# Patient Record
Sex: Female | Born: 1935
Health system: Southern US, Community
[De-identification: ages and names within clinical notes are randomized; demographics above are authoritative.]

## PROBLEM LIST (undated history)

## (undated) DIAGNOSIS — I639 Cerebral infarction, unspecified: Secondary | ICD-10-CM

## (undated) DIAGNOSIS — M1711 Unilateral primary osteoarthritis, right knee: Secondary | ICD-10-CM

## (undated) DIAGNOSIS — F0390 Unspecified dementia without behavioral disturbance: Secondary | ICD-10-CM

## (undated) DIAGNOSIS — I10 Essential (primary) hypertension: Secondary | ICD-10-CM

## (undated) DIAGNOSIS — M1990 Unspecified osteoarthritis, unspecified site: Secondary | ICD-10-CM

## (undated) DIAGNOSIS — Q2112 Patent foramen ovale: Secondary | ICD-10-CM

## (undated) DIAGNOSIS — Q211 Atrial septal defect: Secondary | ICD-10-CM

## (undated) HISTORY — PX: CARPAL TUNNEL RELEASE: SHX101

## (undated) HISTORY — PX: BREAST SURGERY: SHX581

## (undated) HISTORY — PX: KNEE ARTHROSCOPY: SUR90

---

## 1999-08-30 ENCOUNTER — Ambulatory Visit (HOSPITAL_COMMUNITY): Admission: RE | Admit: 1999-08-30 | Discharge: 1999-08-30 | Payer: Self-pay | Admitting: Internal Medicine

## 1999-08-30 ENCOUNTER — Encounter: Payer: Self-pay | Admitting: Obstetrics and Gynecology

## 1999-09-30 ENCOUNTER — Other Ambulatory Visit: Admission: RE | Admit: 1999-09-30 | Discharge: 1999-09-30 | Payer: Self-pay | Admitting: Obstetrics and Gynecology

## 1999-10-11 ENCOUNTER — Encounter: Admission: RE | Admit: 1999-10-11 | Discharge: 1999-10-11 | Payer: Self-pay | Admitting: Obstetrics and Gynecology

## 1999-10-11 ENCOUNTER — Encounter: Payer: Self-pay | Admitting: Obstetrics and Gynecology

## 2002-03-24 ENCOUNTER — Encounter: Payer: Self-pay | Admitting: Internal Medicine

## 2002-03-24 ENCOUNTER — Ambulatory Visit (HOSPITAL_COMMUNITY): Admission: RE | Admit: 2002-03-24 | Discharge: 2002-03-24 | Payer: Self-pay | Admitting: Internal Medicine

## 2002-04-01 ENCOUNTER — Encounter: Payer: Self-pay | Admitting: Internal Medicine

## 2002-04-01 ENCOUNTER — Encounter: Admission: RE | Admit: 2002-04-01 | Discharge: 2002-04-01 | Payer: Self-pay | Admitting: Internal Medicine

## 2003-01-10 ENCOUNTER — Inpatient Hospital Stay (HOSPITAL_COMMUNITY): Admission: EM | Admit: 2003-01-10 | Discharge: 2003-01-11 | Payer: Self-pay | Admitting: *Deleted

## 2003-01-10 ENCOUNTER — Encounter: Payer: Self-pay | Admitting: *Deleted

## 2004-03-11 ENCOUNTER — Ambulatory Visit (HOSPITAL_COMMUNITY): Admission: RE | Admit: 2004-03-11 | Discharge: 2004-03-11 | Payer: Self-pay | Admitting: Internal Medicine

## 2004-03-14 ENCOUNTER — Inpatient Hospital Stay (HOSPITAL_COMMUNITY): Admission: EM | Admit: 2004-03-14 | Discharge: 2004-03-16 | Payer: Self-pay | Admitting: Emergency Medicine

## 2004-04-02 ENCOUNTER — Encounter: Admission: RE | Admit: 2004-04-02 | Discharge: 2004-04-02 | Payer: Self-pay | Admitting: Internal Medicine

## 2005-01-02 ENCOUNTER — Ambulatory Visit (HOSPITAL_COMMUNITY): Admission: RE | Admit: 2005-01-02 | Discharge: 2005-01-02 | Payer: Self-pay | Admitting: Internal Medicine

## 2005-12-02 ENCOUNTER — Encounter: Admission: RE | Admit: 2005-12-02 | Discharge: 2005-12-02 | Payer: Self-pay | Admitting: Internal Medicine

## 2006-02-02 ENCOUNTER — Encounter: Admission: RE | Admit: 2006-02-02 | Discharge: 2006-02-02 | Payer: Self-pay | Admitting: Orthopedic Surgery

## 2006-02-04 ENCOUNTER — Ambulatory Visit (HOSPITAL_BASED_OUTPATIENT_CLINIC_OR_DEPARTMENT_OTHER): Admission: RE | Admit: 2006-02-04 | Discharge: 2006-02-04 | Payer: Self-pay | Admitting: Orthopedic Surgery

## 2009-01-04 ENCOUNTER — Encounter: Admission: RE | Admit: 2009-01-04 | Discharge: 2009-01-04 | Payer: Self-pay | Admitting: Internal Medicine

## 2009-01-10 ENCOUNTER — Encounter: Admission: RE | Admit: 2009-01-10 | Discharge: 2009-01-10 | Payer: Self-pay | Admitting: Internal Medicine

## 2009-12-01 ENCOUNTER — Emergency Department (HOSPITAL_COMMUNITY)
Admission: EM | Admit: 2009-12-01 | Discharge: 2009-12-01 | Payer: Self-pay | Source: Home / Self Care | Admitting: Emergency Medicine

## 2010-08-05 ENCOUNTER — Encounter: Admission: RE | Admit: 2010-08-05 | Discharge: 2010-08-05 | Payer: Self-pay | Admitting: Internal Medicine

## 2010-11-10 ENCOUNTER — Encounter: Payer: Self-pay | Admitting: Internal Medicine

## 2011-02-01 ENCOUNTER — Emergency Department (HOSPITAL_COMMUNITY): Payer: Medicare Other

## 2011-02-01 ENCOUNTER — Inpatient Hospital Stay (HOSPITAL_COMMUNITY)
Admission: EM | Admit: 2011-02-01 | Discharge: 2011-02-06 | DRG: 062 | Disposition: A | Discharge status: Home / Self Care | Payer: Medicare Other | Attending: Neurosurgery | Admitting: Neurosurgery

## 2011-02-01 ENCOUNTER — Encounter (HOSPITAL_COMMUNITY): Payer: Self-pay | Admitting: Radiology

## 2011-02-01 DIAGNOSIS — R29898 Other symptoms and signs involving the musculoskeletal system: Secondary | ICD-10-CM | POA: Diagnosis present

## 2011-02-01 DIAGNOSIS — R4701 Aphasia: Secondary | ICD-10-CM | POA: Diagnosis present

## 2011-02-01 DIAGNOSIS — M79609 Pain in unspecified limb: Secondary | ICD-10-CM | POA: Diagnosis not present

## 2011-02-01 DIAGNOSIS — I6340 Cerebral infarction due to embolism of unspecified cerebral artery: Principal | ICD-10-CM | POA: Diagnosis present

## 2011-02-01 DIAGNOSIS — Q2111 Secundum atrial septal defect: Secondary | ICD-10-CM

## 2011-02-01 DIAGNOSIS — I10 Essential (primary) hypertension: Secondary | ICD-10-CM | POA: Diagnosis present

## 2011-02-01 DIAGNOSIS — N390 Urinary tract infection, site not specified: Secondary | ICD-10-CM | POA: Diagnosis present

## 2011-02-01 DIAGNOSIS — F0390 Unspecified dementia without behavioral disturbance: Secondary | ICD-10-CM | POA: Diagnosis present

## 2011-02-01 DIAGNOSIS — Z79899 Other long term (current) drug therapy: Secondary | ICD-10-CM

## 2011-02-01 DIAGNOSIS — Q211 Atrial septal defect: Secondary | ICD-10-CM

## 2011-02-01 DIAGNOSIS — E041 Nontoxic single thyroid nodule: Secondary | ICD-10-CM | POA: Diagnosis present

## 2011-02-01 HISTORY — DX: Essential (primary) hypertension: I10

## 2011-02-01 LAB — URINE MICROSCOPIC-ADD ON

## 2011-02-01 LAB — PROTIME-INR: Prothrombin Time: 12.4 seconds (ref 11.6–15.2)

## 2011-02-01 LAB — POCT I-STAT, CHEM 8
BUN: 15 mg/dL (ref 6–23)
Calcium, Ion: 1.16 mmol/L (ref 1.12–1.32)
Creatinine, Ser: 1 mg/dL (ref 0.4–1.2)
HCT: 40 % (ref 36.0–46.0)
Hemoglobin: 13.6 g/dL (ref 12.0–15.0)
Potassium: 3.5 mEq/L (ref 3.5–5.1)

## 2011-02-01 LAB — DIFFERENTIAL
Basophils Absolute: 0 10*3/uL (ref 0.0–0.1)
Basophils Relative: 0 % (ref 0–1)
Eosinophils Absolute: 0.1 10*3/uL (ref 0.0–0.7)
Eosinophils Relative: 1 % (ref 0–5)
Lymphocytes Relative: 14 % (ref 12–46)
Lymphs Abs: 2 10*3/uL (ref 0.7–4.0)
Neutrophils Relative %: 77 % (ref 43–77)

## 2011-02-01 LAB — COMPREHENSIVE METABOLIC PANEL
AST: 31 U/L (ref 0–37)
Albumin: 4 g/dL (ref 3.5–5.2)
CO2: 24 mEq/L (ref 19–32)
Calcium: 9.3 mg/dL (ref 8.4–10.5)
Chloride: 107 mEq/L (ref 96–112)
Potassium: 3.5 mEq/L (ref 3.5–5.1)

## 2011-02-01 LAB — URINALYSIS, ROUTINE W REFLEX MICROSCOPIC
Nitrite: NEGATIVE
Protein, ur: 30 mg/dL — AB
Specific Gravity, Urine: 1.024 (ref 1.005–1.030)
Urobilinogen, UA: 0.2 mg/dL (ref 0.0–1.0)

## 2011-02-01 LAB — CK TOTAL AND CKMB (NOT AT ARMC)
Relative Index: INVALID (ref 0.0–2.5)
Total CK: 51 U/L (ref 7–177)

## 2011-02-01 LAB — CBC
HCT: 37.7 % (ref 36.0–46.0)
MCHC: 33.2 g/dL (ref 30.0–36.0)
RBC: 4.19 MIL/uL (ref 3.87–5.11)
RDW: 14.3 % (ref 11.5–15.5)
WBC: 14.2 10*3/uL — ABNORMAL HIGH (ref 4.0–10.5)

## 2011-02-01 LAB — APTT: aPTT: 30 seconds (ref 24–37)

## 2011-02-01 LAB — GLUCOSE, CAPILLARY: Glucose-Capillary: 139 mg/dL — ABNORMAL HIGH (ref 70–99)

## 2011-02-01 LAB — TROPONIN I: Troponin I: 0.03 ng/mL (ref 0.00–0.06)

## 2011-02-01 MED ORDER — IOHEXOL 350 MG/ML SOLN
50.0000 mL | Freq: Once | INTRAVENOUS | Status: AC | PRN
  Administered 2011-02-01: 50 mL via INTRAVENOUS

## 2011-02-02 ENCOUNTER — Inpatient Hospital Stay (HOSPITAL_COMMUNITY): Payer: Medicare Other

## 2011-02-02 LAB — LIPID PANEL
Cholesterol: 113 mg/dL (ref 0–200)
HDL: 55 mg/dL (ref >39)
LDL Cholesterol: 51 mg/dL (ref 0–99)
Total CHOL/HDL Ratio: 2.1 RATIO
Triglycerides: 33 mg/dL (ref <150)
VLDL: 7 mg/dL (ref 0–40)

## 2011-02-02 LAB — HEMOGLOBIN A1C: Mean Plasma Glucose: 123 mg/dL — ABNORMAL HIGH (ref <117)

## 2011-02-02 LAB — GLUCOSE, CAPILLARY: Glucose-Capillary: 125 mg/dL — ABNORMAL HIGH (ref 70–99)

## 2011-02-03 ENCOUNTER — Inpatient Hospital Stay (HOSPITAL_COMMUNITY): Payer: Medicare Other

## 2011-02-03 DIAGNOSIS — I6350 Cerebral infarction due to unspecified occlusion or stenosis of unspecified cerebral artery: Secondary | ICD-10-CM

## 2011-02-03 LAB — GLUCOSE, CAPILLARY: Glucose-Capillary: 102 mg/dL — ABNORMAL HIGH (ref 70–99)

## 2011-02-03 LAB — VITAMIN B12: Vitamin B-12: 309 pg/mL (ref 211–911)

## 2011-02-03 LAB — TSH: TSH: 6.941 u[IU]/mL — ABNORMAL HIGH (ref 0.350–4.500)

## 2011-02-04 LAB — GLUCOSE, CAPILLARY
Glucose-Capillary: 154 mg/dL — ABNORMAL HIGH (ref 70–99)
Glucose-Capillary: 105 mg/dL — ABNORMAL HIGH (ref 70–99)
Glucose-Capillary: 85 mg/dL (ref 70–99)
Glucose-Capillary: 115 mg/dL — ABNORMAL HIGH (ref 70–99)

## 2011-02-05 ENCOUNTER — Inpatient Hospital Stay (HOSPITAL_COMMUNITY): Payer: Medicare Other

## 2011-02-05 DIAGNOSIS — M79609 Pain in unspecified limb: Secondary | ICD-10-CM

## 2011-02-05 DIAGNOSIS — I6789 Other cerebrovascular disease: Secondary | ICD-10-CM

## 2011-02-05 LAB — GLUCOSE, CAPILLARY
Glucose-Capillary: 117 mg/dL — ABNORMAL HIGH (ref 70–99)
Glucose-Capillary: 111 mg/dL — ABNORMAL HIGH (ref 70–99)

## 2011-02-06 LAB — GLUCOSE, CAPILLARY
Glucose-Capillary: 137 mg/dL — ABNORMAL HIGH (ref 70–99)
Glucose-Capillary: 106 mg/dL — ABNORMAL HIGH (ref 70–99)
Glucose-Capillary: 81 mg/dL (ref 70–99)

## 2011-02-12 NOTE — H&P (Signed)
  [name], Cathy Johnson              ACCOUNT NO.:  [id number]  MEDICAL RECORD NO.:  [id number]           PATIENT TYPE:  I  LOCATION:  3108                         FACILITY:  [facility]  PHYSICIAN:  Thana Farr, MD    DATE OF BIRTH:  December 14, 1927  DATE OF ADMISSION:  02/01/2011 DATE OF DISCHARGE:                             HISTORY & PHYSICAL   ADDENDUM  The patient was critically ill on presentation and had high probability of further neurologic decompensation.  High-level decision making was required in the care of the patient.  Two hours was spent in face-to- face management and care of this patient.          ______________________________ Thana Farr, MD     LR/MEDQ  D:  02/01/2011  T:  02/01/2011  Job:  [id number]  Electronically Signed by Thana Farr MD on 02/12/2011 06:16:44 PM

## 2011-02-12 NOTE — Consult Note (Signed)
Cathy Johnson, Cathy Johnson              ACCOUNT NO.:  [id number]  MEDICAL RECORD NO.:  [id number]           PATIENT TYPE:  I  LOCATION:  3108                         FACILITY:  [facility]  PHYSICIAN:  Thana Farr, MD    DATE OF BIRTH:  April 26, 1928  DATE OF CONSULTATION:  02/01/2011 DATE OF DISCHARGE:                                CONSULTATION   HISTORY:  Cathy Johnson is an 77 year old female who this morning had an episode approximately of an hour with left-sided numbness.  Symptoms resolved.  The patient did well until approximately 1:30 this afternoon when she was talking with her husband and had acute onset of difficulty with speech and left-sided numbness and weakness.  The patient was brought in for evaluation at that time.  Code stroke was called.  PAST MEDICAL HISTORY:  Hypertension and dementia.  MEDICATIONS:  Micardis, Vytorin, amlodipine, multivitamin, vitamin D, Namenda, Exelon patch  ALLERGIES:  No known drug allergies.  FAMILY HISTORY:  Hypertension.  SOCIAL HISTORY:  The patient is married.  She is a retired Engineer, civil (consulting).  There is no history of alcohol, tobacco, or illicit drug abuse.  PHYSICAL EXAMINATION:  VITAL SIGNS:  Blood pressure 153/88, heart rate 123, respiratory rate 20, temperature 100% on 2 liters nasal cannula. NEUROLOGIC:  On mental status exam, the patient is alert.  She can follow commands without difficulty.  She has an expressive aphasia.  On cranial nerve testing II, visual fields grossly intact.  Disks flat bilaterally.  III, IV, VI, extraocular movements intact.  V and VII, decrease in the right nasolabial fold with smile.  VIII, grossly intact. IX and X, positive gag.  XI, bilateral shoulder shrug.  XII, midline tongue extension.  On motor exam, the patient is 5/5 in the bilateral upper extremities.  No drift is noted.  The patient is 4+/5 in the left lower extremity.  She is 5/5 in the right lower extremity.  There is decreased pinprick and light touch  in the left upper extremity. Sensation is intact otherwise.  Deep tendon reflexes are 2+ throughout. Plantars are downgoing bilaterally.  On cerebellar testing, finger-to- nose and heel-to-shin intact.  LABORATORY DATA:  Sodium of 140, potassium 3.5, chloride 104, bicarb 24, BUN and creatinine 15 and 1.0 respectively, glucose 152.  White blood cell count 14.2, platelet count 282, hemoglobin/hematocrit 12.5 and 37.7 respectively.  PT, INR, PTT 12.4, 0.90, and 30 respectively.  CT shows no acute changes.  No hemorrhage is noted.  CTA showed no evidence of significant stenosis.  ASSESSMENT:  Cathy Johnson is an 77 year old female who presents with acute difficulty with speech and left-sided weakness.  She had initial NIH score of 4.  Due to her significant speech deficits, she was given two- thirds t-PA.  CTA was ordered at that time.  No significant stenosis was noted on CTA.  The patient was still well within the time window.  The patient was given the last third of t-PA and admitted at that time.  PLAN: 1. The patient to be admitted to 3100 on pulse t-PA protocol. 2. SCDs for DVT prophylaxis. 3. Swallow screen to be performed. 4.  MRI of the brain.          ______________________________ Thana Farr, MD     LR/MEDQ  D:  02/01/2011  T:  02/02/2011  Job:  [id number]  Electronically Signed by Thana Farr MD on 02/12/2011 06:17:16 PM

## 2011-02-19 NOTE — Discharge Summary (Signed)
NAMELAQUIESHA, PIACENTE              ACCOUNT NO.:  [id number]  MEDICAL RECORD NO.:  [id number]           PATIENT TYPE:  I  LOCATION:  3003                         FACILITY:  [facility]  PHYSICIAN:  Levert Feinstein, MD          DATE OF BIRTH:  [birth year]  DATE OF ADMISSION:  02/01/2011 DATE OF DISCHARGE:  02/05/2011                              DISCHARGE SUMMARY   DIAGNOSES AT TIME OF DISCHARGE: 1. Left posterior frontal gyrus and left parietal gyrus infarct, felt     to be embolic source. 2. Newly identified patent foramen ovale. 3. Hypertension. 4. Baseline dementia. 5. Urinary tract infection. 6. Newly found left thyroid nodule. 7. Acute right foot pain.  MEDICINES AT TIME OF DISCHARGE: 1. Micardis 40 mg one tablet b.i.d. 2. Actonel 35 mg p.o. q. 7 days. 3. Namenda 10 mg b.i.d. 4. Amlodipine 5 mg a day. 5. Exelon 4.6 mg patch transdermally daily. 6. Vitamin D2 - 50,000 units one caplet every 7 days. 7. Cipro 500 mg b.i.d. for a total of 5 days, last dose scheduled for     Mar 08, 2011, in the evening. 8. Aspirin 325 mg a day.  STUDIES PERFORMED: 1. CT of the brain on admission shows no acute abnormality, atrophy     and small vessel ischemic changes, chronic right cerebellar     hemisphere encephalomalacia. 2. CT angio of the neck shows no significant vascular disease.  A 2.7-     cm dominant nodule in the lower pole of the thyroid on the left.     Ultrasound recommended. 3. CT angio of the head normal with no sign of brain infarct or     stenosis or occlusion. 4. MRI of the brain shows two foci of acute infarct.  A 1-2 cm region     of infarct affecting the left posterior frontal gyrus and a     punctate infarct affecting the left parietal gyrus.  Small vessel     disease elsewhere throughout the brain. 5. Ultrasound of the neck shows a 2.5 cm dominant left thyroid nodule.     Biopsy recommended to exclude neoplasm. 6. A 2-D echocardiogram has been ordered, there is though no     documentation has been completed. 7. Carotid Doppler shows no ICA stenosis.  Transcranial Doppler     performed, results pending. 8. A TEE was done by Dr. Shirlee Latch shows an EF of 60% with trivial     tricuspid regurgitation, mild; PI; positive PFO; no thrombus;     positive bubble study. 9. EKG shows normal sinus rhythm with sinus arrhythmia. 10.Right foot x-ray negative for fracture. 11.Bilateral lower extremity Dopplers negative for DVT.   LABORATORY STUDIES:  Vitamin B12 is 309.  TSH 6.941.  Hemoglobin A1c 5.9.  Cholesterol 113, triglycerides 33, HDL 55, LDL 51.  Urinalysis with 21-51 red blood cells and 21-50 white blood cells, many bacteria. MRSA screening is negative.  White blood cells 14.2, neutrophils 10.9. Chemistry with glucose 156, otherwise normal.  Liver function tests normal.  Coagulation studies normal.  HISTORY OF PRESENT ILLNESS:  Ms. Sylvain is an  77 year old right-handed Caucasian female who had sudden onset of left-sided numbness the morning of admission.  Symptoms resolved. She did well until approximately 1:30 p.m. when she was talking with her husband and then had acute onset of difficulty with speech and left-sided numbness and weakness.  She  was brought in for evaluation at that time.  Code stroke was called en route.  The patient was felt to be a tPA candidate.  She was initially given two thirds of tPA.  CT angio was performed which showed no significant stenosis, so as the patient was well within the time window, was given the additional one-third tPA for a total  full-dose IV tPA.  She was admitted to the neuro ICU for further evaluation.  HOSPITAL COURSE:  The patient tolerated tPA without difficulty.  Post tPA imaging showed no acute hemorrhage.  The patient did have two small infarcts, one in the left posterofrontal gyrus and one in the left parietal gyrus.  Infarcts were felt to be embolic in nature.  TEE was performed which did reveal a PFO.  Lower  extremity Dopplers were negative for deep vein thrombosis. Incidental finding during this hospitalization was a 2.7-cm left thyroid nodule, which Dr. Waynard Edwards will follow with an outpatient biopsy in 1-2 weeks.  The patient also had right foot pain with x-ray negative for fracture. The patient was placed on aspirin for secondary stroke prevention. At baseline, she does have dementia and lives with her husband.  He feels he is able to provide care at discharge; herefore, she will be discharged home with him.  Discharge was scheduled for February 05, 2011, though her husband felt uncomfortable taking her home at 5 p.m. as he had an appointment the next day. On April 19 during rounds patient was found to have new mild left upper extremity hemiparesis. PT re-evaluated her and recommended out patient OT.  CONDITION AT TIME OF DISCHARGE:  The patient is awake, alert, and oriented to person and place.  She follows commands.  She has poor short- term memory, poor recall though her social speech is intact.  Her heart rate is regular.  Her breath sounds are clear.  Cranial nerves are intact.  Her strength is 5/5.  She does have right inner foot pain, and her deep tendon reflexes are hyporeflexive, right less than left. Plantars are down bilaterally.  DISCHARGE PLAN: 1. Discharge home with husband. 2. Aspirin for secondary stroke prevention. 3. Outpatient occupational therapy. 4. Follow up with Dr. Waynard Edwards in 1-2 weeks. 5. Dr. Waynard Edwards is to arrange thyroid biopsy. 6. Follow up with Dr. Pearlean Brownie in 1-26-months at the [facility].     Annie Main, N.P.   ______________________________ Levert Feinstein, MD    SB/MEDQ  D:  02/05/2011  T:  02/06/2011  Job:  [id number]  cc:   Pramod P. Pearlean Brownie, MD [facility] G[facility] Perini, M.D.  Electronically Signed by Annie Main N.P. on 02/06/2011 03:35:06 PM Electronically Signed by Levert Feinstein MD on 02/19/2011 09:42:47 AM

## 2011-02-27 ENCOUNTER — Ambulatory Visit: Payer: Medicare Other | Attending: Neurology | Admitting: Occupational Therapy

## 2011-02-27 DIAGNOSIS — I69919 Unspecified symptoms and signs involving cognitive functions following unspecified cerebrovascular disease: Secondary | ICD-10-CM | POA: Insufficient documentation

## 2011-02-27 DIAGNOSIS — R482 Apraxia: Secondary | ICD-10-CM | POA: Insufficient documentation

## 2011-02-27 DIAGNOSIS — M6281 Muscle weakness (generalized): Secondary | ICD-10-CM | POA: Insufficient documentation

## 2011-02-27 DIAGNOSIS — I69998 Other sequelae following unspecified cerebrovascular disease: Secondary | ICD-10-CM | POA: Insufficient documentation

## 2011-02-27 DIAGNOSIS — R279 Unspecified lack of coordination: Secondary | ICD-10-CM | POA: Insufficient documentation

## 2011-02-27 DIAGNOSIS — Z5189 Encounter for other specified aftercare: Secondary | ICD-10-CM | POA: Insufficient documentation

## 2011-03-03 ENCOUNTER — Ambulatory Visit: Payer: Medicare Other | Admitting: Occupational Therapy

## 2011-03-04 ENCOUNTER — Ambulatory Visit: Payer: Medicare Other

## 2011-03-07 NOTE — Op Note (Signed)
NAMEDARRIN, APODACA              ACCOUNT NO.:  [id number]   MEDICAL RECORD NO.:  [id number]          PATIENT TYPE:  AMB   LOCATION:  DSC                          FACILITY:  [facility]   PHYSICIAN:  Mila Homer. Sherlean Foot, M.D. DATE OF BIRTH:  11/16/1927   DATE OF PROCEDURE:  02/04/2006  DATE OF DISCHARGE:                                 OPERATIVE REPORT   SURGEON:  Mila Homer. Sherlean Foot, M.D.   ASSISTANT:  None.   ANESTHESIA:  MAC.   PREOPERATIVE DIAGNOSIS:  Right knee medial and lateral meniscus tears.   POSTOPERATIVE DIAGNOSES:  1.  Right knee medial and lateral meniscus tears.  2.  Plica syndrome.   PROCEDURE:  Right knee arthroscopy with partial medial and lateral  meniscectomies and plica resection.   INDICATION FOR PROCEDURE:  The patient is a 77 year old with mechanical  symptoms, MRI evidence of meniscus tears.  Informed consent obtained.   DESCRIPTION OF PROCEDURE:  The patient was laid supine and administered MAC  anesthesia.  The right leg was prepped and draped in the usual sterile  fashion.  Inferolateral and inferomedial portals were created with a #11  blade, blunt trocar and cannula.  Diagnostic arthroscopy revealed minimal  chondromalacia in the patellofemoral joint, grade 3 over much of the medial  femoral condyle, posterior horn medial meniscus tears.  I performed a  chondroplasty with the great white shaver, then performed a partial medial  meniscectomy with the great white shaver and straight basket forceps.  ACL  and PCL were normal.  Then I went into the lateral compartment in the figure-  of-four position.  There was evidence of a discoid meniscus with tears.  I  performed a saucerization with the straight basket and angled basket forceps  and great white shaver.  I then lavaged the knee, closed with 4-0 nylon  sutures, dressed with Xeroform dressing sponges, sterile Webril and an Ace  wrap.  I then infiltrated 10 mL of a Marcaine-morphine mixture in this   portal.   COMPLICATIONS:  None.   DRAINS:  None.   ESTIMATED BLOOD LOSS:  Minimal.          ______________________________  Mila Homer. Sherlean Foot, M.D.    SDL/MEDQ  D:  02/04/2006  T:  02/05/2006  Job:  [id number]

## 2011-03-07 NOTE — Discharge Summary (Signed)
NAME:  Cathy Johnson, NOVITSKY                        ACCOUNT NO.:  [id number]   MEDICAL RECORD NO.:  [id number]                   PATIENT TYPE:  INP   LOCATION:  3017                                 FACILITY:  [facility]   PHYSICIAN:  Deanna Artis. Sharene Skeans, M.D.           DATE OF BIRTH:  [birth year]   DATE OF ADMISSION:  03/14/2004  DATE OF DISCHARGE:  03/16/2004                                 DISCHARGE SUMMARY   FINAL DIAGNOSES:  1. Dysarthria, 784.5.  2. Dysphagia, 787.2, unknown etiology.  3. Hypokalemia.  4. Hypertension.   SUMMARY OF HOSPITALIZATION:  The patient is a 77 year old woman admitted  with sudden onset of swallowing problems and difficulty speaking.  She has  had a 15-year history of hypertension, temporal mandibular joint  dysfunction, cervical degenerative joint disease, and a benign breast  biopsy.   The patient was noted to have hypokalemia with a potassium of 3.1.  Mono  spot was negative.  The remainder of her CBC and basic metabolic panel was  normal, sedimentation rate was 36.  Differential diagnoses included stroke,  myasthenia gravis, and infectious processes such as Lyme disease or [place] Spotted Fever.   The patient had normal liver functions.   During the course of her hospitalization, her potassium was repleted up to  3.6.  Electrolytes remained stable.  She had a swallowing study which showed  mild dysphagia with trace penetration of thin liquid that cleared with  further swallows.  This was related to mild discoordination with the  swallow.  She had tertiary contractions that were noted in the esophagus  with dysmotility.  Speech therapy was recommended, regular consistency food  with thin fluids, aspiration precautions as well as reflux precautions.   The patient has done progressively well during the hospitalization and has  normal speech this morning, and has had no difficulty swallowing.  Her blood  pressures have been fairly stable throughout  the hospitalization in the 140  to 150/75 to 80 range.  She has been off Diovan and hydrochlorothiazide.   On examination today the sore throat which she complained of yesterday has  gone, she still has mild ear pain.  She has no other complaints.  Blood  pressure 145/78, resting pulse 72, respirations 20, temperature 98.2, O2  saturation 92% on room air.  Ear, nose and throat - tympanic membranes and  pharynx were unremarkable. Lungs were clear.  Heart - no murmurs, pulses  normal.  Abdomen soft, bowel sounds normal, no edema or cyanosis.  Neurologic examination - the patient was awake and alert, Cranial nerves  -  round, reactive pupils, visual fields full, symmetric facial strength,  midline tongue and uvula.  Motor examination - normal strength, tone and  _____  Fine motor movements, no drift.  Sensation intact to primary and  cortical modalities.  Cerebellar examination - good finger-to-nose, rapid  repetitive movements, gait not tested (she was walking well by history).  Deep tendon reflexes are diminished.   The patient is discharged in improved condition.  We will place her on  hydrochlorothiazide 25 mg 1/2 per day and K-Dur 10 mEq 1 t.i.d. with meals.  She is to follow up with her primary doctor, Dr. Waynard Edwards in the middle of the  week.  She will follow up with Dr. Sandria Manly as needed.   SUMMARY OF LABORATORY STUDIES:  Showed ANA which is pending.  Acetycholine  antibody receptor pending.  Initial laboratory studies:  Sodium 142,  potassium 3.1, chloride 105, CO2 27, glucose 121 (random), BUN 10,  creatinine 1.0, calcium 9.5, total protein 7.4, albumin 3.8.  AST 22, ALT  22, ALP 113, total bilirubin 0.7, creatinine kinase 53, CK-MB 0.6.  Mono  spot was negative.  Pending studies include [facility] Spotted Fever,  IGA and IGM, qualitative ANA.  Repeat basic metabolic panel:  Sodium 144,  potassium 3.6, chloride 106, CO2 26.  CBC:  White count 5700, hemoglobin  11.7, hematocrit  35.0, MCV 87.1, platelet count 423,000, neutrophils 62  (absolute neutrophil count 3600), lymphocytes 29, monos 6, eosinophils 3,  basophils 1.  PT 12.7, PTT 34.   MRI scan of the brain normal.  MRA intracranial normal.  EKG normal sinus  rhythm, low voltage QRS.   The patient is discharged in improved condition.                                                Deanna Artis. Sharene Skeans, M.D.    [facility]  D:  03/16/2004  T:  03/17/2004  Job:  [id number]   cc:   Loraine Leriche A. Waynard Edwards, M.D.  [address]  [place]  [place] [address]  Fax: [phone]

## 2011-03-07 NOTE — H&P (Signed)
NAME:  Cathy Johnson, Cathy Johnson NO.:  [id number]   MEDICAL RECORD NO.:  [id number]                   PATIENT TYPE:  EMS   LOCATION:  MAJO                                 FACILITY:  [facility]   PHYSICIAN:  Peter M. [place], M.D.               DATE OF BIRTH:  1928/03/01   DATE OF ADMISSION:  01/10/2003  DATE OF DISCHARGE:                                HISTORY & PHYSICAL   HISTORY OF PRESENT ILLNESS:  The patient is a very pleasant 77 year old  white female, retired Engineer, civil (consulting), who presents for evaluation of chest pain.  Her  initial episode of chest pain occurred approximately one week ago.  This was  described as severe substernal chest heaviness radiating down both arms.  The patient states she thought she was going to die.  Her symptoms resolved  after approximately 20 minutes.  She had no associated nausea, vomiting,  diaphoresis or shortness of breath.  She has had two more episodes of  similar chest pain this past week.  Yesterday evening, her pain again  recurred, this time lasting up to 40 minutes.  The patient decided to have  it checked out today, but is currently pain-free.  She has no known history  of cardiac disease or angina.  She has had no prior cardiac evaluation.   PAST MEDICAL HISTORY:  Past medical history is significant for hypertension.  She has a history of cervical disk disease.  She has had previous ovarian  cyst removal and previous breast lumpectomy in 1984 which was benign.  There  is no history of hypercholesterolemia.  The patient reports recent  cholesterol showed total cholesterol of 191 with a good ratio.  She has no  history of diabetes.   ALLERGIES:  No known allergies.   MEDICATIONS:  Diovan HCT 160/12.5 mg daily.   SOCIAL HISTORY:  The patient is married.  She has two children.  She is a  retired Engineer, civil (consulting).  She denies smoking or alcohol use.   FAMILY HISTORY:  Father died at age 59 with CVA.  Mother died at age 61.  One brother  has leukemia.   REVIEW OF SYSTEMS:  She denies any history of peptic ulcer disease,  gallstone disease or any bleeding problems.  No history of stroke or  claudication.  No orthopnea, PND or edema.  Denies any bowel or bladder  complaints.  She has had some cystitis in the past evaluated by Dr. Derrell Lolling. [place].  Other review of systems are negative.   PHYSICAL EXAMINATION:  GENERAL:  On physical exam, patient is a pleasant  white female in no apparent distress.  VITAL SIGNS:  Blood pressure is 150/76, pulse is 84, respirations are 20.  She is afebrile.  HEENT:  Pupils equal, round and reactive to light and accommodation.  Extraocular movements are full.  Oropharynx is clear.  NECK:  Neck is without JVD, adenopathy, thyromegaly  or bruits.  LUNGS:  Lungs are clear to auscultation and percussion.  CARDIAC:  Exam reveals regular rate and rhythm without murmurs, rubs,  gallops or clicks.  ABDOMEN:  Abdomen is soft and nontender.  Bowel sounds are positive.  EXTREMITIES:  Extremities are without edema.  Pulses are 2+ and symmetric  with a few varicosities.  NEUROLOGICAL:  Exam is intact.   LABORATORY DATA:  ECG shows normal sinus rhythm with poor R wave  progression, no acute ST-T wave changes.   Chest x-ray shows no active disease.   CPK, MB and troponin are initially negative.  Potassium is 3.4.  Hemoglobin  11.3.   IMPRESSION:  1. Crescendo angina by history.  2. Hypertension.  3. Cervical disk disease.  4. Hypokalemia.   PLAN:  The patient will be admitted to telemetry.  We will rule out  myocardial infarction.  She will be treated with aspirin, subcu Lovenox and  oral beta blocker.  We will continue her Diovan HCT.  We will potentially  plan on cardiac catheterization tomorrow morning.                                               Peter M. [place], M.D.    PMJ/MEDQ  D:  01/10/2003  T:  01/11/2003  Job:  [id number]   cc:   Loraine Leriche A. Waynard Edwards, M.D.  [address]   [place]  [place] [address]  Fax: [phone]

## 2011-03-07 NOTE — H&P (Signed)
NAME:  Cathy Johnson, Cathy Johnson                        ACCOUNT NO.:  [id number]   MEDICAL RECORD NO.:  [id number]                   PATIENT TYPE:  INP   LOCATION:  3017                                 FACILITY:  [facility]   PHYSICIAN:  Genene Churn. Love, M.D.                 DATE OF BIRTH:  08-31-1928   DATE OF ADMISSION:  03/14/2004  DATE OF DISCHARGE:                                HISTORY & PHYSICAL   This is one of several [facility]. [facility] admissions for this  77 year old right-handed married white female from [place], [place], admitted from the emergency room for evaluation of swallowing  problems and difficulty speaking of acute onset.   HISTORY OF PRESENT ILLNESS:  Ms. Cathy Johnson has a 15-year history of treated  hypertension but no known history of diabetes mellitus, heart disease or  history of stroke.  She has a positive family history for stroke.  On Mar 04, 2004, through Mar 06, 2004, she was admitted to [facility] in  [place], [place], for evaluation of recurrence of nausea and  vomiting, treated with Phenergan.  the results of her test at that time  according to the patient were normal.  Because her nausea and vomiting was  at one point thought to be projectile.  The question of an MRI study of the  brain was raised but not performed.  She received her last dose of Phenergan  on Mar 09, 2004, this past Saturday.  Over the last six days, she has had  some temperature with temperature of 100.2 degrees, stiffness and pain in  her neck, and the low back pain.  She was seen by Dr. Waynard Edwards on Mar 13, 2004. She was in her usual state of health until the morning of admission at  about 8:15 while eating breakfast, she had difficulty swallowing food, and  difficulty with speech.  There was no acute headache but she was having  difficulty with neck pain and she was brought to [facility] Emergency Room  where code stroke was called.  She had received  Benadryl 25 mg IV in the  emergency room for the question of oculogyric dystonic reaction to the  previous doses of Phenergan.  The patient denies any history of head or neck  trauma, tick exposure, rash, eating home canned or preserved foods, double  vision, hearing change, numbness, focal weakness, chest pain, palpitations,  focal arm or leg weakness, recurrent nausea or vomiting, recurrent diarrhea,  etc.  She does have a known prior history of neck pain with four herniated  nucleus pulposus in her neck followed by Dr. Alanson Aly. Roxan Hockey,  neurosurgeon in [place], [place].   ALLERGIES:  No known drug allergies.   MEDICATIONS:  1. Diovan 160 mg daily.  2. Hydrochlorothiazide 12.5 mg daily.  3. No aspirin.   PAST MEDICAL HISTORY:  1. Hypertension for 15 years.  2. History of right TM joint problems.  3. Cervical DJD, multilevel.  4. Left breast biopsy 20 years ago.   SOCIAL HISTORY:  She is a Designer, jewellery.  She does not smoke cigarettes.  Does not drink alcohol.  She is a former Designer, jewellery who worked at  [employer]. [employer] [employer].   FAMILY HISTORY:  Her mother died at 20 from a stroke.  Her father died at  age 66 from a stroke.  She has a sister 48, sister 54, brother 90, brother  55 and brother 24 living and well.  The patient has two children, a son 61  and a son 56 living and well.   PHYSICAL EXAMINATION:  GENERAL APPEARANCE:  A well-developed white female.  VITAL SIGNS:  Blood pressure lying right and left arm 150/80, heart rate 69  and regular, respiratory rate 18, temperature 98.8 degrees.  HEENT:  Tympanic membranes clear.  Mouth was in good repair.  Gags were  present.  Uvula was midline.  Hearing was decreased on the right and left.  Pupils were equal.  NECK:  Stiff with extension and turning her head to the left and right.  She  had tenderness in her neck with some enlarged posterior lymph nodes.  LUNGS:  Clear to auscultation.   CARDIOVASCULAR:  No murmurs.  ABDOMEN:  Bowel sounds were normal with some tenderness in the right upper  quadrant. There is no enlargement of the liver, spleen or kidneys.  EXTREMITIES:  There was no clubbing, cyanosis, or edema.  NEUROLOGIC:  Mental status:  She was alert and oriented x3.  She followed  one, two, and three-step commands.  She could name objects.  She could  repeat phrases.  She spoke with low volume voice and had a dysarthria.  Cranial nerve examination revealed she could open her mouth.  She had no  tenderness in her right jaw.  Discs were flat.  The extraocular movements  were full.  Corneals were present.  There was questionable decrease in the  right nasal labial fold.  Tongue was midline.  Uvula was midline.  Gags were  present.  Sternocleidomastoid and trapezius testing were normal.  Motor  examination revealed 5/5 strength in the upper and lower extremities.  She  had finger-to-nose and good heel-to-shin.  Rapid alternating movement skills  were normal.  Sensory examination was intact to pinprick, touch, joint  position and vibration testing.  Deep tendon reflexes were 2+.  Plantar  responses were downgoing.   IMPRESSION:  1. Dysarthria, code 784.5.  Dysphagia, code 787.2.  Consider the possibility     of brain stem lesion.  Consider the possibility of side effect from Phenergan use.  Consider the  possibility of febrile illness as a cause.  1. Febrile illness recently, code 780.6.  2. Stiff neck, code 733.1, with cervical degenerative joint disease, code     722.4.  3. Hypertension, code 796.2.  4. History of right temporomandibular joint pain, code 350.2.   PLAN:  Admit, obtain CBC, obtain metabolic pain, sed rate, [place]  spotted fever, EKG, MRI, MRA intracranial and extracranial.                                                Genene Churn. Sandria Manly, M.D.    JML/MEDQ  D:  03/14/2004  T:  03/15/2004  Job:  [id number]   cc:   Loraine Leriche A. Waynard Edwards, M.D. [address]  [place]  [place] [address]  Fax: [phone]

## 2011-03-07 NOTE — Cardiovascular Report (Signed)
   NAME:  Cathy Johnson, Cathy Johnson NO.:  [id number]   MEDICAL RECORD NO.:  [id number]                   PATIENT TYPE:  INP   LOCATION:  3712                                 FACILITY:  [facility]   PHYSICIAN:  Peter M. Swaziland, M.D.               DATE OF BIRTH:  August 30, 1928   DATE OF PROCEDURE:  01/11/2003  DATE OF DISCHARGE:                              CARDIAC CATHETERIZATION   INDICATIONS FOR PROCEDURE:  The patient is a 77 year old white female with a  history of hypertension and hypercholesterolemia who presents for symptoms  consistent with crescendo angina.   ACCESS:  Via the right femoral artery using standard Seldinger technique.   EQUIPMENT:  Equipment used was 6-French 4-cm right and left Judkins  catheters, 6-French pigtail catheter, 6-French arterial sheath.   MEDICATIONS:  Local anesthesia, 1% Xylocaine, Versed 2 mg IV.   CONTRAST:  Omnipaque,  130 cc.   HEMODYNAMIC DATA:  1. Aortic pressure was 131/69 with a mean of 95.  2. Left ventricular pressure was 138 with an EDP of 17 mmHg.   ANGIOGRAPHIC DATA:  1. The left coronary artery arises normally.  2. There is no significant left main coronary artery with a shared coronary     ostia for both the LAD and left circumflex coronary artery.  Best images     were obtained with subselection of each vessel.  3. The left anterior descending artery is tortuous in the mid vessel.  In     the RAO caudal view only, there appears to be some haziness in the mid     LAD, but in other views, this appears to be an area of tortuosity and a     bend in the vessel.  There is no plaque noted throughout the vessel.  4. The left circumflex coronary artery appears normal.  5. The right coronary artery is normal.  6. Left ventricular angiography demonstrates normal left ventricular size     and contractility with normal systolic function.  Ejection fraction is     estimated at 65%.    FINAL INTERPRETATION:  1. Normal  coronary anatomy.  2. Normal left ventricular function.                                               Peter M. Swaziland, M.D.    PMJ/MEDQ  D:  01/11/2003  T:  01/11/2003  Job:  [id number]   cc:   Loraine Leriche A. Waynard Edwards, M.D.  [address]  [place]  [place] [address]  Fax: [phone]

## 2011-03-13 ENCOUNTER — Ambulatory Visit: Payer: Medicare Other | Admitting: *Deleted

## 2011-03-20 ENCOUNTER — Ambulatory Visit: Payer: Medicare Other

## 2011-03-20 ENCOUNTER — Ambulatory Visit: Payer: Medicare Other | Admitting: Occupational Therapy

## 2011-03-26 ENCOUNTER — Ambulatory Visit: Payer: Medicare Other | Admitting: Occupational Therapy

## 2011-03-26 ENCOUNTER — Ambulatory Visit: Payer: Medicare Other | Attending: Neurology

## 2011-03-26 DIAGNOSIS — M6281 Muscle weakness (generalized): Secondary | ICD-10-CM | POA: Insufficient documentation

## 2011-03-26 DIAGNOSIS — R482 Apraxia: Secondary | ICD-10-CM | POA: Insufficient documentation

## 2011-03-26 DIAGNOSIS — I69998 Other sequelae following unspecified cerebrovascular disease: Secondary | ICD-10-CM | POA: Insufficient documentation

## 2011-03-26 DIAGNOSIS — R279 Unspecified lack of coordination: Secondary | ICD-10-CM | POA: Insufficient documentation

## 2011-03-26 DIAGNOSIS — I69919 Unspecified symptoms and signs involving cognitive functions following unspecified cerebrovascular disease: Secondary | ICD-10-CM | POA: Insufficient documentation

## 2011-03-26 DIAGNOSIS — Z5189 Encounter for other specified aftercare: Secondary | ICD-10-CM | POA: Insufficient documentation

## 2011-03-28 ENCOUNTER — Ambulatory Visit: Payer: Medicare Other

## 2011-03-28 ENCOUNTER — Ambulatory Visit: Payer: Medicare Other | Admitting: Occupational Therapy

## 2011-03-31 ENCOUNTER — Ambulatory Visit: Payer: Medicare Other

## 2011-03-31 ENCOUNTER — Encounter: Payer: Medicare Other | Admitting: Occupational Therapy

## 2011-04-02 ENCOUNTER — Ambulatory Visit: Payer: Medicare Other | Admitting: Occupational Therapy

## 2011-04-07 ENCOUNTER — Ambulatory Visit: Payer: Medicare Other | Admitting: Occupational Therapy

## 2011-04-09 ENCOUNTER — Ambulatory Visit: Payer: Medicare Other | Admitting: Occupational Therapy

## 2011-04-14 ENCOUNTER — Encounter: Payer: Medicare Other | Admitting: Occupational Therapy

## 2011-04-16 ENCOUNTER — Encounter: Payer: Medicare Other | Admitting: Occupational Therapy

## 2011-04-21 ENCOUNTER — Encounter: Payer: Medicare Other | Admitting: Occupational Therapy

## 2011-04-24 ENCOUNTER — Encounter: Payer: Medicare Other | Admitting: Occupational Therapy

## 2011-07-29 ENCOUNTER — Other Ambulatory Visit: Payer: Self-pay | Admitting: Internal Medicine

## 2011-07-29 DIAGNOSIS — Z1231 Encounter for screening mammogram for malignant neoplasm of breast: Secondary | ICD-10-CM

## 2011-08-13 ENCOUNTER — Ambulatory Visit
Admission: RE | Admit: 2011-08-13 | Discharge: 2011-08-13 | Disposition: A | Discharge status: Home / Self Care | Payer: Medicare Other | Source: Ambulatory Visit | Attending: Internal Medicine | Admitting: Internal Medicine

## 2011-08-13 DIAGNOSIS — Z1231 Encounter for screening mammogram for malignant neoplasm of breast: Secondary | ICD-10-CM

## 2011-10-22 DIAGNOSIS — F068 Other specified mental disorders due to known physiological condition: Secondary | ICD-10-CM | POA: Diagnosis not present

## 2011-10-22 DIAGNOSIS — I6340 Cerebral infarction due to embolism of unspecified cerebral artery: Secondary | ICD-10-CM | POA: Diagnosis not present

## 2011-11-11 DIAGNOSIS — I6340 Cerebral infarction due to embolism of unspecified cerebral artery: Secondary | ICD-10-CM | POA: Diagnosis not present

## 2011-11-20 DIAGNOSIS — M6530 Trigger finger, unspecified finger: Secondary | ICD-10-CM | POA: Diagnosis not present

## 2011-12-03 DIAGNOSIS — M6530 Trigger finger, unspecified finger: Secondary | ICD-10-CM | POA: Diagnosis not present

## 2011-12-03 DIAGNOSIS — G5600 Carpal tunnel syndrome, unspecified upper limb: Secondary | ICD-10-CM | POA: Diagnosis not present

## 2012-01-26 DIAGNOSIS — F068 Other specified mental disorders due to known physiological condition: Secondary | ICD-10-CM | POA: Diagnosis not present

## 2012-01-26 DIAGNOSIS — I6350 Cerebral infarction due to unspecified occlusion or stenosis of unspecified cerebral artery: Secondary | ICD-10-CM | POA: Diagnosis not present

## 2012-01-26 DIAGNOSIS — I6340 Cerebral infarction due to embolism of unspecified cerebral artery: Secondary | ICD-10-CM | POA: Diagnosis not present

## 2012-06-02 DIAGNOSIS — H2510 Age-related nuclear cataract, unspecified eye: Secondary | ICD-10-CM | POA: Diagnosis not present

## 2012-06-02 DIAGNOSIS — H5200 Hypermetropia, unspecified eye: Secondary | ICD-10-CM | POA: Diagnosis not present

## 2012-06-30 DIAGNOSIS — I10 Essential (primary) hypertension: Secondary | ICD-10-CM | POA: Diagnosis not present

## 2012-06-30 DIAGNOSIS — E785 Hyperlipidemia, unspecified: Secondary | ICD-10-CM | POA: Diagnosis not present

## 2012-06-30 DIAGNOSIS — E559 Vitamin D deficiency, unspecified: Secondary | ICD-10-CM | POA: Diagnosis not present

## 2012-06-30 DIAGNOSIS — R82998 Other abnormal findings in urine: Secondary | ICD-10-CM | POA: Diagnosis not present

## 2012-06-30 DIAGNOSIS — R809 Proteinuria, unspecified: Secondary | ICD-10-CM | POA: Diagnosis not present

## 2012-07-08 DIAGNOSIS — Z23 Encounter for immunization: Secondary | ICD-10-CM | POA: Diagnosis not present

## 2012-07-08 DIAGNOSIS — Z0000 Encounter for general adult medical examination without abnormal findings: Secondary | ICD-10-CM | POA: Diagnosis not present

## 2012-07-08 DIAGNOSIS — F068 Other specified mental disorders due to known physiological condition: Secondary | ICD-10-CM | POA: Diagnosis not present

## 2012-07-08 DIAGNOSIS — Z124 Encounter for screening for malignant neoplasm of cervix: Secondary | ICD-10-CM | POA: Diagnosis not present

## 2012-07-08 DIAGNOSIS — I6350 Cerebral infarction due to unspecified occlusion or stenosis of unspecified cerebral artery: Secondary | ICD-10-CM | POA: Diagnosis not present

## 2012-07-08 DIAGNOSIS — E785 Hyperlipidemia, unspecified: Secondary | ICD-10-CM | POA: Diagnosis not present

## 2012-07-08 DIAGNOSIS — Z1212 Encounter for screening for malignant neoplasm of rectum: Secondary | ICD-10-CM | POA: Diagnosis not present

## 2012-07-12 ENCOUNTER — Other Ambulatory Visit: Payer: Self-pay | Admitting: Internal Medicine

## 2012-07-12 DIAGNOSIS — Z1231 Encounter for screening mammogram for malignant neoplasm of breast: Secondary | ICD-10-CM

## 2012-08-03 DIAGNOSIS — M899 Disorder of bone, unspecified: Secondary | ICD-10-CM | POA: Diagnosis not present

## 2012-08-13 ENCOUNTER — Ambulatory Visit
Admission: RE | Admit: 2012-08-13 | Discharge: 2012-08-13 | Disposition: A | Discharge status: Home / Self Care | Payer: Medicare Other | Source: Ambulatory Visit | Attending: Internal Medicine | Admitting: Internal Medicine

## 2012-08-13 DIAGNOSIS — Z1231 Encounter for screening mammogram for malignant neoplasm of breast: Secondary | ICD-10-CM | POA: Diagnosis not present

## 2012-11-24 ENCOUNTER — Emergency Department (INDEPENDENT_AMBULATORY_CARE_PROVIDER_SITE_OTHER): Payer: Medicare Other

## 2012-11-24 ENCOUNTER — Emergency Department (INDEPENDENT_AMBULATORY_CARE_PROVIDER_SITE_OTHER)
Admission: EM | Admit: 2012-11-24 | Discharge: 2012-11-24 | Disposition: A | Discharge status: Home / Self Care | Payer: Medicare Other | Source: Home / Self Care | Attending: Family Medicine | Admitting: Family Medicine

## 2012-11-24 ENCOUNTER — Encounter (HOSPITAL_COMMUNITY): Payer: Self-pay

## 2012-11-24 DIAGNOSIS — M25519 Pain in unspecified shoulder: Secondary | ICD-10-CM | POA: Diagnosis not present

## 2012-11-24 DIAGNOSIS — S5010XA Contusion of unspecified forearm, initial encounter: Secondary | ICD-10-CM | POA: Diagnosis not present

## 2012-11-24 DIAGNOSIS — S5000XA Contusion of unspecified elbow, initial encounter: Secondary | ICD-10-CM | POA: Diagnosis not present

## 2012-11-24 DIAGNOSIS — S4980XA Other specified injuries of shoulder and upper arm, unspecified arm, initial encounter: Secondary | ICD-10-CM | POA: Diagnosis not present

## 2012-11-24 DIAGNOSIS — S51809A Unspecified open wound of unspecified forearm, initial encounter: Secondary | ICD-10-CM | POA: Diagnosis not present

## 2012-11-24 DIAGNOSIS — S5012XA Contusion of left forearm, initial encounter: Secondary | ICD-10-CM

## 2012-11-24 HISTORY — DX: Cerebral infarction, unspecified: I63.9

## 2012-11-24 MED ORDER — ACETAMINOPHEN 650 MG PO TABS: 1.0000 | ORAL_TABLET | Freq: Three times a day (TID) | ORAL | Status: DC | PRN

## 2012-11-24 MED ORDER — SILVER SULFADIAZINE 1 % EX CREA: TOPICAL_CREAM | Freq: Every day | CUTANEOUS | Status: DC

## 2012-11-24 NOTE — ED Notes (Signed)
States she fell last PM, impacted left elbow area. Unsure of cause of fall. Denies other injury

## 2012-11-24 NOTE — ED Provider Notes (Signed)
History     CSN: [id number]  Arrival date & time 11/24/12  1002   First MD Initiated Contact with Patient 11/24/12 1023      Chief Complaint  Patient presents with  . Arm Injury    (Consider location/radiation/quality/duration/timing/severity/associated sxs/prior treatment) HPI Comments: 79 year old female with history of hypertension, mild dementia and embolic stroke in April 2012. Here with her husband complaining of left elbow and shoulder pain. Patient states that she fell to floor last night when she got up from bed and was going to the bathroom and landed on her left arm. She denies loss of consciousness or injury to the head. She denies dizziness, shortness of breath or palpitations before her fall. She called her husband when she fell and he helped her to get up, husband states patient was alert and denies any urinary incontinence, seizures or altered mentation right after the episode. Patient sustained an abrasion in her left forearm. Today her entire left arm is sore especially elbow area, reason she decided to come to have it checked. Denies arm or other extremities weakness. Patient denies headache, blurred vision or dizziness. No current gait or balance problems. No nausea or vomiting. Patient reports memory lapses which is not new for her. Denies chest pain or shortness of breath.   Past Medical History  Diagnosis Date  . Hypertension   . Stroke     History reviewed. No pertinent past surgical history.  History reviewed. No pertinent family history.  History  Substance Use Topics  . Smoking status: Not on file  . Smokeless tobacco: Not on file  . Alcohol Use:     OB History    Grav Para Term Preterm Abortions TAB SAB Ect Mult Living                  Review of Systems  Constitutional: Negative for fever, chills, diaphoresis and activity change.  HENT: Negative for neck pain.        Denies head trauma.  Eyes: Negative for visual disturbance.  Respiratory:  Negative for chest tightness and shortness of breath.   Cardiovascular: Negative for chest pain, palpitations and leg swelling.  Gastrointestinal: Negative for nausea, vomiting, abdominal pain and diarrhea.  Genitourinary: Negative for dysuria and frequency.  Musculoskeletal:       Left elbow and left forearm pain as per history of present illness.  Neurological: Negative for dizziness, tremors, seizures, syncope, speech difficulty, weakness, light-headedness and headaches.  All other systems reviewed and are negative.    Allergies  Review of patient's allergies indicates no known allergies.  Home Medications   Current Outpatient Rx  Name  Route  Sig  Dispense  Refill  . AMLODIPINE BESYLATE 5 MG PO TABS   Oral   Take 5 mg by mouth daily.         . ASPIRIN 162 MG PO TBEC   Oral   Take 162 mg by mouth daily.         Marland Kitchen MEMANTINE HCL 10 MG PO TABS   Oral   Take 10 mg by mouth 2 (two) times daily.         . TELMISARTAN 20 MG PO TABS   Oral   Take 20 mg by mouth 2 (two) times daily.         . ACETAMINOPHEN 650 MG PO TABS   Oral   Take 1 tablet (650 mg total) by mouth 3 (three) times daily as needed.   30 tablet  0   . SILVER SULFADIAZINE 1 % EX CREA   Topical   Apply topically daily.   50 g   0     BP 161/77  Pulse 95  Temp 97.8 F (36.6 C) (Oral)  Resp 20  SpO2 99%  Physical Exam  Nursing note and vitals reviewed. Constitutional: She is oriented to person, place, and time. She appears well-developed and well-nourished. No distress.  HENT:  Head: Normocephalic and atraumatic.  Eyes: Conjunctivae normal and EOM are normal. Pupils are equal, round, and reactive to light.  Neck: Neck supple.  Cardiovascular: Normal rate, regular rhythm, normal heart sounds and intact distal pulses.  Exam reveals no gallop and no friction rub.   No murmur heard. Pulmonary/Chest: Effort normal and breath sounds normal. No respiratory distress. She has no wheezes. She has  no rales. She exhibits no tenderness.  Musculoskeletal:       Left shoulder: Reported minimal discomfort with palpation of the anterior shoulder. Pain worse with arm elevation against resistance. Negative impingement maneuvers. Left elbow. Tenderness to palpation about 4 cm below olecranon. There is a superficial skin abrasion with mild bruising associated to the focal tender area. Patient able to flex and extend elbow with reported minimal discomfort. Left forearm. Focal tenderness over ulnar bone below the elbow associated with soft tissue swelling, bruising and a superficial abrasion. Intact superficial sensation. Left extremity strength appears symmetric compare with the right. Impress neurovascularly intact.  Neurological: She is alert and oriented to person, place, and time. She has normal strength and normal reflexes. She displays a negative Romberg sign. Coordination and gait normal. GCS eye subscore is 4. GCS verbal subscore is 5. GCS motor subscore is 6.       No face drop. No arm drop.  Visual fields normal by comparison.   Skin: She is not diaphoretic.    ED Course  Procedures (including critical care time)  Labs Reviewed - No data to display Dg Elbow Complete Left  11/24/2012  *RADIOLOGY REPORT*  Clinical Data: Fall.  Bruising along the elbow.  LEFT ELBOW - COMPLETE 3+ VIEW  Comparison: None.  Findings: Faint chondrocalcinosis noted.  No elbow effusion or fracture is observed.  There appears to be soft tissue swelling dorsally along the proximal forearm.  IMPRESSION:  1.  No fracture or elbow effusion identified. 2.  Dorsal soft tissue swelling along the proximal forearm. 3.  Chondrocalcinosis, query CPPD arthropathy.   Original Report Authenticated By: Gaylyn Rong, M.D.    Dg Forearm Left  11/24/2012  *RADIOLOGY REPORT*  Clinical Data: Post fall.  Open wound posterior forearm.  LEFT FOREARM - 2 VIEW  Comparison: None.  Findings: The mineralization and alignment are normal.   There is no evidence of acute fracture or dislocation.  There is mild soft tissue irregularity over the dorsal aspect of the proximal forearm. No underlying foreign body is identified.  Chondrocalcinosis is noted at the wrist.  IMPRESSION: No acute osseous findings or foreign body identified.   Original Report Authenticated By: Carey Bullocks, M.D.    Dg Shoulder Left  11/24/2012  *RADIOLOGY REPORT*  Clinical Data: Injury.  Pain  LEFT SHOULDER - 2+ VIEW  Comparison: None  Findings: Negative for fracture.  Normal alignment and no significant arthropathy.  IMPRESSION: Negative   Original Report Authenticated By: Janeece Riggers, M.D.      1. Contusion of forearm, left    EKG: Normal sinus rhythm. With a ventricular rate of 86 beats per minutes. No  arrhythmia no acute ischemic changes. Low-voltage similar to prior studies.   MDM  Impress accidental fall not related to cardiovascular events. Discussed safety issues like walking in the dark at night. (Husband will place nightlights and motion activated lighting. Prescribed Silvadene for superficial wound abrasion. Supportive care and red flags that should prompt her return to medical attention discussed with patient and her husband and provided in writing. Specifically patient and husband were instructed to call 911 or go to the emergency department if any new symptoms like (but not limited to) altered mentation, dizziness, headache, visual changes, speech, gait or balance problems, extremity weakness etc.       Sharin Grave, MD 11/26/12 1026

## 2012-12-27 DIAGNOSIS — I6350 Cerebral infarction due to unspecified occlusion or stenosis of unspecified cerebral artery: Secondary | ICD-10-CM | POA: Diagnosis not present

## 2013-01-13 DIAGNOSIS — L259 Unspecified contact dermatitis, unspecified cause: Secondary | ICD-10-CM | POA: Diagnosis not present

## 2013-01-19 DIAGNOSIS — S83289A Other tear of lateral meniscus, current injury, unspecified knee, initial encounter: Secondary | ICD-10-CM | POA: Diagnosis not present

## 2013-01-19 DIAGNOSIS — M1710 Unilateral primary osteoarthritis, unspecified knee: Secondary | ICD-10-CM | POA: Diagnosis not present

## 2013-02-08 DIAGNOSIS — L259 Unspecified contact dermatitis, unspecified cause: Secondary | ICD-10-CM | POA: Diagnosis not present

## 2013-04-01 DIAGNOSIS — L578 Other skin changes due to chronic exposure to nonionizing radiation: Secondary | ICD-10-CM | POA: Diagnosis not present

## 2013-04-01 DIAGNOSIS — L570 Actinic keratosis: Secondary | ICD-10-CM | POA: Diagnosis not present

## 2013-04-12 DIAGNOSIS — L089 Local infection of the skin and subcutaneous tissue, unspecified: Secondary | ICD-10-CM | POA: Diagnosis not present

## 2013-04-12 DIAGNOSIS — L259 Unspecified contact dermatitis, unspecified cause: Secondary | ICD-10-CM | POA: Diagnosis not present

## 2013-04-20 DIAGNOSIS — L259 Unspecified contact dermatitis, unspecified cause: Secondary | ICD-10-CM | POA: Diagnosis not present

## 2013-06-08 DIAGNOSIS — H2510 Age-related nuclear cataract, unspecified eye: Secondary | ICD-10-CM | POA: Diagnosis not present

## 2013-06-16 DIAGNOSIS — H2510 Age-related nuclear cataract, unspecified eye: Secondary | ICD-10-CM | POA: Diagnosis not present

## 2013-06-16 DIAGNOSIS — H2589 Other age-related cataract: Secondary | ICD-10-CM | POA: Diagnosis not present

## 2013-08-04 DIAGNOSIS — R82998 Other abnormal findings in urine: Secondary | ICD-10-CM | POA: Diagnosis not present

## 2013-08-04 DIAGNOSIS — M899 Disorder of bone, unspecified: Secondary | ICD-10-CM | POA: Diagnosis not present

## 2013-08-04 DIAGNOSIS — E785 Hyperlipidemia, unspecified: Secondary | ICD-10-CM | POA: Diagnosis not present

## 2013-08-04 DIAGNOSIS — I10 Essential (primary) hypertension: Secondary | ICD-10-CM | POA: Diagnosis not present

## 2013-08-11 DIAGNOSIS — I10 Essential (primary) hypertension: Secondary | ICD-10-CM | POA: Diagnosis not present

## 2013-08-11 DIAGNOSIS — E785 Hyperlipidemia, unspecified: Secondary | ICD-10-CM | POA: Diagnosis not present

## 2013-08-11 DIAGNOSIS — Z1331 Encounter for screening for depression: Secondary | ICD-10-CM | POA: Diagnosis not present

## 2013-08-11 DIAGNOSIS — I6350 Cerebral infarction due to unspecified occlusion or stenosis of unspecified cerebral artery: Secondary | ICD-10-CM | POA: Diagnosis not present

## 2013-08-11 DIAGNOSIS — Z23 Encounter for immunization: Secondary | ICD-10-CM | POA: Diagnosis not present

## 2013-08-11 DIAGNOSIS — Q211 Atrial septal defect: Secondary | ICD-10-CM | POA: Diagnosis not present

## 2013-08-11 DIAGNOSIS — R809 Proteinuria, unspecified: Secondary | ICD-10-CM | POA: Diagnosis not present

## 2013-08-11 DIAGNOSIS — Z79899 Other long term (current) drug therapy: Secondary | ICD-10-CM | POA: Diagnosis not present

## 2013-08-11 DIAGNOSIS — M5030 Other cervical disc degeneration, unspecified cervical region: Secondary | ICD-10-CM | POA: Diagnosis not present

## 2013-08-11 DIAGNOSIS — Z124 Encounter for screening for malignant neoplasm of cervix: Secondary | ICD-10-CM | POA: Diagnosis not present

## 2013-08-11 DIAGNOSIS — D649 Anemia, unspecified: Secondary | ICD-10-CM | POA: Diagnosis not present

## 2013-08-11 DIAGNOSIS — Z0000 Encounter for general adult medical examination without abnormal findings: Secondary | ICD-10-CM | POA: Diagnosis not present

## 2013-09-08 DIAGNOSIS — H2510 Age-related nuclear cataract, unspecified eye: Secondary | ICD-10-CM | POA: Diagnosis not present

## 2013-09-08 DIAGNOSIS — H2589 Other age-related cataract: Secondary | ICD-10-CM | POA: Diagnosis not present

## 2014-01-23 DIAGNOSIS — M1710 Unilateral primary osteoarthritis, unspecified knee: Secondary | ICD-10-CM | POA: Diagnosis not present

## 2014-02-15 DIAGNOSIS — Z23 Encounter for immunization: Secondary | ICD-10-CM | POA: Diagnosis not present

## 2014-02-15 DIAGNOSIS — Z6827 Body mass index (BMI) 27.0-27.9, adult: Secondary | ICD-10-CM | POA: Diagnosis not present

## 2014-02-15 DIAGNOSIS — I10 Essential (primary) hypertension: Secondary | ICD-10-CM | POA: Diagnosis not present

## 2014-02-15 DIAGNOSIS — E785 Hyperlipidemia, unspecified: Secondary | ICD-10-CM | POA: Diagnosis not present

## 2014-02-15 DIAGNOSIS — F068 Other specified mental disorders due to known physiological condition: Secondary | ICD-10-CM | POA: Diagnosis not present

## 2014-03-08 DIAGNOSIS — S83289A Other tear of lateral meniscus, current injury, unspecified knee, initial encounter: Secondary | ICD-10-CM | POA: Diagnosis not present

## 2014-03-28 DIAGNOSIS — G5600 Carpal tunnel syndrome, unspecified upper limb: Secondary | ICD-10-CM | POA: Diagnosis not present

## 2014-04-20 ENCOUNTER — Encounter (HOSPITAL_COMMUNITY): Payer: Self-pay

## 2014-04-25 ENCOUNTER — Ambulatory Visit (HOSPITAL_COMMUNITY)
Admission: RE | Admit: 2014-04-25 | Discharge: 2014-04-25 | Disposition: A | Discharge status: Home / Self Care | Payer: Medicare Other | Source: Ambulatory Visit | Attending: Orthopedic Surgery | Admitting: Orthopedic Surgery

## 2014-04-25 ENCOUNTER — Encounter (HOSPITAL_COMMUNITY): Payer: Self-pay

## 2014-04-25 ENCOUNTER — Encounter (HOSPITAL_COMMUNITY)
Admission: RE | Admit: 2014-04-25 | Discharge: 2014-04-25 | Disposition: A | Discharge status: Home / Self Care | Payer: Medicare Other | Source: Ambulatory Visit | Attending: Orthopedic Surgery | Admitting: Orthopedic Surgery

## 2014-04-25 DIAGNOSIS — I10 Essential (primary) hypertension: Secondary | ICD-10-CM | POA: Insufficient documentation

## 2014-04-25 DIAGNOSIS — Z01818 Encounter for other preprocedural examination: Secondary | ICD-10-CM | POA: Diagnosis not present

## 2014-04-25 HISTORY — DX: Patent foramen ovale: Q21.12

## 2014-04-25 HISTORY — DX: Atrial septal defect: Q21.1

## 2014-04-25 HISTORY — DX: Unspecified dementia, unspecified severity, without behavioral disturbance, psychotic disturbance, mood disturbance, and anxiety: F03.90

## 2014-04-25 HISTORY — DX: Unspecified osteoarthritis, unspecified site: M19.90

## 2014-04-25 LAB — CBC
HEMATOCRIT: 34.8 % — AB (ref 36.0–46.0)
HEMOGLOBIN: 11.4 g/dL — AB (ref 12.0–15.0)
MCH: 33.6 pg (ref 26.0–34.0)
MCHC: 32.8 g/dL (ref 30.0–36.0)
MCV: 102.7 fL — AB (ref 78.0–100.0)
Platelets: 234 10*3/uL (ref 150–400)
RBC: 3.39 MIL/uL — AB (ref 3.87–5.11)
RDW: 16 % — ABNORMAL HIGH (ref 11.5–15.5)
WBC: 6.1 10*3/uL (ref 4.0–10.5)

## 2014-04-25 LAB — PROTIME-INR
INR: 1.03 (ref 0.00–1.49)
PROTHROMBIN TIME: 13.5 s (ref 11.6–15.2)

## 2014-04-25 LAB — BASIC METABOLIC PANEL
Anion gap: 13 (ref 5–15)
BUN: 14 mg/dL (ref 6–23)
CALCIUM: 9.4 mg/dL (ref 8.4–10.5)
CO2: 25 meq/L (ref 19–32)
Chloride: 103 mEq/L (ref 96–112)
Creatinine, Ser: 0.76 mg/dL (ref 0.50–1.10)
GFR calc Af Amer: 87 mL/min — ABNORMAL LOW (ref >90)
GFR calc non Af Amer: 75 mL/min — ABNORMAL LOW (ref >90)
GLUCOSE: 98 mg/dL (ref 70–99)
Potassium: 4.2 mEq/L (ref 3.7–5.3)
Sodium: 141 mEq/L (ref 137–147)

## 2014-04-25 LAB — ABO/RH: ABO/RH(D): O NEG

## 2014-04-25 LAB — TYPE AND SCREEN
ABO/RH(D): O NEG
Antibody Screen: NEGATIVE

## 2014-04-25 LAB — APTT: aPTT: 31 seconds (ref 24–37)

## 2014-04-25 LAB — SURGICAL PCR SCREEN
MRSA, PCR: NEGATIVE
STAPHYLOCOCCUS AUREUS: NEGATIVE

## 2014-04-25 NOTE — Pre-Procedure Instructions (Signed)
Cathy Johnson  04/25/2014   Your procedure is scheduled on:  Tuesday  05/02/14  Report to [facility] Admitting at 850 AM.  Call this number if you have problems the morning of surgery: [phone]   Remember:   Do not eat food or drink liquids after midnight.   Take these medicines the morning of surgery with A SIP OF WATER:  TYLENOL IF NEEDED, AMLODIPINE(NORVASC), NAMENDA (STOP ASPIRIN)   Do not wear jewelry, make-up or nail polish.  Do not wear lotions, powders, or perfumes. You may wear deodorant.  Do not shave 48 hours prior to surgery. Men may shave face and neck.  Do not bring valuables to the hospital.  [facility] is not responsible                  for any belongings or valuables.               Contacts, dentures or bridgework may not be worn into surgery.  Leave suitcase in the car. After surgery it may be brought to your room.  For patients admitted to the hospital, discharge time is determined by your                treatment team.               Patients discharged the day of surgery will not be allowed to drive  home.  Name and phone number of your driver:  Special Instructions: SEE PREPARING FOR SURGERY   Please read over the following fact sheets that you were given: Pain Booklet, Coughing and Deep Breathing, Blood Transfusion Information, Total Joint Packet, MRSA Information and Surgical Site Infection Prevention

## 2014-04-25 NOTE — Progress Notes (Signed)
Left message on Sherri's  voicemail regarding needing orders for PAT.

## 2014-04-26 ENCOUNTER — Encounter (HOSPITAL_COMMUNITY): Payer: Self-pay

## 2014-04-26 NOTE — Progress Notes (Signed)
Anesthesia Chart Review:  Patient is a 80 year old female scheduled for right unicompartmental knee on 05/02/14 by Dr. Mardelle Matte. Orders were pending at the time of her PAT visit.  History includes non-smoker, HTN, left CVA '12 s/p TPA, dementia, arthritis, left breast mass excision. She had normal coronaries and normal LVF by a cardiac cath on 01/11/03 (Dr. Peter Martinique). PCP is listed as Dr. Crist Infante.  EKG on 04/25/14 showed: SR, low voltage QRS, cannot rule out septal infarct (age undetermined), non-specific ST abnormality. She has numerous prior EKGs in Epic and/or Muse.  She has intermittently shown findings of septal/anterior infarct on prior EKGs.  Overall, I think she has had similar EKG findings on prior tracings dating back to 01/11/03, although non-specific ST abnormality appears new since then..     TEE on 02/05/11 showed: Normal LV cavity size.  Mild concentric LVH. Normal LV systolic function, EF 35-46%, normal wall motion with no regional wall motion abnormalities. Trivial MR. LA is mildly dilated. Mild PR. Trivial TR. PFO was present with positive bubble study.  Preoperative CXR and labs noted.    I reviewed history and previous echo findings (PFO) with anesthesiologist Dr. Tobias Alexander.  Strict precautions can be taken with IVF/lines due to history of PFO, but otherwise it is anticipated that she can proceed as planned.  Further evaluation by her assigned anesthesiologist on the day of surgery.  George Hugh [facility] Stay [facility]/Anesthesiology Phone [phone] 04/26/2014 3:18 PM

## 2014-04-29 ENCOUNTER — Other Ambulatory Visit: Payer: Self-pay | Admitting: Orthopedic Surgery

## 2014-05-01 MED ORDER — CEFAZOLIN SODIUM-DEXTROSE 2-3 GM-% IV SOLR
2.0000 g | INTRAVENOUS | Status: AC
  Administered 2014-05-02: 2 g via INTRAVENOUS
  Filled 2014-05-01: qty 50

## 2014-05-01 NOTE — Progress Notes (Signed)
Spoke with pts care giver and informed him that surgery time had changed and pt needs to arrive at 0745 with all other instructions remaining the same.  States understanding.

## 2014-05-02 ENCOUNTER — Inpatient Hospital Stay (HOSPITAL_COMMUNITY)
Admission: RE | Admit: 2014-05-02 | Discharge: 2014-05-04 | DRG: 470 | Disposition: A | Discharge status: Home Health | Payer: Medicare Other | Source: Ambulatory Visit | Attending: Orthopedic Surgery | Admitting: Orthopedic Surgery

## 2014-05-02 ENCOUNTER — Inpatient Hospital Stay (HOSPITAL_COMMUNITY): Payer: Medicare Other | Admitting: Anesthesiology

## 2014-05-02 ENCOUNTER — Inpatient Hospital Stay (HOSPITAL_COMMUNITY): Payer: Medicare Other

## 2014-05-02 ENCOUNTER — Encounter (HOSPITAL_COMMUNITY): Payer: Medicare Other | Admitting: Vascular Surgery

## 2014-05-02 ENCOUNTER — Encounter (HOSPITAL_COMMUNITY)
Admission: RE | Disposition: A | Discharge status: Home Health | Payer: Self-pay | Source: Ambulatory Visit | Attending: Orthopedic Surgery

## 2014-05-02 ENCOUNTER — Encounter (HOSPITAL_COMMUNITY): Payer: Self-pay | Admitting: *Deleted

## 2014-05-02 DIAGNOSIS — Z7901 Long term (current) use of anticoagulants: Secondary | ICD-10-CM

## 2014-05-02 DIAGNOSIS — Z96659 Presence of unspecified artificial knee joint: Secondary | ICD-10-CM | POA: Diagnosis not present

## 2014-05-02 DIAGNOSIS — M179 Osteoarthritis of knee, unspecified: Secondary | ICD-10-CM | POA: Diagnosis present

## 2014-05-02 DIAGNOSIS — I10 Essential (primary) hypertension: Secondary | ICD-10-CM | POA: Diagnosis not present

## 2014-05-02 DIAGNOSIS — Q2111 Secundum atrial septal defect: Secondary | ICD-10-CM

## 2014-05-02 DIAGNOSIS — Z8673 Personal history of transient ischemic attack (TIA), and cerebral infarction without residual deficits: Secondary | ICD-10-CM | POA: Diagnosis not present

## 2014-05-02 DIAGNOSIS — Q211 Atrial septal defect: Secondary | ICD-10-CM | POA: Diagnosis not present

## 2014-05-02 DIAGNOSIS — G8918 Other acute postprocedural pain: Secondary | ICD-10-CM | POA: Diagnosis not present

## 2014-05-02 DIAGNOSIS — M1710 Unilateral primary osteoarthritis, unspecified knee: Principal | ICD-10-CM | POA: Diagnosis present

## 2014-05-02 DIAGNOSIS — Z79899 Other long term (current) drug therapy: Secondary | ICD-10-CM

## 2014-05-02 DIAGNOSIS — M259 Joint disorder, unspecified: Secondary | ICD-10-CM | POA: Diagnosis not present

## 2014-05-02 DIAGNOSIS — M1711 Unilateral primary osteoarthritis, right knee: Secondary | ICD-10-CM | POA: Diagnosis present

## 2014-05-02 HISTORY — DX: Unilateral primary osteoarthritis, right knee: M17.11

## 2014-05-02 HISTORY — PX: PARTIAL KNEE ARTHROPLASTY: SHX2174

## 2014-05-02 SURGERY — ARTHROPLASTY, KNEE, UNICOMPARTMENTAL
Anesthesia: Regional | Laterality: Right

## 2014-05-02 MED ORDER — HYDROMORPHONE HCL PF 1 MG/ML IJ SOLN
0.2500 mg | INTRAMUSCULAR | Status: DC | PRN
  Administered 2014-05-02: 0.25 mg via INTRAVENOUS
  Administered 2014-05-02: 0.5 mg via INTRAVENOUS
  Administered 2014-05-02: 0.25 mg via INTRAVENOUS

## 2014-05-02 MED ORDER — MENTHOL 3 MG MT LOZG: 1.0000 | LOZENGE | OROMUCOSAL | Status: DC | PRN

## 2014-05-02 MED ORDER — ARTIFICIAL TEARS OP OINT
TOPICAL_OINTMENT | OPHTHALMIC | Status: DC | PRN
  Administered 2014-05-02: 1 via OPHTHALMIC

## 2014-05-02 MED ORDER — DEXAMETHASONE SODIUM PHOSPHATE 10 MG/ML IJ SOLN
10.0000 mg | Freq: Three times a day (TID) | INTRAMUSCULAR | Status: AC
  Administered 2014-05-03: 10 mg via INTRAVENOUS
  Filled 2014-05-02 (×3): qty 1

## 2014-05-02 MED ORDER — BISACODYL 10 MG RE SUPP: 10.0000 mg | Freq: Every day | RECTAL | Status: DC | PRN

## 2014-05-02 MED ORDER — KETOROLAC TROMETHAMINE 15 MG/ML IJ SOLN
7.5000 mg | Freq: Four times a day (QID) | INTRAMUSCULAR | Status: AC
  Administered 2014-05-02 – 2014-05-03 (×3): 7.5 mg via INTRAVENOUS

## 2014-05-02 MED ORDER — OXYCODONE HCL 5 MG/5ML PO SOLN: 5.0000 mg | Freq: Once | ORAL | Status: DC | PRN

## 2014-05-02 MED ORDER — LIDOCAINE HCL (CARDIAC) 20 MG/ML IV SOLN
INTRAVENOUS | Status: DC | PRN
  Administered 2014-05-02: 50 mg via INTRAVENOUS
  Administered 2014-05-02: 100 mg via INTRAVENOUS

## 2014-05-02 MED ORDER — DOCUSATE SODIUM 100 MG PO CAPS
100.0000 mg | ORAL_CAPSULE | Freq: Two times a day (BID) | ORAL | Status: DC
  Administered 2014-05-02 – 2014-05-04 (×4): 100 mg via ORAL
  Filled 2014-05-02 (×4): qty 1

## 2014-05-02 MED ORDER — MEMANTINE HCL 10 MG PO TABS
10.0000 mg | ORAL_TABLET | Freq: Two times a day (BID) | ORAL | Status: DC
  Administered 2014-05-02 – 2014-05-04 (×5): 10 mg via ORAL
  Filled 2014-05-02 (×6): qty 1

## 2014-05-02 MED ORDER — HYDROMORPHONE HCL PF 1 MG/ML IJ SOLN
INTRAMUSCULAR | Status: AC
  Filled 2014-05-02: qty 1

## 2014-05-02 MED ORDER — SODIUM CHLORIDE 0.9 % IR SOLN
Status: DC | PRN
  Administered 2014-05-02: 3000 mL

## 2014-05-02 MED ORDER — MAGNESIUM CITRATE PO SOLN: 1.0000 | Freq: Once | ORAL | Status: AC | PRN

## 2014-05-02 MED ORDER — METOCLOPRAMIDE HCL 10 MG PO TABS: 5.0000 mg | ORAL_TABLET | Freq: Three times a day (TID) | ORAL | Status: DC | PRN

## 2014-05-02 MED ORDER — ACETAMINOPHEN 650 MG RE SUPP: 650.0000 mg | Freq: Four times a day (QID) | RECTAL | Status: DC | PRN

## 2014-05-02 MED ORDER — METOCLOPRAMIDE HCL 5 MG/ML IJ SOLN: 5.0000 mg | Freq: Three times a day (TID) | INTRAMUSCULAR | Status: DC | PRN

## 2014-05-02 MED ORDER — ARTIFICIAL TEARS OP OINT
TOPICAL_OINTMENT | OPHTHALMIC | Status: AC
  Filled 2014-05-02: qty 3.5

## 2014-05-02 MED ORDER — PROPOFOL 10 MG/ML IV BOLUS
INTRAVENOUS | Status: DC | PRN
  Administered 2014-05-02: 20 mg via INTRAVENOUS
  Administered 2014-05-02: 150 mg via INTRAVENOUS
  Administered 2014-05-02: 30 mg via INTRAVENOUS

## 2014-05-02 MED ORDER — ONDANSETRON HCL 4 MG/2ML IJ SOLN
INTRAMUSCULAR | Status: AC
  Filled 2014-05-02: qty 2

## 2014-05-02 MED ORDER — POTASSIUM CHLORIDE IN NACL 20-0.45 MEQ/L-% IV SOLN
INTRAVENOUS | Status: DC
  Administered 2014-05-02: 16:00:00 via INTRAVENOUS
  Filled 2014-05-02 (×5): qty 1000

## 2014-05-02 MED ORDER — HYDROCODONE-ACETAMINOPHEN 10-325 MG PO TABS: 1.0000 | ORAL_TABLET | Freq: Four times a day (QID) | ORAL | Status: DC | PRN

## 2014-05-02 MED ORDER — ONDANSETRON HCL 4 MG/2ML IJ SOLN: 4.0000 mg | Freq: Once | INTRAMUSCULAR | Status: DC | PRN

## 2014-05-02 MED ORDER — FENTANYL CITRATE 0.05 MG/ML IJ SOLN
INTRAMUSCULAR | Status: DC | PRN
  Administered 2014-05-02 (×2): 50 ug via INTRAVENOUS

## 2014-05-02 MED ORDER — ALUM & MAG HYDROXIDE-SIMETH 200-200-20 MG/5ML PO SUSP: 30.0000 mL | ORAL | Status: DC | PRN

## 2014-05-02 MED ORDER — ONDANSETRON HCL 4 MG/2ML IJ SOLN
INTRAMUSCULAR | Status: DC | PRN
  Administered 2014-05-02: 4 mg via INTRAVENOUS

## 2014-05-02 MED ORDER — MORPHINE SULFATE 2 MG/ML IJ SOLN: 2.0000 mg | INTRAMUSCULAR | Status: DC | PRN

## 2014-05-02 MED ORDER — RIVAROXABAN 10 MG PO TABS
10.0000 mg | ORAL_TABLET | Freq: Every day | ORAL | Status: DC
  Administered 2014-05-03 – 2014-05-04 (×2): 10 mg via ORAL
  Filled 2014-05-02 (×3): qty 1

## 2014-05-02 MED ORDER — POLYETHYLENE GLYCOL 3350 17 G PO PACK: 17.0000 g | PACK | Freq: Every day | ORAL | Status: DC | PRN

## 2014-05-02 MED ORDER — ACETAMINOPHEN 500 MG PO TABS
1000.0000 mg | ORAL_TABLET | Freq: Four times a day (QID) | ORAL | Status: AC
  Administered 2014-05-02 – 2014-05-03 (×2): 1000 mg via ORAL
  Filled 2014-05-02 (×3): qty 2

## 2014-05-02 MED ORDER — PHENOL 1.4 % MT LIQD: 1.0000 | OROMUCOSAL | Status: DC | PRN

## 2014-05-02 MED ORDER — ONDANSETRON HCL 4 MG PO TABS: 4.0000 mg | ORAL_TABLET | Freq: Four times a day (QID) | ORAL | Status: DC | PRN

## 2014-05-02 MED ORDER — BUPIVACAINE-EPINEPHRINE (PF) 0.5% -1:200000 IJ SOLN
INTRAMUSCULAR | Status: DC | PRN
  Administered 2014-05-02: 30 mL via PERINEURAL

## 2014-05-02 MED ORDER — IRBESARTAN 150 MG PO TABS
150.0000 mg | ORAL_TABLET | Freq: Every day | ORAL | Status: DC
  Administered 2014-05-02 – 2014-05-04 (×3): 150 mg via ORAL
  Filled 2014-05-02 (×3): qty 1

## 2014-05-02 MED ORDER — ACETAMINOPHEN 325 MG PO TABS: 650.0000 mg | ORAL_TABLET | Freq: Four times a day (QID) | ORAL | Status: DC | PRN

## 2014-05-02 MED ORDER — SENNA-DOCUSATE SODIUM 8.6-50 MG PO TABS: 2.0000 | ORAL_TABLET | Freq: Every day | ORAL | Status: DC

## 2014-05-02 MED ORDER — CEFAZOLIN SODIUM-DEXTROSE 2-3 GM-% IV SOLR
2.0000 g | Freq: Four times a day (QID) | INTRAVENOUS | Status: AC
  Administered 2014-05-02 (×2): 2 g via INTRAVENOUS
  Filled 2014-05-02 (×3): qty 50

## 2014-05-02 MED ORDER — DEXAMETHASONE 6 MG PO TABS
10.0000 mg | ORAL_TABLET | Freq: Three times a day (TID) | ORAL | Status: AC
  Administered 2014-05-02 (×2): 10 mg via ORAL
  Filled 2014-05-02 (×3): qty 1

## 2014-05-02 MED ORDER — METHOCARBAMOL 500 MG PO TABS
500.0000 mg | ORAL_TABLET | Freq: Four times a day (QID) | ORAL | Status: DC | PRN
  Administered 2014-05-03: 500 mg via ORAL
  Filled 2014-05-02: qty 1

## 2014-05-02 MED ORDER — AMLODIPINE BESYLATE 5 MG PO TABS
5.0000 mg | ORAL_TABLET | Freq: Every day | ORAL | Status: DC
  Administered 2014-05-02 – 2014-05-04 (×3): 5 mg via ORAL
  Filled 2014-05-02 (×3): qty 1

## 2014-05-02 MED ORDER — FENTANYL CITRATE 0.05 MG/ML IJ SOLN
INTRAMUSCULAR | Status: AC
  Administered 2014-05-02: 50 ug
  Filled 2014-05-02: qty 2

## 2014-05-02 MED ORDER — MIDAZOLAM HCL 2 MG/2ML IJ SOLN
INTRAMUSCULAR | Status: AC
  Filled 2014-05-02: qty 2

## 2014-05-02 MED ORDER — DIPHENHYDRAMINE HCL 12.5 MG/5ML PO ELIX
12.5000 mg | ORAL_SOLUTION | ORAL | Status: DC | PRN
Start: 2014-05-02 — End: 2014-05-04
  Administered 2014-05-03: 25 mg via ORAL
  Filled 2014-05-02: qty 10

## 2014-05-02 MED ORDER — RIVAROXABAN 10 MG PO TABS: 10.0000 mg | ORAL_TABLET | Freq: Every day | ORAL | Status: DC

## 2014-05-02 MED ORDER — RIVASTIGMINE 4.6 MG/24HR TD PT24
4.6000 mg | MEDICATED_PATCH | Freq: Every day | TRANSDERMAL | Status: DC
  Administered 2014-05-03 – 2014-05-04 (×2): 4.6 mg via TRANSDERMAL
  Filled 2014-05-02 (×4): qty 1

## 2014-05-02 MED ORDER — SENNA 8.6 MG PO TABS
1.0000 | ORAL_TABLET | Freq: Two times a day (BID) | ORAL | Status: DC
  Administered 2014-05-02 – 2014-05-04 (×4): 8.6 mg via ORAL
  Filled 2014-05-02 (×6): qty 1

## 2014-05-02 MED ORDER — HYDROCODONE-ACETAMINOPHEN 5-325 MG PO TABS
1.0000 | ORAL_TABLET | ORAL | Status: DC | PRN
  Administered 2014-05-03 – 2014-05-04 (×3): 2 via ORAL
  Filled 2014-05-02 (×3): qty 2

## 2014-05-02 MED ORDER — OXYCODONE HCL 5 MG PO TABS: 5.0000 mg | ORAL_TABLET | Freq: Once | ORAL | Status: DC | PRN

## 2014-05-02 MED ORDER — BACLOFEN 10 MG PO TABS: 10.0000 mg | ORAL_TABLET | Freq: Three times a day (TID) | ORAL | Status: DC

## 2014-05-02 MED ORDER — LACTATED RINGERS IV SOLN
INTRAVENOUS | Status: DC | PRN
  Administered 2014-05-02 (×2): via INTRAVENOUS

## 2014-05-02 MED ORDER — DEXTROSE 5 % IV SOLN: 500.0000 mg | Freq: Four times a day (QID) | INTRAVENOUS | Status: DC | PRN

## 2014-05-02 MED ORDER — CELECOXIB 200 MG PO CAPS
200.0000 mg | ORAL_CAPSULE | Freq: Two times a day (BID) | ORAL | Status: DC
  Administered 2014-05-02 – 2014-05-04 (×5): 200 mg via ORAL
  Filled 2014-05-02 (×6): qty 1

## 2014-05-02 MED ORDER — ATORVASTATIN CALCIUM 40 MG PO TABS
40.0000 mg | ORAL_TABLET | Freq: Every day | ORAL | Status: DC
  Administered 2014-05-02 – 2014-05-04 (×3): 40 mg via ORAL
  Filled 2014-05-02 (×3): qty 1

## 2014-05-02 MED ORDER — ONDANSETRON HCL 4 MG/2ML IJ SOLN
4.0000 mg | Freq: Four times a day (QID) | INTRAMUSCULAR | Status: DC | PRN
  Administered 2014-05-02 – 2014-05-03 (×2): 4 mg via INTRAVENOUS
  Filled 2014-05-02 (×2): qty 2

## 2014-05-02 MED ORDER — FENTANYL CITRATE 0.05 MG/ML IJ SOLN
INTRAMUSCULAR | Status: AC
  Filled 2014-05-02: qty 5

## 2014-05-02 MED ORDER — PHENYLEPHRINE HCL 10 MG/ML IJ SOLN
INTRAMUSCULAR | Status: DC | PRN
  Administered 2014-05-02: 80 ug via INTRAVENOUS
  Administered 2014-05-02: 40 ug via INTRAVENOUS
  Administered 2014-05-02 (×2): 80 ug via INTRAVENOUS

## 2014-05-02 MED ORDER — LIDOCAINE HCL (CARDIAC) 20 MG/ML IV SOLN
INTRAVENOUS | Status: AC
  Filled 2014-05-02: qty 5

## 2014-05-02 MED ORDER — MEPERIDINE HCL 25 MG/ML IJ SOLN: 6.2500 mg | INTRAMUSCULAR | Status: DC | PRN

## 2014-05-02 MED ORDER — PROPOFOL 10 MG/ML IV BOLUS
INTRAVENOUS | Status: AC
  Filled 2014-05-02: qty 20

## 2014-05-02 SURGICAL SUPPLY — 65 items
APL SKNCLS STERI-STRIP NONHPOA (GAUZE/BANDAGES/DRESSINGS) ×1
BANDAGE ELASTIC 6 VELCRO ST LF (GAUZE/BANDAGES/DRESSINGS) ×3 IMPLANT
BANDAGE ESMARK 6X9 LF (GAUZE/BANDAGES/DRESSINGS) ×1 IMPLANT
BENZOIN TINCTURE PRP APPL 2/3 (GAUZE/BANDAGES/DRESSINGS) ×3 IMPLANT
BNDG CMPR 9X6 STRL LF SNTH (GAUZE/BANDAGES/DRESSINGS) ×1
BNDG ESMARK 6X9 LF (GAUZE/BANDAGES/DRESSINGS) ×3
BOWL SMART MIX CTS (DISPOSABLE) ×3 IMPLANT
CAPT KNEE OXFORD ×2 IMPLANT
CEMENT HV SMART SET (Cement) ×3 IMPLANT
CLOSURE STERI-STRIP 1/2X4 (GAUZE/BANDAGES/DRESSINGS) ×1
CLSR STERI-STRIP ANTIMIC 1/2X4 (GAUZE/BANDAGES/DRESSINGS) ×2 IMPLANT
COVER SURGICAL LIGHT HANDLE (MISCELLANEOUS) ×3 IMPLANT
CUFF TOURNIQUET SINGLE 34IN LL (TOURNIQUET CUFF) ×3 IMPLANT
DRAPE EXTREMITY T 121X128X90 (DRAPE) ×3 IMPLANT
DRAPE ORTHO SPLIT 77X108 STRL (DRAPES)
DRAPE PROXIMA HALF (DRAPES) IMPLANT
DRAPE SURG ORHT 6 SPLT 77X108 (DRAPES) IMPLANT
DRAPE U-SHAPE 47X51 STRL (DRAPES) ×3 IMPLANT
DURAPREP 26ML APPLICATOR (WOUND CARE) ×3 IMPLANT
ELECT CAUTERY BLADE 6.4 (BLADE) ×3 IMPLANT
ELECT REM PT RETURN 9FT ADLT (ELECTROSURGICAL) ×3
ELECTRODE REM PT RTRN 9FT ADLT (ELECTROSURGICAL) ×1 IMPLANT
GLOVE BIO SURGEON STRL SZ7 (GLOVE) ×6 IMPLANT
GLOVE BIOGEL PI IND STRL 7.5 (GLOVE) ×1 IMPLANT
GLOVE BIOGEL PI INDICATOR 7.5 (GLOVE) ×6
GLOVE BIOGEL PI ORTHO PRO SZ8 (GLOVE) ×4
GLOVE ORTHO TXT STRL SZ7.5 (GLOVE) ×3 IMPLANT
GLOVE PI ORTHO PRO STRL SZ8 (GLOVE) ×2 IMPLANT
GLOVE SURG ORTHO 8.0 STRL STRW (GLOVE) ×6 IMPLANT
GOWN STRL REUS W/ TWL LRG LVL3 (GOWN DISPOSABLE) ×1 IMPLANT
GOWN STRL REUS W/ TWL XL LVL3 (GOWN DISPOSABLE) ×1 IMPLANT
GOWN STRL REUS W/TWL 2XL LVL3 (GOWN DISPOSABLE) ×3 IMPLANT
GOWN STRL REUS W/TWL LRG LVL3 (GOWN DISPOSABLE) ×3
GOWN STRL REUS W/TWL XL LVL3 (GOWN DISPOSABLE) ×3
HANDPIECE INTERPULSE COAX TIP (DISPOSABLE) ×3
HOOD PEEL AWAY FACE SHEILD DIS (HOOD) ×8 IMPLANT
IMMOBILIZER KNEE 22 UNIV (SOFTGOODS) ×3 IMPLANT
KIT BASIN OR (CUSTOM PROCEDURE TRAY) ×3 IMPLANT
KIT ROOM TURNOVER OR (KITS) ×3 IMPLANT
MANIFOLD NEPTUNE II (INSTRUMENTS) ×3 IMPLANT
NS IRRIG 1000ML POUR BTL (IV SOLUTION) ×3 IMPLANT
PACK TOTAL JOINT (CUSTOM PROCEDURE TRAY) ×3 IMPLANT
PAD ABD 8X10 STRL (GAUZE/BANDAGES/DRESSINGS) ×3 IMPLANT
PAD ARMBOARD 7.5X6 YLW CONV (MISCELLANEOUS) ×4 IMPLANT
PAD CAST 4YDX4 CTTN HI CHSV (CAST SUPPLIES) ×1 IMPLANT
PADDING CAST ABS 4INX4YD NS (CAST SUPPLIES) ×2
PADDING CAST ABS 6INX4YD NS (CAST SUPPLIES) ×2
PADDING CAST ABS COTTON 4X4 ST (CAST SUPPLIES) IMPLANT
PADDING CAST ABS COTTON 6X4 NS (CAST SUPPLIES) IMPLANT
PADDING CAST COTTON 4X4 STRL (CAST SUPPLIES) ×3
PADDING CAST COTTON 6X4 STRL (CAST SUPPLIES) ×3 IMPLANT
SAWBLADE OXFORD PARTIAL (BLADE) ×2 IMPLANT
SET HNDPC FAN SPRY TIP SCT (DISPOSABLE) ×1 IMPLANT
SPONGE GAUZE 4X4 12PLY (GAUZE/BANDAGES/DRESSINGS) ×3 IMPLANT
SUCTION FRAZIER TIP 10 FR DISP (SUCTIONS) ×3 IMPLANT
SUT MNCRL AB 4-0 PS2 18 (SUTURE) IMPLANT
SUT VIC AB 0 CT1 27 (SUTURE) ×3
SUT VIC AB 0 CT1 27XBRD ANBCTR (SUTURE) ×1 IMPLANT
SUT VIC AB 1 CT1 27 (SUTURE) ×3
SUT VIC AB 1 CT1 27XBRD ANBCTR (SUTURE) ×1 IMPLANT
SUT VIC AB 3-0 SH 8-18 (SUTURE) ×3 IMPLANT
SYR CONTROL 10ML LL (SYRINGE) IMPLANT
TOWEL OR 17X24 6PK STRL BLUE (TOWEL DISPOSABLE) ×3 IMPLANT
TOWEL OR 17X26 10 PK STRL BLUE (TOWEL DISPOSABLE) ×3 IMPLANT
WATER STERILE IRR 1000ML POUR (IV SOLUTION) ×3 IMPLANT

## 2014-05-02 NOTE — Op Note (Signed)
05/02/2014  11:59 AM  PATIENT:  Cathy Johnson    PRE-OPERATIVE DIAGNOSIS:  djd right knee  POST-OPERATIVE DIAGNOSIS:  Same  PROCEDURE:  UNICOMPARTMENTAL KNEE  SURGEON:  Johnny Bridge, MD  FIRST ASSISTANT:Daniel Percell Miller, MD, present and scrubbed throughout the case, critical for completion in a timely fashion, and for retraction, instrumentation, and closure.  Second assistant: Lemar Lofty, PA-C Student  ANESTHESIA:   General  PREOPERATIVE INDICATIONS:  Cathy Johnson is a  80 y.o. female with a diagnosis of djd right knee who failed conservative measures and elected for surgical management.    The risks benefits and alternatives were discussed with the patient preoperatively including but not limited to the risks of infection, bleeding, nerve injury, cardiopulmonary complications, blood clots, the need for revision surgery, among others, and the patient was willing to proceed.  OPERATIVE IMPLANTS: Biomet Oxford mobile bearing medial compartment arthroplasty femur size small, tibia size AA, bearing size 4.  OPERATIVE FINDINGS: Endstage grade 4 medial compartment osteoarthritis. No significant changes in the lateral or patellofemoral joint.  The ACL was intact.  OPERATIVE PROCEDURE: The patient was brought to the operating room placed in supine position. General anesthesia was administered. IV antibiotics were given. The lower extremity was placed in the legholder and prepped and draped in usual sterile fashion.  Time out was performed.  The leg was elevated and exsanguinated and the tourniquet was inflated. Anteromedial incision was performed, and I took care to preserve the MCL. Parapatellar incision was carried out, and the osteophytes were excised, along with the medial meniscus and a small portion of the fat pad.  The extra medullary tibial cutting jig was applied, using the spoon and the 16mm G-Clamp and the 2 mm shim, and I took care to protect the anterior cruciate  ligament insertion and the tibial spine. The medial collateral ligament was also protected, and I resected my proximal tibia, matching the anatomic slope.   The proximal tibial bony cut was removed in one piece, and I turned my attention to the femur.  The intramedullary femoral rod was placed using the drill, and then using the appropriate reference, I assembled the femoral jig, setting my posterior cutting block. I resected my posterior femur, used the 0 spigot for the anterior femur, and then measured my gap.   I then used the appropriate mill to match the extension gap to the flexion gap. The second milling was at a 3.  The gaps were then measured again with the appropriate feeler gauges. Once I had balanced flexion and extension gaps, I then completed the preparation of the femur.  I milled off the anterior aspect of the distal femur to prevent impingement. I also exposed the tibia, and selected the above-named component, and then used the cutting jig to prepare the keel slot on the tibia. I also used the awl to curette out the bone to complete the preparation of the keel. The back wall was intact.  I then placed trial components, and it was found to have excellent motion, and appropriate balance.  I then cemented the components into place, cementing the tibia first, removing all excess cement, and then cementing the femur.  All loose cement was removed.  The real polyethylene insert was applied manually, and the knee was taken through functional range of motion, and found to have excellent stability and restoration of joint motion, with excellent balance.  The wounds were irrigated copiously, and the parapatellar tissue closed with Vicryl, followed by Vicryl for  the subcutaneous tissue, with routine closure with Steri-Strips and sterile gauze.  The tourniquet was released, and the patient was awakened and extubated and returned to PACU in stable and satisfactory condition. There were no  complications.

## 2014-05-02 NOTE — Care Management Note (Signed)
CARE MANAGEMENT NOTE 05/02/2014  Patient:  Cathy Johnson, Cathy Johnson   Account Number:  [id number]  Date Initiated:  05/02/2014  Documentation initiated by:  Ricki Miller  Subjective/Objective Assessment:   80 yr old female s/p right unicompartmental knee     Action/Plan:   PT/OT eval  patient preoperatively setup with [facility] health, no changes.   Anticipated DC Date:  05/03/2014   Anticipated DC Plan:  [place]  CM consult      [facility] Choice  HOME HEALTH   Choice offered to / List presented to:          [facility] arranged  [place]   Status of service:  In process, will continue to follow Medicare Important Message given?   (If response is "NO", the following Medicare IM given date fields will be blank) Date Medicare IM given:   Medicare IM given by:   Date Additional Medicare IM given:   Additional Medicare IM given by:    Discharge Disposition:    Per UR Regulation:  Reviewed for med. necessity/level of care/duration of stay

## 2014-05-02 NOTE — Progress Notes (Signed)
Physical Therapy Evaluation Patient Details Name: Cathy Johnson MRN: [id number] DOB: [birth year] Today's Date: 05/02/2014   History of Present Illness  80 y.o. female s/p right unicompartmental knee replacement. Hx of HTN, stroke, and dementia.  Clinical Impression  Pt is s/p right unicompartmental knee replacement resulting in the deficits listed below (see PT Problem List). Min guard with most mobility post op day #0, but requires cues for safety. Reports she will have 24 hour care from her husband at home, but question his ability to assist patient physically. Will follow up tomorrow and monitor progression to ensure safety with d/c to home vs SNF. Pt will benefit from skilled PT to increase their independence and safety with mobility to allow discharge to the venue listed below.      Follow Up Recommendations Home health PT;Supervision/Assistance - 24 hour    Equipment Recommendations  3in1 (PT);Rolling walker with 5" wheels    Recommendations for Other Services OT consult     Precautions / Restrictions Precautions Precautions: Knee Precaution Comments: Reviewed knee precautions Required Braces or Orthoses: Knee Immobilizer - Right Knee Immobilizer - Right: On at all times (unless with PT or in CPM) Restrictions Weight Bearing Restrictions: Yes RLE Weight Bearing: Weight bearing as tolerated      Mobility  Bed Mobility Overal bed mobility: Needs Assistance Bed Mobility: Supine to Sit     Supine to sit: Supervision;HOB elevated     General bed mobility comments: Supervision with HOB elevated and use of rail. Did not require physical assist.  Transfers Overall transfer level: Needs assistance Equipment used: Rolling walker (2 wheeled) Transfers: Sit to/from [facility] Sit to Stand: Min guard Stand pivot transfers: Min guard       General transfer comment: Min guard for safety with VC for hand placement. Educated on safe use of DME for pivot from  bed to [facility]. Mod VC for walker placement with pivot. Poor control with sitting, but improved with instruction on second attempt to [facility].  Ambulation/Gait Ambulation/Gait assistance: Min guard Ambulation Distance (Feet): 4 Feet Assistive device: Rolling walker (2 wheeled) Gait Pattern/deviations: Step-to pattern;Decreased step length - left;Decreased stance time - right;Antalgic   Gait velocity interpretation: Below normal speed for age/gender General Gait Details: Instructions for sequencing of gait with rolling walker. Minimal right knee instability with knee immobilizer in place. VC for walker placement.  Stairs            Wheelchair Mobility    Modified Rankin (Stroke Patients Only)       Balance Overall balance assessment: Needs assistance Sitting-balance support: No upper extremity supported;Feet supported Sitting balance-Leahy Scale: Good     Standing balance support: Single extremity supported Standing balance-Leahy Scale: Poor                               Pertinent Vitals/Pain Pt with no complaints of pain at rest. Reports "some" pain during mobility Patient repositioned in chair for comfort. Reports nausea but no emesis Nurse aware      Home Living Family/patient expects to be discharged to:: Private residence Living Arrangements: Spouse/significant other Available Help at Discharge: Family;Available 24 hours/day Type of Home: House Home Access: Stairs to enter Entrance Stairs-Rails: Right;Left;Can reach both Entrance Stairs-Number of Steps: 4 Home Layout: One level;Laundry or work area in [employer]: None      Prior Function Level of Independence: Independent  Hand Dominance   Dominant Hand: Right    Extremity/Trunk Assessment   Upper Extremity Assessment: Defer to OT evaluation           Lower Extremity Assessment: RLE deficits/detail RLE Deficits / Details: decreased strength and ROM as  expected post op - reports normal sensation bilaterally       Communication   Communication: No difficulties  Cognition Arousal/Alertness: Awake/alert Behavior During Therapy: WFL for tasks assessed/performed Overall Cognitive Status: Within Functional Limits for tasks assessed                      General Comments General comments (skin integrity, edema, etc.): Pt with nausea (-) for emesis. Nurse aware.    Exercises Total Joint Exercises Ankle Circles/Pumps: AROM;Both;10 reps;Seated Quad Sets: AROM;Both;10 reps;Seated      Assessment/Plan    PT Assessment Patient needs continued PT services  PT Diagnosis Difficulty walking;Abnormality of gait;Acute pain   PT Problem List Decreased strength;Decreased range of motion;Decreased activity tolerance;Decreased balance;Decreased mobility;Decreased cognition;Decreased knowledge of use of DME;Decreased safety awareness;Decreased knowledge of precautions;Pain  PT Treatment Interventions DME instruction;Gait training;Stair training;Functional mobility training;Therapeutic activities;Therapeutic exercise;Balance training;Neuromuscular re-education;Cognitive remediation;Patient/family education;Modalities   PT Goals (Current goals can be found in the Care Plan section) Acute Rehab PT Goals Patient Stated Goal: To go home PT Goal Formulation: With patient Time For Goal Achievement: 05/09/14 Potential to Achieve Goals: Good    Frequency 7X/week   Barriers to discharge        Co-evaluation               End of Session Equipment Utilized During Treatment: Right knee immobilizer Activity Tolerance: Patient tolerated treatment well Patient left: in chair;with call bell/phone within reach;with family/visitor present Nurse Communication: Mobility status;Other (comment) (nausea)         Time: 7544-9201 PT Time Calculation (min): 24 min   Charges:     PT Treatments $Therapeutic Activity: 8-22 mins   PT G Codes:         Elayne Snare, [employer] [place]   Cathy Johnson 05/02/2014, 4:39 PM

## 2014-05-02 NOTE — Transfer of Care (Signed)
Immediate Anesthesia Transfer of Care Note  Patient: Cathy Johnson  Procedure(s) Performed: Procedure(s): UNICOMPARTMENTAL KNEE (Right)  Patient Location: PACU  Anesthesia Type:General  Level of Consciousness: awake, alert , oriented and sedated  Airway & Oxygen Therapy: Patient Spontanous Breathing and Patient connected to nasal cannula oxygen  Post-op Assessment: Report given to PACU RN, Post -op Vital signs reviewed and stable and Patient moving all extremities  Post vital signs: Reviewed and stable  Complications: No apparent anesthesia complications

## 2014-05-02 NOTE — Progress Notes (Signed)
Utilization review completed.  

## 2014-05-02 NOTE — Anesthesia Preprocedure Evaluation (Addendum)
Anesthesia Evaluation  Patient identified by MRN, date of birth, ID band Patient awake    Reviewed: Allergy & Precautions, H&P , NPO status , Patient's Chart, lab work & pertinent test results  Airway Mallampati: I TM Distance: >3 FB Neck ROM: Full    Dental   Pulmonary          Cardiovascular hypertension, Pt. on medications  TEE on 02/05/11 showed: Normal LV cavity size.  Mild concentric LVH. Normal LV systolic function, EF 07-12%, normal wall motion with no regional wall motion abnormalities. Trivial MR. LA is mildly dilated. Mild PR. Trivial TR. PFO was present with positive bubble study   Neuro/Psych H/O DementiaCVA    GI/Hepatic   Endo/Other    Renal/GU      Musculoskeletal   Abdominal   Peds  Hematology   Anesthesia Other Findings   Reproductive/Obstetrics                         Anesthesia Physical Anesthesia Plan  ASA: II  Anesthesia Plan: General   Post-op Pain Management:    Induction: Intravenous  Airway Management Planned: LMA  Additional Equipment:   Intra-op Plan:   Post-operative Plan: Extubation in OR  Informed Consent: I have reviewed the patients History and Physical, chart, labs and discussed the procedure including the risks, benefits and alternatives for the proposed anesthesia with the patient or authorized representative who has indicated his/her understanding and acceptance.     Plan Discussed with: CRNA and Surgeon  Anesthesia Plan Comments:         Anesthesia Quick Evaluation

## 2014-05-02 NOTE — Anesthesia Procedure Notes (Addendum)
Anesthesia Regional Block:  Adductor canal block  Pre-Anesthetic Checklist: ,, timeout performed, Correct Patient, Correct Site, Correct Laterality, Correct Procedure, Correct Position, site marked, Risks and benefits discussed,  Surgical consent,  Pre-op evaluation,  At surgeon's request and post-op pain management  Laterality: Right  Prep: chloraprep       Needles:  Injection technique: Single-shot  Needle Type: Echogenic Stimulator Needle     Needle Length: 9cm 9 cm Needle Gauge: 21 and 21 G    Additional Needles:  Procedures: ultrasound guided (picture in chart) Adductor canal block Narrative:  Start time: 05/02/2014 9:25 AM End time: 05/02/2014 9:35 AM Injection made incrementally with aspirations every 5 mL.  Performed by: Personally  Anesthesiologist: Lillia Abed MD  Additional Notes: Monitors applied. Patient sedated. Sterile prep and drape,hand hygiene and sterile gloves were used. Relevant anatomy identified.Needle position confirmed.Local anesthetic injected incrementally after negative aspiration. Local anesthetic spread visualized around nerve(s). Vascular puncture avoided. No complications. Image printed for medical record.The patient tolerated the procedure well.    Lillia Abed MD

## 2014-05-02 NOTE — Anesthesia Postprocedure Evaluation (Signed)
Anesthesia Post Note  Patient: Cathy Johnson  Procedure(s) Performed: Procedure(s) (LRB): UNICOMPARTMENTAL KNEE (Right)  Anesthesia type: general  Patient location: PACU  Post pain: Pain level controlled  Post assessment: Patient's Cardiovascular Status Stable  Last Vitals:  Filed Vitals:   05/02/14 1500  BP: 145/63  Pulse: 69  Temp: 36.3 C  Resp: 16    Post vital signs: Reviewed and stable  Level of consciousness: sedated  Complications: No apparent anesthesia complications

## 2014-05-02 NOTE — H&P (Signed)
PREOPERATIVE H&P  Chief Complaint: djd right knee  HPI: Cathy Johnson is a 80 y.o. female who presents for preoperative history and physical with a diagnosis of djd right knee. Symptoms are rated as moderate to severe, and have been worsening.  This is significantly impairing activities of daily living.  She has elected for surgical management. She has failed injections, activity modification, anti-inflammatories, and continues to have persistent severe symptoms. X-rays demonstrate end-stage anteromedial osteoarthritis.  Past Medical History  Diagnosis Date  . Hypertension   . Stroke   . Arthritis   . Dementia   . PFO (patent foramen ovale)     PFO by bubble study 02/05/11 TEE   Past Surgical History  Procedure Laterality Date  . Carpal tunnel release    . Breast surgery      LT BREAST MASS REMOVED  . Knee arthroscopy      RIGHT   History   Social History  . Marital Status: Married    Spouse Name: N/A    Number of Children: N/A  . Years of Education: N/A   Social History Main Topics  . Smoking status: Never Smoker   . Smokeless tobacco: None  . Alcohol Use: No  . Drug Use: No  . Sexual Activity: None   Other Topics Concern  . None   Social History Narrative  . None   History reviewed. No pertinent family history. No Known Allergies Prior to Admission medications   Medication Sig Start Date End Date Taking? Authorizing Provider  acetaminophen (TYLENOL) 650 MG CR tablet Take 650 mg by mouth every 8 (eight) hours as needed for pain.   Yes Historical Provider, MD  amLODipine (NORVASC) 5 MG tablet Take 5 mg by mouth daily.   Yes Historical Provider, MD  aspirin 162 MG EC tablet Take 162 mg by mouth daily.   Yes Historical Provider, MD  atorvastatin (LIPITOR) 40 MG tablet Take 40 mg by mouth daily.   Yes Historical Provider, MD  memantine (NAMENDA) 10 MG tablet Take 10 mg by mouth 2 (two) times daily.   Yes Historical Provider, MD  Risedronate Sodium (ATELVIA) 35 MG  TBEC Take 35 mg by mouth once a week. Every saturday   Yes Historical Provider, MD  Rivastigmine (EXELON TD) Place 1 patch onto the skin daily.   Yes Historical Provider, MD  telmisartan (MICARDIS) 40 MG tablet Take 40 mg by mouth 2 (two) times daily.   Yes Historical Provider, MD  Vitamin D, Ergocalciferol, (DRISDOL) 50000 UNITS CAPS capsule Take 50,000 Units by mouth every 7 (seven) days. Every Tuesday   Yes Historical Provider, MD     Positive ROS: All other systems have been reviewed and were otherwise negative with the exception of those mentioned in the HPI and as above.  Physical Exam: General: Alert, no acute distress Cardiovascular: No pedal edema Respiratory: No cyanosis, no use of accessory musculature GI: No organomegaly, abdomen is soft and non-tender Skin: No lesions in the area of chief complaint Neurologic: Sensation intact distally Psychiatric: Patient has some elements of memory loss, and dementia, and we have discussed the consent process at length with the patient and family members and husband. Lymphatic: No axillary or cervical lymphadenopathy  MUSCULOSKELETAL: Right knee has varus alignment, anteromedial pain, positive crepitance, range of motion 0-130  Assessment: Right knee anteromedial osteoarthritis, advanced age and mild.  Plan: Plan for Procedure(s): UNICOMPARTMENTAL KNEE  The risks benefits and alternatives were discussed with the patient including but not limited  to the risks of nonoperative treatment, versus surgical intervention including infection, bleeding, nerve injury,  blood clots, cardiopulmonary complications, morbidity, mortality, among others, and they were willing to proceed. We've also discussed the risks for worsening of dementia due to medications, and they're willing to proceed.  Johnny Bridge, MD Cell [phone]   05/02/2014 9:47 AM

## 2014-05-03 LAB — CBC
HCT: 33.4 % — ABNORMAL LOW (ref 36.0–46.0)
Hemoglobin: 11.2 g/dL — ABNORMAL LOW (ref 12.0–15.0)
MCH: 34.8 pg — AB (ref 26.0–34.0)
MCHC: 33.5 g/dL (ref 30.0–36.0)
MCV: 103.7 fL — AB (ref 78.0–100.0)
Platelets: 187 10*3/uL (ref 150–400)
RBC: 3.22 MIL/uL — ABNORMAL LOW (ref 3.87–5.11)
RDW: 16.2 % — AB (ref 11.5–15.5)
WBC: 7.1 10*3/uL (ref 4.0–10.5)

## 2014-05-03 LAB — BASIC METABOLIC PANEL
ANION GAP: 17 — AB (ref 5–15)
BUN: 11 mg/dL (ref 6–23)
CALCIUM: 8.7 mg/dL (ref 8.4–10.5)
CO2: 22 meq/L (ref 19–32)
Chloride: 100 mEq/L (ref 96–112)
Creatinine, Ser: 0.76 mg/dL (ref 0.50–1.10)
GFR calc non Af Amer: 75 mL/min — ABNORMAL LOW (ref >90)
GFR, EST AFRICAN AMERICAN: 87 mL/min — AB (ref >90)
Glucose, Bld: 161 mg/dL — ABNORMAL HIGH (ref 70–99)
Potassium: 4.2 mEq/L (ref 3.7–5.3)
Sodium: 139 mEq/L (ref 137–147)

## 2014-05-03 MED ORDER — KETOROLAC TROMETHAMINE 15 MG/ML IJ SOLN
INTRAMUSCULAR | Status: AC
  Filled 2014-05-03: qty 1

## 2014-05-03 NOTE — Progress Notes (Signed)
     Subjective:  Patient reports pain as mild.  Was able to get out of bed with PT.  Objective:   VITALS:   Filed Vitals:   05/02/14 1500 05/02/14 2014 05/03/14 0021 05/03/14 0510  BP: 145/63 124/67 140/67 135/70  Pulse: 69 88 88 74  Temp: 97.4 F (36.3 C) 97.8 F (36.6 C) 98.1 F (36.7 C) 98.3 F (36.8 C)  TempSrc:  Oral Oral Oral  Resp: 16 16 16 16   SpO2: 93% 92% 94% 95%    Neurologically intact Dorsiflexion/Plantar flexion intact Dressing clean, SLR intact.  Lab Results  Component Value Date   WBC 7.1 05/03/2014   HGB 11.2* 05/03/2014   HCT 33.4* 05/03/2014   MCV 103.7* 05/03/2014   PLT 187 05/03/2014     Assessment/Plan: 1 Day Post-Op   Principal Problem:   Osteoarthritis of right knee Active Problems:   Knee osteoarthritis   Advance diet Up with therapy Discharge home with home health If passes PT and pain controlled.  May need another day, will defer to family and patient.     Cathy Johnson 05/03/2014, 8:14 AM   Marchia Bond, MD Cell [phone]

## 2014-05-03 NOTE — Discharge Summary (Addendum)
Physician Discharge Summary  Patient ID: Cathy Johnson MRN: [id number] DOB/AGE: 1928/06/23 80 y.o.  Admit date: 05/02/2014 Discharge date: 05/04/2014  Admission Diagnoses:  Osteoarthritis of right knee  Discharge Diagnoses:  Principal Problem:   Osteoarthritis of right knee Active Problems:   Knee osteoarthritis   Past Medical History  Diagnosis Date  . Hypertension   . Stroke   . Arthritis   . Dementia   . PFO (patent foramen ovale)     PFO by bubble study 02/05/11 TEE  . Osteoarthritis of right knee 05/02/2014    Surgeries: Procedure(s): UNICOMPARTMENTAL KNEE on 05/02/2014   Consultants (if any):    Discharged Condition: Improved  Hospital Course: Cathy Johnson is an 80 y.o. female who was admitted 05/02/2014 with a diagnosis of Osteoarthritis of right knee and went to the operating room on 05/02/2014 and underwent the above named procedures.    She was given perioperative antibiotics:      Anti-infectives   Start     Dose/Rate Route Frequency Ordered Stop   05/02/14 1430  ceFAZolin (ANCEF) IVPB 2 g/50 mL premix     2 g 100 mL/hr over 30 Minutes Intravenous Every 6 hours 05/02/14 1411 05/02/14 2110   05/02/14 0600  ceFAZolin (ANCEF) IVPB 2 g/50 mL premix     2 g 100 mL/hr over 30 Minutes Intravenous On call to O.R. 05/01/14 1417 05/02/14 1009    .  She was given sequential compression devices, early ambulation, and xarelto for DVT prophylaxis.  She benefited maximally from the hospital stay and there were no complications.    Recent vital signs:  Filed Vitals:   05/04/14 0658  BP: 146/67  Pulse: 78  Temp: 99.2 F (37.3 C)  Resp: 18    Recent laboratory studies:  Lab Results  Component Value Date   HGB 11.2* 05/03/2014   HGB 11.4* 04/25/2014   HGB 13.6 02/01/2011   Lab Results  Component Value Date   WBC 7.1 05/03/2014   PLT 187 05/03/2014   Lab Results  Component Value Date   INR 1.03 04/25/2014   Lab Results  Component Value Date   NA 139  05/03/2014   K 4.2 05/03/2014   CL 100 05/03/2014   CO2 22 05/03/2014   BUN 11 05/03/2014   CREATININE 0.76 05/03/2014   GLUCOSE 161* 05/03/2014    Discharge Medications:     Medication List    STOP taking these medications       acetaminophen 650 MG CR tablet  Commonly known as:  TYLENOL     aspirin 162 MG EC tablet      TAKE these medications       amLODipine 5 MG tablet  Commonly known as:  NORVASC  Take 5 mg by mouth daily.     ATELVIA 35 MG Tbec  Generic drug:  Risedronate Sodium  Take 35 mg by mouth once a week. Every saturday     atorvastatin 40 MG tablet  Commonly known as:  LIPITOR  Take 40 mg by mouth daily.     baclofen 10 MG tablet  Commonly known as:  LIORESAL  Take 1 tablet (10 mg total) by mouth 3 (three) times daily. As needed for muscle spasm     EXELON TD  Place 1 patch onto the skin daily.     HYDROcodone-acetaminophen 10-325 MG per tablet  Commonly known as:  NORCO  Take 1-2 tablets by mouth every 6 (six) hours as needed.  memantine 10 MG tablet  Commonly known as:  NAMENDA  Take 10 mg by mouth 2 (two) times daily.     rivaroxaban 10 MG Tabs tablet  Commonly known as:  XARELTO  Take 1 tablet (10 mg total) by mouth daily.     sennosides-docusate sodium 8.6-50 MG tablet  Commonly known as:  SENOKOT-S  Take 2 tablets by mouth daily.     telmisartan 40 MG tablet  Commonly known as:  MICARDIS  Take 40 mg by mouth 2 (two) times daily.     Vitamin D (Ergocalciferol) 50000 UNITS Caps capsule  Commonly known as:  DRISDOL  Take 50,000 Units by mouth every 7 (seven) days. Every Tuesday        Diagnostic Studies: Dg Chest 2 View  04/25/2014   CLINICAL DATA:  Preop.  Hypertension.  EXAM: CHEST  2 VIEW  COMPARISON:  12/01/2009  FINDINGS: Heart, mediastinum hila are unremarkable. Lungs are mildly hyperexpanded but clear. No pleural effusion. No pneumothorax. The bony thorax is demineralized but intact.  IMPRESSION: No active cardiopulmonary  disease.   Electronically Signed   By: Lajean Manes M.D.   On: 04/25/2014 12:56   Dg Knee Right Port  05/02/2014   CLINICAL DATA:  Status post hemiarthroplasty.  EXAM: PORTABLE RIGHT KNEE - 1-2 VIEW  COMPARISON:  None.  FINDINGS: Status post left medial hemi arthroplasty. The femoral and tibial components appear well situated. Postsurgical gas is seen in anterior soft tissues.  IMPRESSION: Status post left medial hemi arthroplasty.   Electronically Signed   By: Sabino Dick M.D.   On: 05/02/2014 12:48    Disposition: 01-Home or Self Care    Follow-up Information   Follow up with Johnny Bridge, MD. Schedule an appointment as soon as possible for a visit in 2 weeks.   Specialty:  Orthopedic Surgery   Contact information:   [place] [address] [phone]        Signed: Johnny Bridge 05/04/2014, 7:01 AM

## 2014-05-03 NOTE — Progress Notes (Signed)
Physical Therapy Treatment Patient Details Name: Cathy Johnson MRN: [id number] DOB: May 27, 1928 Today's Date: 05/03/2014    History of Present Illness 80 y.o. female s/p right unicompartmental knee replacement. Hx of HTN, stroke, and dementia.    PT Comments    Pt was very eager to get out of bed and walk. Pt was able to amb much further today with no pain. Needed continuous vc due to dementia. Pt kept asking when she was going to get her surgery, because she did not remember ever getting it. Pt is very eager to walk as much as she can. Pt has very good strength in RLE and is able to perform exercises is min A. Plan on doing stair training next session.   Follow Up Recommendations  Home health PT;Supervision/Assistance - 24 hour     Equipment Recommendations  3in1 (PT);Rolling walker with 5" wheels    Recommendations for Other Services       Precautions / Restrictions Precautions Precautions: Knee Precaution Comments: Reviewed knee precautions Restrictions RLE Weight Bearing: Weight bearing as tolerated    Mobility  Bed Mobility Overal bed mobility: Needs Assistance Bed Mobility: Supine to Sit     Supine to sit: Supervision     General bed mobility comments: supervision with use of rail. did not require physical assist to stand. cues for scooting to edge of bed before standing.   Transfers Overall transfer level: Needs assistance Equipment used: Rolling walker (2 wheeled) Transfers: Sit to/from Stand Sit to Stand: Min guard         General transfer comment: min guard for safety. improved control with sitting with instruction.   Ambulation/Gait Ambulation/Gait assistance: Min guard Ambulation Distance (Feet): 100 Feet Assistive device: Rolling walker (2 wheeled) Gait Pattern/deviations: Step-to pattern;Decreased stride length Gait velocity: decreased Gait velocity interpretation: Below normal speed for age/gender General Gait Details: pt is starting to walk  with a step-through pattern, but not consistently. Pt tends to get ahead of her self and amb too fast for safety. needs vc to slow down. Needed repeated instruction and cues on sequencing with RW.   Stairs            Wheelchair Mobility    Modified Rankin (Stroke Patients Only)       Balance                                    Cognition Arousal/Alertness: Awake/alert Behavior During Therapy: WFL for tasks assessed/performed Overall Cognitive Status: Within Functional Limits for tasks assessed                      Exercises Total Joint Exercises Ankle Circles/Pumps: AROM;20 reps;Seated;Both Quad Sets: AROM;10 reps;Right;Seated Heel Slides: AAROM;Right;Seated;10 reps Hip ABduction/ADduction: AROM;Right;10 reps;Seated Straight Leg Raises: AROM;Right;Seated;10 reps Long Arc Quad: AROM;Seated;10 reps;Right    General Comments        Pertinent Vitals/Pain no apparent distress. Pt repositioned for comfort in recliner.      Home Living                      Prior Function            PT Goals (current goals can now be found in the care plan section) Progress towards PT goals: Progressing toward goals    Frequency  7X/week    PT Plan Current plan remains appropriate    Co-evaluation  End of Session Equipment Utilized During Treatment: Gait belt Activity Tolerance: Patient tolerated treatment well Patient left: in chair;with family/visitor present;with call bell/phone within reach     Time: 0927-0958 PT Time Calculation (min): 31 min  Charges:                       G CodesChrissie Noa, SPTA 05/03/2014, 10:13 AM

## 2014-05-03 NOTE — Progress Notes (Signed)
Seen and agreed 05/03/2014 Jacqualyn Posey PTA [phone] pager [phone] office

## 2014-05-03 NOTE — Discharge Instructions (Signed)
Diet: As you were doing prior to hospitalization  ° °Shower:  May shower but keep the wounds dry, use an occlusive plastic wrap, NO SOAKING IN TUB.  If the bandage gets wet, change with a clean dry gauze. ° °Dressing:  You may change your dressing 3-5 days after surgery.  Then change the dressing daily with sterile gauze dressing.   ° °There are sticky tapes (steri-strips) on your wounds and all the stitches are absorbable.  Leave the steri-strips in place when changing your dressings, they will peel off with time, usually 2-3 weeks. ° °Activity:  Increase activity slowly as tolerated, but follow the weight bearing instructions below.  No lifting or driving for 6 weeks. ° °Weight Bearing:   As tolerated.   ° °To prevent constipation: you may use a stool softener such as - ° °Colace (over the counter) 100 mg by mouth twice a day  °Drink plenty of fluids (prune juice may be helpful) and high fiber foods °Miralax (over the counter) for constipation as needed.   ° °Itching:  If you experience itching with your medications, try taking only a single pain pill, or even half a pain pill at a time.  You may take up to 10 pain pills per day, and you can also use benadryl over the counter for itching or also to help with sleep.  ° °Precautions:  If you experience chest pain or shortness of breath - call 911 immediately for transfer to the hospital emergency department!! ° °If you develop a fever greater that 101 F, purulent drainage from wound, increased redness or drainage from wound, or calf pain -- Call the office at [phone]                                                °Follow- Up Appointment:  Please call for an appointment to be seen in 2 weeks [place] - [phone] ° ° ° °Information on my medicine - XARELTO® (Rivaroxaban) ° °This medication education was reviewed with me or my healthcare representative as part of my discharge preparation. ° °Why was Xarelto® prescribed for you? °Xarelto® was prescribed for  you to reduce the risk of blood clots forming after orthopedic surgery. The medical term for these abnormal blood clots is venous thromboembolism (VTE). ° °What do you need to know about xarelto® ? °Take your Xarelto® ONCE DAILY at the same time every day. °You may take it either with or without food. ° °If you have difficulty swallowing the tablet whole, you may crush it and mix in applesauce just prior to taking your dose. ° °Take Xarelto® exactly as prescribed by your doctor and DO NOT stop taking Xarelto® without talking to the doctor who prescribed the medication.  Stopping without other VTE prevention medication to take the place of Xarelto® may increase your risk of developing a clot. ° °After discharge, you should have regular check-up appointments with your healthcare provider that is prescribing your Xarelto®.   ° °What do you do if you miss a dose? °If you miss a dose, take it as soon as you remember on the same day then continue your regularly scheduled once daily regimen the next day. Do not take two doses of Xarelto® on the same day.  ° °Important Safety Information °A possible side effect of Xarelto® is bleeding. You should call your healthcare provider   right away if you experience any of the following: °? Bleeding from an injury or your nose that does not stop. °? Unusual colored urine (red or dark brown) or unusual colored stools (red or black). °? Unusual bruising for unknown reasons. °? A serious fall or if you hit your head (even if there is no bleeding). ° °Some medicines may interact with Xarelto® and might increase your risk of bleeding while on Xarelto®. To help avoid this, consult your healthcare provider or pharmacist prior to using any new prescription or non-prescription medications, including herbals, vitamins, non-steroidal anti-inflammatory drugs (NSAIDs) and supplements. ° °This website has more information on Xarelto®: www.xarelto.com. ° ° ° °

## 2014-05-03 NOTE — Progress Notes (Signed)
Physical Therapy Treatment Patient Details Name: GAVRIELA CASHIN MRN: [id number] DOB: 01/14/1928 Today's Date: 05/03/2014    History of Present Illness 80 y.o. female s/p right unicompartmental knee replacement. Hx of HTN, stroke, and dementia.    PT Comments    Pt did not remember tx this AM, but gait speed was increased and she was very anxious to get up and moving. Pts husband request she be D/C Friday, RN made aware. Husband agreed for her to go home tomorrow, but said she needs "a sitter" or "bed alarm" because "she gets up and walks around on her own". Plan to practice stair training next session.    Follow Up Recommendations  Home health PT;Supervision/Assistance - 24 hour     Equipment Recommendations  3in1 (PT);Rolling walker with 5" wheels    Recommendations for Other Services       Precautions / Restrictions Precautions Precautions: Knee Restrictions RLE Weight Bearing: Weight bearing as tolerated    Mobility  Bed Mobility Overal bed mobility: Needs Assistance Bed Mobility: Supine to Sit     Supine to sit: Supervision     General bed mobility comments: supervision with use of rail. did not require physical assist to stand. cues for scooting to edge of bed before standing.  Transfers Overall transfer level: Needs assistance Equipment used: Rolling walker (2 wheeled) Transfers: Sit to/from Stand Sit to Stand: Supervision         General transfer comment: supervision for safety and cues for hand placement when going to standing.   Ambulation/Gait Ambulation/Gait assistance: Supervision Ambulation Distance (Feet): 100 Feet Assistive device: Rolling walker (2 wheeled) Gait Pattern/deviations: Step-through pattern Gait velocity: increasing Gait velocity interpretation: Below normal speed for age/gender General Gait Details: pt amb with step-through pattern with increased speed. Pt still letting walker get too far ahead of her. needed repeated instruction  and cues on sequencing with RW.   Stairs            Wheelchair Mobility    Modified Rankin (Stroke Patients Only)       Balance                                    Cognition Arousal/Alertness: Awake/alert Behavior During Therapy: WFL for tasks assessed/performed Overall Cognitive Status: Within Functional Limits for tasks assessed                      Exercises Total Joint Exercises Ankle Circles/Pumps: Both;AROM;20 reps;Seated Quad Sets: AROM;Right;Seated;10 reps Heel Slides: AAROM;Right;Seated;10 reps Hip ABduction/ADduction: AROM;Right;Seated;10 reps Straight Leg Raises: AROM;Right;Seated;10 reps Long Arc Quad: AROM;Seated;Right;10 reps    General Comments        Pertinent Vitals/Pain no apparent distress. Pt repositioned in recliner for comfort.     Home Living                      Prior Function            PT Goals (current goals can now be found in the care plan section) Progress towards PT goals: Progressing toward goals    Frequency  7X/week    PT Plan Current plan remains appropriate    Co-evaluation             End of Session Equipment Utilized During Treatment: Gait belt Activity Tolerance: Patient tolerated treatment well Patient left: in chair;with family/visitor present;with call bell/phone within reach  Time: 1335-1406 PT Time Calculation (min): 31 min  Charges:  $Gait Training: 8-22 mins $Therapeutic Exercise: 8-22 mins                    G Codes:      BRASFIELD,Emmalynn Pinkham,SPTA 05/03/2014, 2:24 PM

## 2014-05-03 NOTE — Progress Notes (Signed)
Seen and agreed 05/03/2014 Jacqualyn Posey PTA [phone] pager [phone] office

## 2014-05-03 NOTE — Evaluation (Signed)
Occupational Therapy Evaluation Patient Details Name: Cathy Johnson MRN: [id number] DOB: [birth year] Today's Date: 05/03/2014    History of Present Illness 80 y.o. female s/p right unicompartmental knee replacement. Hx of HTN, stroke, and dementia.   Clinical Impression   Patient presents initially without pain in right knee, although when balance challenged in standing, patient with mild twinge of pain in knee, fleeting, not scored.  Patient with decreased stand balance, and decreased strength and range of motion in right leg which impede independence with BADL.  Husband present for OT eval and able to assist patient as needed at home.  Patient will benefit from skilled OT intervention to address increased independence and safety with BADL's.      Follow Up Recommendations       Equipment Recommendations       Recommendations for Other Services       Precautions / Restrictions Precautions Precautions: Fall;Knee Restrictions Weight Bearing Restrictions: Yes RLE Weight Bearing: Weight bearing as tolerated      Mobility Bed Mobility Overal bed mobility: Needs Assistance Bed Mobility: Supine to Sit     Supine to sit: Supervision     General bed mobility comments: supervision with use of rail. did not require physical assist to stand. cues for scooting to edge of bed before standing.  Transfers Overall transfer level: Needs assistance Equipment used: Rolling walker (2 wheeled) Transfers: Sit to/from Stand Sit to Stand: Supervision Stand pivot transfers: Min guard       General transfer comment: supervision for safety and cues for hand placement when going to standing.     Balance   Sitting-balance support: No upper extremity supported;Feet supported Sitting balance-Leahy Scale: Good     Standing balance support: No upper extremity supported;During functional activity Standing balance-Leahy Scale: Poor                              ADL Overall  ADL's : Needs assistance/impaired Eating/Feeding: Set up;Sitting   Grooming: Wash/dry hands;Wash/dry face;Oral care;Applying deodorant;Brushing hair;Sitting;Set up   Upper Body Bathing: Set up;Sitting   Lower Body Bathing: Supervison/ safety;Sit to/from stand   Upper Body Dressing : Set up;Sitting   Lower Body Dressing: Minimal assistance;Sit to/from stand   Toilet Transfer: Supervision/safety;BSC;RW   Toileting- Water quality scientist and Hygiene: Min guard;Sit to/from stand       Functional mobility during ADLs: Passenger transport manager Tested?: No   Praxis Praxis Praxis tested?: Not tested    Pertinent Vitals/Pain Denies pain     Hand Dominance Right   Extremity/Trunk Assessment Upper Extremity Assessment Upper Extremity Assessment: Overall WFL for tasks assessed   Lower Extremity Assessment Lower Extremity Assessment: Defer to PT evaluation   Cervical / Trunk Assessment Cervical / Trunk Assessment: Normal   Communication Communication Communication: No difficulties (dementia)   Cognition Arousal/Alertness: Awake/alert Behavior During Therapy: WFL for tasks assessed/performed Overall Cognitive Status: History of cognitive impairments - at baseline                     General Comments       Exercises       Shoulder Instructions      Home Living Family/patient expects to be discharged to:: Private residence Living Arrangements: Spouse/significant other Available Help at Discharge: Family;Available  24 hours/day Type of Home: House Home Access: Stairs to enter [employer] of Steps: 4 Entrance Stairs-Rails: Right;Left;Can reach both Home Layout: One level;Laundry or work area in basement     [employer]: Tub only;Walk-in shower         Home Equipment: Bedside commode (husband has BSC that patient can use)          Prior Functioning/Environment Level  of Independence: Independent             OT Diagnosis:     OT Problem List:     OT Treatment/Interventions:      OT Goals(Current goals can be found in the care plan section)    OT Frequency:     Barriers to D/C:            Co-evaluation              End of Session    Activity Tolerance:   Patient left:     Time:  -    Charges:    G-Codes:    Mariah Milling 05/12/14, 2:34 PM

## 2014-05-04 LAB — CBC
HEMATOCRIT: 28.7 % — AB (ref 36.0–46.0)
Hemoglobin: 9.6 g/dL — ABNORMAL LOW (ref 12.0–15.0)
MCH: 34.7 pg — ABNORMAL HIGH (ref 26.0–34.0)
MCHC: 33.4 g/dL (ref 30.0–36.0)
MCV: 103.6 fL — ABNORMAL HIGH (ref 78.0–100.0)
PLATELETS: 181 10*3/uL (ref 150–400)
RBC: 2.77 MIL/uL — ABNORMAL LOW (ref 3.87–5.11)
RDW: 16.6 % — AB (ref 11.5–15.5)
WBC: 10.9 10*3/uL — ABNORMAL HIGH (ref 4.0–10.5)

## 2014-05-04 NOTE — Progress Notes (Signed)
D/C instructions and scripts given to Pt and her husband. Instructions reviewed and signed by Pts husband due to Pts confusion and dementia. Husband understood all her instructions and felt comfortable with her D/C to home at this time.

## 2014-05-04 NOTE — Care Management Note (Signed)
CARE MANAGEMENT NOTE 05/04/2014  Patient:  Cathy Johnson, Cathy Johnson   Account Number:  [id number]  Date Initiated:  05/02/2014  Documentation initiated by:  Ricki Miller  Subjective/Objective Assessment:   80 yr old female s/p right unicompartmental knee     Action/Plan:   PT/OT eval  patient preoperatively setup with [facility] health, no changes.  Case manager spoke with patient and husband concerning home health needs at discharge. Patient states they have RW and 3in1.   Anticipated DC Date:  05/03/2014   Anticipated DC Plan:  [place]  CM consult      [facility] Choice  HOME HEALTH   Choice offered to / List presented to:  C-3 Spouse        HH arranged  HH-2 PT  HH-1 RN  [place]      [place]   Status of service:  Completed, signed off Medicare Important Message given?  NA - LOS <3 / Initial given by admissions (If response is "NO", the following Medicare IM given date fields will be blank) Date Medicare IM given:   Medicare IM given by:   Date Additional Medicare IM given:   Additional Medicare IM given by:    Discharge Disposition:  [place]  Per UR Regulation:  Reviewed for med. necessity/level of care/duration of stay  If discussed at [place] of Stay Meetings, dates discussed:    Comments:

## 2014-05-04 NOTE — Progress Notes (Signed)
Physical Therapy Treatment Patient Details Name: ABRAHAM MARGULIES MRN: [id number] DOB: 07/25/1928 Today's Date: 05/04/2014    History of Present Illness 80 y.o. female s/p right unicompartmental knee replacement. Hx of HTN, stroke, and dementia.    PT Comments    Pt still very confused and disoriented. Pt very eager and anxious to get up and walk. Pt was able to amb a greater distance today. Practiced stair training this session, and educated husband on stair sequencing technique to help pt when they are home. Pt planning on D/C today to home with husband.   Follow Up Recommendations  Home health PT;Supervision/Assistance - 24 hour     Equipment Recommendations  3in1 (PT);Rolling walker with 5" wheels    Recommendations for Other Services       Precautions / Restrictions Precautions Precautions: Knee;Fall Precaution Comments: Reviewed knee precautions Restrictions RLE Weight Bearing: Weight bearing as tolerated    Mobility  Bed Mobility Overal bed mobility: Needs Assistance       Supine to sit: Supervision     General bed mobility comments: supervision with use of rail. did not require physical assist to stand. cues for scooting to edge of bed before standing.  Transfers Overall transfer level: Needs assistance Equipment used: Rolling walker (2 wheeled) Transfers: Sit to/from Stand Sit to Stand: Min guard         General transfer comment: min guard for safety and cues for hand placements.  Ambulation/Gait Ambulation/Gait assistance: Supervision Ambulation Distance (Feet): 150 Feet Assistive device: Rolling walker (2 wheeled) Gait Pattern/deviations: Step-through pattern Gait velocity: increased Gait velocity interpretation: Below normal speed for age/gender General Gait Details: pt amb with increased speed of a step through pattern. Pt still needs verbal cues to not let walker get too ahead of her.    Stairs Stairs: Yes Stairs assistance: Min  guard Stair Management: Two rails;Step to pattern;Forwards Number of Stairs: 5 General stair comments: pt needed constant verbal cues for sequencing. pt was able to handle stairs well and had a good understanding of proper technique and sequencing after reviewing multiple times. Reviewed sequencing with husband.   Wheelchair Mobility    Modified Rankin (Stroke Patients Only)       Balance                                    Cognition Arousal/Alertness: Awake/alert Behavior During Therapy: WFL for tasks assessed/performed Overall Cognitive Status: History of cognitive impairments - at baseline                      Exercises Total Joint Exercises Ankle Circles/Pumps: AROM;20 reps;Seated Quad Sets: AROM;10 reps;Right;Seated Heel Slides: AAROM;Right;Seated;10 reps Hip ABduction/ADduction: AROM;Right;10 reps;Seated Straight Leg Raises: AROM;Seated;Right;10 reps Long Arc Quad: AROM;Seated;Right;10 reps    General Comments        Pertinent Vitals/Pain Pt said stated, "I am having no pain while you are in here". Pt repositioned in bed in supine for comfort.      Home Living                      Prior Function            PT Goals (current goals can now be found in the care plan section) Progress towards PT goals: Progressing toward goals    Frequency  7X/week    PT Plan Current plan remains appropriate  Co-evaluation             End of Session Equipment Utilized During Treatment: Gait belt Activity Tolerance: Patient tolerated treatment well Patient left: in bed;with family/visitor present;with call bell/phone within reach     Time: 0839-0905 PT Time Calculation (min): 26 min  Charges:                       G Codes:      BRASFIELD,Camarie Mctigue,SPTA 05/04/2014, 9:32 AM

## 2014-05-04 NOTE — Progress Notes (Addendum)
     Subjective:  Patient reports pain as mild.  Significant confusion overnight, disorientation, dimentia at baseline.  Objective:   VITALS:   Filed Vitals:   05/03/14 1123 05/03/14 1524 05/03/14 2142 05/04/14 0658  BP:   159/72 146/67  Pulse:   98 78  Temp:   97.9 F (36.6 C) 99.2 F (37.3 C)  TempSrc:   Oral Oral  Resp: 18 18 18 18   SpO2:   99% 95%    Neurologically intact Dorsiflexion/Plantar flexion intact Incision: dressing C/D/I and no drainage   Lab Results  Component Value Date   WBC 7.1 05/03/2014   HGB 11.2* 05/03/2014   HCT 33.4* 05/03/2014   MCV 103.7* 05/03/2014   PLT 187 05/03/2014     Assessment/Plan: 2 Days Post-Op   Principal Problem:   Osteoarthritis of right knee Active Problems:   Knee osteoarthritis   Advance diet Up with therapy Discharge home with home health Confusion likely from abnormal setting, not much more confusion compared to baseline, expect improvement at home.     Cathy Johnson P 05/04/2014, 7:02 AM   Marchia Bond, MD Cell [phone]

## 2014-05-04 NOTE — Progress Notes (Signed)
Seen and agreed 05/04/2014 Robinette, Julia Elizabeth PTA [phone] pager [phone] office    

## 2014-05-05 ENCOUNTER — Encounter (HOSPITAL_COMMUNITY): Payer: Self-pay | Admitting: Orthopedic Surgery

## 2014-05-05 DIAGNOSIS — F0390 Unspecified dementia without behavioral disturbance: Secondary | ICD-10-CM | POA: Diagnosis not present

## 2014-05-05 DIAGNOSIS — Z7901 Long term (current) use of anticoagulants: Secondary | ICD-10-CM | POA: Diagnosis not present

## 2014-05-05 DIAGNOSIS — Z96659 Presence of unspecified artificial knee joint: Secondary | ICD-10-CM | POA: Diagnosis not present

## 2014-05-05 DIAGNOSIS — Z471 Aftercare following joint replacement surgery: Secondary | ICD-10-CM | POA: Diagnosis not present

## 2014-05-05 DIAGNOSIS — I10 Essential (primary) hypertension: Secondary | ICD-10-CM | POA: Diagnosis not present

## 2014-05-05 DIAGNOSIS — M1710 Unilateral primary osteoarthritis, unspecified knee: Secondary | ICD-10-CM | POA: Diagnosis not present

## 2014-05-05 DIAGNOSIS — M129 Arthropathy, unspecified: Secondary | ICD-10-CM | POA: Diagnosis not present

## 2014-05-17 DIAGNOSIS — M1710 Unilateral primary osteoarthritis, unspecified knee: Secondary | ICD-10-CM | POA: Diagnosis not present

## 2014-06-12 DIAGNOSIS — H524 Presbyopia: Secondary | ICD-10-CM | POA: Diagnosis not present

## 2014-06-12 DIAGNOSIS — H52209 Unspecified astigmatism, unspecified eye: Secondary | ICD-10-CM | POA: Diagnosis not present

## 2014-06-12 DIAGNOSIS — Z961 Presence of intraocular lens: Secondary | ICD-10-CM | POA: Diagnosis not present

## 2014-07-07 ENCOUNTER — Emergency Department (INDEPENDENT_AMBULATORY_CARE_PROVIDER_SITE_OTHER): Payer: Medicare Other

## 2014-07-07 ENCOUNTER — Emergency Department (INDEPENDENT_AMBULATORY_CARE_PROVIDER_SITE_OTHER)
Admission: EM | Admit: 2014-07-07 | Discharge: 2014-07-07 | Disposition: A | Discharge status: Home / Self Care | Payer: Medicare Other | Source: Home / Self Care | Attending: Family Medicine | Admitting: Family Medicine

## 2014-07-07 ENCOUNTER — Encounter (HOSPITAL_COMMUNITY): Payer: Self-pay | Admitting: Emergency Medicine

## 2014-07-07 DIAGNOSIS — M19019 Primary osteoarthritis, unspecified shoulder: Secondary | ICD-10-CM

## 2014-07-07 DIAGNOSIS — M19012 Primary osteoarthritis, left shoulder: Secondary | ICD-10-CM

## 2014-07-07 DIAGNOSIS — M19049 Primary osteoarthritis, unspecified hand: Secondary | ICD-10-CM | POA: Diagnosis not present

## 2014-07-07 DIAGNOSIS — M19042 Primary osteoarthritis, left hand: Secondary | ICD-10-CM

## 2014-07-07 MED ORDER — IBUPROFEN 800 MG PO TABS
ORAL_TABLET | ORAL | Status: AC
  Filled 2014-07-07: qty 1

## 2014-07-07 MED ORDER — IBUPROFEN 800 MG PO TABS
800.0000 mg | ORAL_TABLET | Freq: Once | ORAL | Status: AC
  Administered 2014-07-07: 800 mg via ORAL

## 2014-07-07 NOTE — ED Provider Notes (Signed)
CSN: [id number]     Arrival date & time 07/07/14  1626 History   First MD Initiated Contact with Patient 07/07/14 1746     Chief Complaint  Patient presents with  . Shoulder Pain   (Consider location/radiation/quality/duration/timing/severity/associated sxs/prior Treatment) Patient is a 80 y.o. female presenting with shoulder pain. The history is provided by the spouse. The history is limited by the condition of the patient.  Shoulder Pain This is a new problem. The problem occurs constantly. The problem has not changed since onset.Pertinent negatives include no chest pain, no abdominal pain, no headaches and no shortness of breath. The symptoms are aggravated by bending. She has tried nothing for the symptoms.  Pt is an 80 y/o female w/ known h/o HTN, CVA and baseline dementia who presents w/ husband, Husband states he believes pt may have fallen today while walking in the yard. The fall was not witnessed but since she was outside she has c/o (L) shoulder and (L) hand pain. She also has 2 bruises on her LUE. Pt is not a reliable historian given baseline dementia and reports she has "no memory" of falling.   Past Medical History  Diagnosis Date  . Hypertension   . Stroke   . Arthritis   . Dementia   . PFO (patent foramen ovale)     PFO by bubble study 02/05/11 TEE  . Osteoarthritis of right knee 05/02/2014   Past Surgical History  Procedure Laterality Date  . Carpal tunnel release    . Breast surgery      LT BREAST MASS REMOVED  . Knee arthroscopy      RIGHT  . Partial knee arthroplasty Right 05/02/2014    Procedure: UNICOMPARTMENTAL KNEE;  Surgeon: Johnny Bridge, MD;  Location: [place];  Service: Orthopedics;  Laterality: Right;   No family history on file. History  Substance Use Topics  . Smoking status: Never Smoker   . Smokeless tobacco: Not on file  . Alcohol Use: No   OB History   Grav Para Term Preterm Abortions TAB SAB Ect Mult Living                 Review of  Systems  Unable to perform ROS Respiratory: Negative for shortness of breath.   Cardiovascular: Negative for chest pain.  Gastrointestinal: Negative for abdominal pain.  Neurological: Negative for headaches.    Allergies  Review of patient's allergies indicates no known allergies.  Home Medications   Prior to Admission medications   Medication Sig Start Date End Date Taking? Authorizing Provider  amLODipine (NORVASC) 5 MG tablet Take 5 mg by mouth daily.    Historical Provider, MD  atorvastatin (LIPITOR) 40 MG tablet Take 40 mg by mouth daily.    Historical Provider, MD  baclofen (LIORESAL) 10 MG tablet Take 1 tablet (10 mg total) by mouth 3 (three) times daily. As needed for muscle spasm 05/02/14   Johnny Bridge, MD  HYDROcodone-acetaminophen [facility]) 10-325 MG per tablet Take 1-2 tablets by mouth every 6 (six) hours as needed. 05/02/14   Johnny Bridge, MD  memantine (NAMENDA) 10 MG tablet Take 10 mg by mouth 2 (two) times daily.    Historical Provider, MD  Risedronate Sodium (ATELVIA) 35 MG TBEC Take 35 mg by mouth once a week. Every saturday    Historical Provider, MD  rivaroxaban (XARELTO) 10 MG TABS tablet Take 1 tablet (10 mg total) by mouth daily. 05/02/14   Johnny Bridge, MD  Rivastigmine (EXELON  TD) Place 1 patch onto the skin daily.    Historical Provider, MD  sennosides-docusate sodium (SENOKOT-S) 8.6-50 MG tablet Take 2 tablets by mouth daily. 05/02/14   Johnny Bridge, MD  telmisartan (MICARDIS) 40 MG tablet Take 40 mg by mouth 2 (two) times daily.    Historical Provider, MD  Vitamin D, Ergocalciferol, (DRISDOL) 50000 UNITS CAPS capsule Take 50,000 Units by mouth every 7 (seven) days. Every Tuesday    Historical Provider, MD   BP 156/84  Pulse 96  Temp(Src) 97.9 F (36.6 C) (Oral)  Resp 16  SpO2 95% Physical Exam  Nursing note and vitals reviewed. Constitutional: She appears well-developed and well-nourished. She is cooperative.  Non-toxic appearance. She does not  have a sickly appearance. She does not appear ill. No distress.  HENT:  Head: Normocephalic and atraumatic.  Neck: Normal range of motion. Neck supple. No spinous process tenderness and no muscular tenderness present.  Cardiovascular: Normal rate and regular rhythm.   Pulmonary/Chest: Effort normal and breath sounds normal.  Musculoskeletal:       Arms: Neurological: She is alert. No cranial nerve deficit. Coordination normal.  Oriented to person.    ED Course  Procedures (including critical care time) Labs Review Labs Reviewed - No data to display  Imaging Review Dg Shoulder Left  07/07/2014   CLINICAL DATA:  Left shoulder pain.  EXAM: LEFT SHOULDER - 2+ VIEW  COMPARISON:  11/24/2012  FINDINGS: Mild degenerative changes in the left AC joint. Glenohumeral joint is intact. No acute bony abnormality. Specifically, no fracture, subluxation, or dislocation. Soft tissues are intact.  IMPRESSION: No acute bony abnormality.   Electronically Signed   By: Rolm Baptise M.D.   On: 07/07/2014 18:41   Dg Hand Complete Left  07/07/2014   CLINICAL DATA:  Left hand pain.  EXAM: LEFT HAND - COMPLETE 3+ VIEW  COMPARISON:  None.  FINDINGS: Mild joint space narrowing within the IP joints and first carpometacarpal joint compatible with osteoarthritis. No bony erosions. No fracture, subluxation or dislocation. Soft tissues are intact.  IMPRESSION: No acute bony abnormality. Mild osteoarthritic changes within the left hand/wrist.   Electronically Signed   By: Rolm Baptise M.D.   On: 07/07/2014 18:42     MDM   1. Osteoarthritis of left shoulder, unspecified osteoarthritis type   2. Osteoarthritis of left hand, unspecified osteoarthritis type    It is unclear if pt actually fell. There are no signs of trauma to (L) hand and shoulder and she has no memory of falling. 2 small contusions to LUE that do not appear to be new. Mild pain to (L) shoulder w/ raising (L) arm from side forward. Mild pain in (L) hand w/  palpation and gripping. No swelling or redness to either location. Imaging negative for fx, + for mild degenerative changes. Pt and spouse encouraged to use Ibuprofen for pain and her Oxycodone/APA as previously instructed for more severe pain. They were also encouraged to arrange f/u with her PCP if pain persist or worsens. Husband verbalizes understanding of instructions and f/u recommendations.     Jeryl Columbia, NP 07/07/14 1922

## 2014-07-07 NOTE — ED Notes (Signed)
Patient complains of pain to her left shoulder

## 2014-07-07 NOTE — Discharge Instructions (Signed)
Your xrays today show some mild arthritis in your left shoulder and left hand but no broken bones. Take Ibuprofen 400-600 mg 2 to 3 times a day as needed for pain. Use your stronger pain medication as previously directed for more severe pain. Please arrange follow up with Dr Joylene Draft if pain persist or worsens.   Arthritis, Nonspecific Arthritis is pain, redness, warmth, or puffiness (inflammation) of a joint. The joint may be stiff or hurt when you move it. One or more joints may be affected. There are many types of arthritis. Your doctor may not know what type you have right away. The most common cause of arthritis is wear and tear on the joint (osteoarthritis). HOME CARE   Only take medicine as told by your doctor.  Rest the joint as much as possible.  Raise (elevate) your joint if it is puffy.  Use crutches if the painful joint is in your leg.  Drink enough fluids to keep your pee (urine) clear or pale yellow.  Follow your doctor's diet instructions.  Use cold packs for very bad joint pain for 10 to 15 minutes every hour. Ask your doctor if it is okay for you to use hot packs.  Exercise as told by your doctor.  Take a warm shower if you have stiffness in the morning.  Move your sore joints throughout the day. GET HELP RIGHT AWAY IF:   You have a fever.  You have very bad joint pain, puffiness, or redness.  You have many joints that are painful and puffy.  You are not getting better with treatment.  You have very bad back pain or leg weakness.  You cannot control when you poop (bowel movement) or pee (urinate).  You do not feel better in 24 hours or are getting worse.  You are having side effects from your medicine. MAKE SURE YOU:   Understand these instructions.  Will watch your condition.  Will get help right away if you are not doing well or get worse. Document Released: 12/31/2009 Document Revised: 04/06/2012 Document Reviewed: 12/31/2009 [facility] Patient  Information 2015 [place], [place]. This information is not intended to replace advice given to you by your health care provider. Make sure you discuss any questions you have with your health care provider.

## 2014-07-07 NOTE — ED Provider Notes (Signed)
Medical screening examination/treatment/procedure(s) were performed by resident physician or non-physician practitioner and as supervising physician I was immediately available for consultation/collaboration.   Pauline Good MD.   Billy Fischer, MD 07/07/14 [phone]

## 2014-07-18 DIAGNOSIS — S43429A Sprain of unspecified rotator cuff capsule, initial encounter: Secondary | ICD-10-CM | POA: Diagnosis not present

## 2014-08-16 DIAGNOSIS — S43422A Sprain of left rotator cuff capsule, initial encounter: Secondary | ICD-10-CM | POA: Diagnosis not present

## 2014-08-30 DIAGNOSIS — E785 Hyperlipidemia, unspecified: Secondary | ICD-10-CM | POA: Diagnosis not present

## 2014-08-30 DIAGNOSIS — I10 Essential (primary) hypertension: Secondary | ICD-10-CM | POA: Diagnosis not present

## 2014-08-30 DIAGNOSIS — D649 Anemia, unspecified: Secondary | ICD-10-CM | POA: Diagnosis not present

## 2014-08-30 DIAGNOSIS — E538 Deficiency of other specified B group vitamins: Secondary | ICD-10-CM | POA: Diagnosis not present

## 2014-08-30 DIAGNOSIS — E559 Vitamin D deficiency, unspecified: Secondary | ICD-10-CM | POA: Diagnosis not present

## 2014-08-30 DIAGNOSIS — Z008 Encounter for other general examination: Secondary | ICD-10-CM | POA: Diagnosis not present

## 2014-09-07 DIAGNOSIS — M5030 Other cervical disc degeneration, unspecified cervical region: Secondary | ICD-10-CM | POA: Diagnosis not present

## 2014-09-07 DIAGNOSIS — R809 Proteinuria, unspecified: Secondary | ICD-10-CM | POA: Diagnosis not present

## 2014-09-07 DIAGNOSIS — Z23 Encounter for immunization: Secondary | ICD-10-CM | POA: Diagnosis not present

## 2014-09-07 DIAGNOSIS — Z0000 Encounter for general adult medical examination without abnormal findings: Secondary | ICD-10-CM | POA: Diagnosis not present

## 2014-09-07 DIAGNOSIS — F0390 Unspecified dementia without behavioral disturbance: Secondary | ICD-10-CM | POA: Diagnosis not present

## 2014-09-07 DIAGNOSIS — E538 Deficiency of other specified B group vitamins: Secondary | ICD-10-CM | POA: Diagnosis not present

## 2014-09-07 DIAGNOSIS — Z1389 Encounter for screening for other disorder: Secondary | ICD-10-CM | POA: Diagnosis not present

## 2014-09-07 DIAGNOSIS — I639 Cerebral infarction, unspecified: Secondary | ICD-10-CM | POA: Diagnosis not present

## 2014-09-07 DIAGNOSIS — H9190 Unspecified hearing loss, unspecified ear: Secondary | ICD-10-CM | POA: Diagnosis not present

## 2014-09-07 DIAGNOSIS — Q211 Atrial septal defect: Secondary | ICD-10-CM | POA: Diagnosis not present

## 2014-09-08 DIAGNOSIS — F329 Major depressive disorder, single episode, unspecified: Secondary | ICD-10-CM | POA: Diagnosis not present

## 2014-09-08 DIAGNOSIS — F0390 Unspecified dementia without behavioral disturbance: Secondary | ICD-10-CM | POA: Diagnosis not present

## 2014-09-08 DIAGNOSIS — I10 Essential (primary) hypertension: Secondary | ICD-10-CM | POA: Diagnosis not present

## 2014-09-08 DIAGNOSIS — D511 Vitamin B12 deficiency anemia due to selective vitamin B12 malabsorption with proteinuria: Secondary | ICD-10-CM | POA: Diagnosis not present

## 2014-09-13 DIAGNOSIS — F0390 Unspecified dementia without behavioral disturbance: Secondary | ICD-10-CM | POA: Diagnosis not present

## 2014-09-13 DIAGNOSIS — D511 Vitamin B12 deficiency anemia due to selective vitamin B12 malabsorption with proteinuria: Secondary | ICD-10-CM | POA: Diagnosis not present

## 2014-09-20 DIAGNOSIS — D511 Vitamin B12 deficiency anemia due to selective vitamin B12 malabsorption with proteinuria: Secondary | ICD-10-CM | POA: Diagnosis not present

## 2014-09-20 DIAGNOSIS — F0390 Unspecified dementia without behavioral disturbance: Secondary | ICD-10-CM | POA: Diagnosis not present

## 2014-09-27 DIAGNOSIS — D511 Vitamin B12 deficiency anemia due to selective vitamin B12 malabsorption with proteinuria: Secondary | ICD-10-CM | POA: Diagnosis not present

## 2014-09-27 DIAGNOSIS — F0390 Unspecified dementia without behavioral disturbance: Secondary | ICD-10-CM | POA: Diagnosis not present

## 2014-10-04 DIAGNOSIS — F0390 Unspecified dementia without behavioral disturbance: Secondary | ICD-10-CM | POA: Diagnosis not present

## 2014-10-04 DIAGNOSIS — D511 Vitamin B12 deficiency anemia due to selective vitamin B12 malabsorption with proteinuria: Secondary | ICD-10-CM | POA: Diagnosis not present

## 2014-11-02 DIAGNOSIS — D511 Vitamin B12 deficiency anemia due to selective vitamin B12 malabsorption with proteinuria: Secondary | ICD-10-CM | POA: Diagnosis not present

## 2014-11-02 DIAGNOSIS — F0390 Unspecified dementia without behavioral disturbance: Secondary | ICD-10-CM | POA: Diagnosis not present

## 2015-03-13 DIAGNOSIS — I639 Cerebral infarction, unspecified: Secondary | ICD-10-CM | POA: Diagnosis not present

## 2015-03-13 DIAGNOSIS — E559 Vitamin D deficiency, unspecified: Secondary | ICD-10-CM | POA: Diagnosis not present

## 2015-03-13 DIAGNOSIS — Z1389 Encounter for screening for other disorder: Secondary | ICD-10-CM | POA: Diagnosis not present

## 2015-03-13 DIAGNOSIS — E538 Deficiency of other specified B group vitamins: Secondary | ICD-10-CM | POA: Diagnosis not present

## 2015-03-13 DIAGNOSIS — M810 Age-related osteoporosis without current pathological fracture: Secondary | ICD-10-CM | POA: Diagnosis not present

## 2015-03-13 DIAGNOSIS — I10 Essential (primary) hypertension: Secondary | ICD-10-CM | POA: Diagnosis not present

## 2015-03-13 DIAGNOSIS — M859 Disorder of bone density and structure, unspecified: Secondary | ICD-10-CM | POA: Diagnosis not present

## 2015-03-13 DIAGNOSIS — F0390 Unspecified dementia without behavioral disturbance: Secondary | ICD-10-CM | POA: Diagnosis not present

## 2015-03-13 DIAGNOSIS — Z6824 Body mass index (BMI) 24.0-24.9, adult: Secondary | ICD-10-CM | POA: Diagnosis not present

## 2015-06-20 DIAGNOSIS — Z961 Presence of intraocular lens: Secondary | ICD-10-CM | POA: Diagnosis not present

## 2015-06-20 DIAGNOSIS — H26493 Other secondary cataract, bilateral: Secondary | ICD-10-CM | POA: Diagnosis not present

## 2015-06-20 DIAGNOSIS — H52203 Unspecified astigmatism, bilateral: Secondary | ICD-10-CM | POA: Diagnosis not present

## 2015-08-25 DIAGNOSIS — Z23 Encounter for immunization: Secondary | ICD-10-CM | POA: Diagnosis not present

## 2015-11-07 DIAGNOSIS — E784 Other hyperlipidemia: Secondary | ICD-10-CM | POA: Diagnosis not present

## 2015-11-07 DIAGNOSIS — M810 Age-related osteoporosis without current pathological fracture: Secondary | ICD-10-CM | POA: Diagnosis not present

## 2015-11-07 DIAGNOSIS — I10 Essential (primary) hypertension: Secondary | ICD-10-CM | POA: Diagnosis not present

## 2015-11-07 DIAGNOSIS — E538 Deficiency of other specified B group vitamins: Secondary | ICD-10-CM | POA: Diagnosis not present

## 2015-11-14 DIAGNOSIS — M5030 Other cervical disc degeneration, unspecified cervical region: Secondary | ICD-10-CM | POA: Diagnosis not present

## 2015-11-14 DIAGNOSIS — Z6826 Body mass index (BMI) 26.0-26.9, adult: Secondary | ICD-10-CM | POA: Diagnosis not present

## 2015-11-14 DIAGNOSIS — H9193 Unspecified hearing loss, bilateral: Secondary | ICD-10-CM | POA: Diagnosis not present

## 2015-11-14 DIAGNOSIS — E538 Deficiency of other specified B group vitamins: Secondary | ICD-10-CM | POA: Diagnosis not present

## 2015-11-14 DIAGNOSIS — Q211 Atrial septal defect: Secondary | ICD-10-CM | POA: Diagnosis not present

## 2015-11-14 DIAGNOSIS — M810 Age-related osteoporosis without current pathological fracture: Secondary | ICD-10-CM | POA: Diagnosis not present

## 2015-11-14 DIAGNOSIS — F0390 Unspecified dementia without behavioral disturbance: Secondary | ICD-10-CM | POA: Diagnosis not present

## 2015-11-14 DIAGNOSIS — R808 Other proteinuria: Secondary | ICD-10-CM | POA: Diagnosis not present

## 2015-11-14 DIAGNOSIS — Z0000 Encounter for general adult medical examination without abnormal findings: Secondary | ICD-10-CM | POA: Diagnosis not present

## 2015-11-14 DIAGNOSIS — D6489 Other specified anemias: Secondary | ICD-10-CM | POA: Diagnosis not present

## 2015-11-14 DIAGNOSIS — R202 Paresthesia of skin: Secondary | ICD-10-CM | POA: Diagnosis not present

## 2015-11-14 DIAGNOSIS — I638 Other cerebral infarction: Secondary | ICD-10-CM | POA: Diagnosis not present

## 2016-01-06 ENCOUNTER — Emergency Department (HOSPITAL_COMMUNITY): Payer: Medicare Other

## 2016-01-06 ENCOUNTER — Observation Stay (HOSPITAL_COMMUNITY)
Admission: EM | Admit: 2016-01-06 | Discharge: 2016-01-07 | Disposition: A | Discharge status: Home / Self Care | Payer: Medicare Other | Attending: Internal Medicine | Admitting: Internal Medicine

## 2016-01-06 ENCOUNTER — Encounter (HOSPITAL_COMMUNITY): Payer: Self-pay | Admitting: Emergency Medicine

## 2016-01-06 DIAGNOSIS — F0391 Unspecified dementia with behavioral disturbance: Secondary | ICD-10-CM | POA: Diagnosis not present

## 2016-01-06 DIAGNOSIS — D649 Anemia, unspecified: Secondary | ICD-10-CM | POA: Diagnosis not present

## 2016-01-06 DIAGNOSIS — R404 Transient alteration of awareness: Secondary | ICD-10-CM | POA: Diagnosis not present

## 2016-01-06 DIAGNOSIS — R4182 Altered mental status, unspecified: Secondary | ICD-10-CM

## 2016-01-06 DIAGNOSIS — I10 Essential (primary) hypertension: Secondary | ICD-10-CM | POA: Diagnosis not present

## 2016-01-06 DIAGNOSIS — Q211 Atrial septal defect: Secondary | ICD-10-CM | POA: Insufficient documentation

## 2016-01-06 DIAGNOSIS — Z7982 Long term (current) use of aspirin: Secondary | ICD-10-CM | POA: Diagnosis not present

## 2016-01-06 DIAGNOSIS — I361 Nonrheumatic tricuspid (valve) insufficiency: Secondary | ICD-10-CM | POA: Insufficient documentation

## 2016-01-06 DIAGNOSIS — R61 Generalized hyperhidrosis: Secondary | ICD-10-CM | POA: Diagnosis not present

## 2016-01-06 DIAGNOSIS — F0390 Unspecified dementia without behavioral disturbance: Secondary | ICD-10-CM | POA: Insufficient documentation

## 2016-01-06 DIAGNOSIS — M1711 Unilateral primary osteoarthritis, right knee: Secondary | ICD-10-CM | POA: Insufficient documentation

## 2016-01-06 DIAGNOSIS — Z8673 Personal history of transient ischemic attack (TIA), and cerebral infarction without residual deficits: Secondary | ICD-10-CM | POA: Insufficient documentation

## 2016-01-06 DIAGNOSIS — R55 Syncope and collapse: Principal | ICD-10-CM | POA: Insufficient documentation

## 2016-01-06 DIAGNOSIS — N179 Acute kidney failure, unspecified: Secondary | ICD-10-CM | POA: Diagnosis not present

## 2016-01-06 DIAGNOSIS — R42 Dizziness and giddiness: Secondary | ICD-10-CM | POA: Diagnosis not present

## 2016-01-06 LAB — URINALYSIS, ROUTINE W REFLEX MICROSCOPIC
Bilirubin Urine: NEGATIVE
Glucose, UA: NEGATIVE mg/dL
HGB URINE DIPSTICK: NEGATIVE
Ketones, ur: NEGATIVE mg/dL
Leukocytes, UA: NEGATIVE
Nitrite: NEGATIVE
PROTEIN: NEGATIVE mg/dL
Specific Gravity, Urine: 1.01 (ref 1.005–1.030)
pH: 6 (ref 5.0–8.0)

## 2016-01-06 LAB — CBC
HCT: 34.4 % — ABNORMAL LOW (ref 36.0–46.0)
Hemoglobin: 11 g/dL — ABNORMAL LOW (ref 12.0–15.0)
MCH: 27.2 pg (ref 26.0–34.0)
MCHC: 32 g/dL (ref 30.0–36.0)
MCV: 84.9 fL (ref 78.0–100.0)
PLATELETS: 209 10*3/uL (ref 150–400)
RBC: 4.05 MIL/uL (ref 3.87–5.11)
RDW: 15 % (ref 11.5–15.5)
WBC: 6.8 10*3/uL (ref 4.0–10.5)

## 2016-01-06 LAB — BASIC METABOLIC PANEL
Anion gap: 11 (ref 5–15)
BUN: 16 mg/dL (ref 6–20)
CALCIUM: 8.5 mg/dL — AB (ref 8.9–10.3)
CHLORIDE: 109 mmol/L (ref 101–111)
CO2: 20 mmol/L — ABNORMAL LOW (ref 22–32)
CREATININE: 1.04 mg/dL — AB (ref 0.44–1.00)
GFR calc non Af Amer: 48 mL/min — ABNORMAL LOW (ref >60)
GFR, EST AFRICAN AMERICAN: 55 mL/min — AB (ref >60)
Glucose, Bld: 104 mg/dL — ABNORMAL HIGH (ref 65–99)
Potassium: 4 mmol/L (ref 3.5–5.1)
Sodium: 140 mmol/L (ref 135–145)

## 2016-01-06 LAB — I-STAT TROPONIN, ED: Troponin i, poc: 0 ng/mL (ref 0.00–0.08)

## 2016-01-06 LAB — CBG MONITORING, ED: Glucose-Capillary: 84 mg/dL (ref 65–99)

## 2016-01-06 LAB — TSH: TSH: 3.421 u[IU]/mL (ref 0.350–4.500)

## 2016-01-06 MED ORDER — ATORVASTATIN CALCIUM 40 MG PO TABS
40.0000 mg | ORAL_TABLET | Freq: Every day | ORAL | Status: DC
  Administered 2016-01-07: 40 mg via ORAL
  Filled 2016-01-06: qty 1

## 2016-01-06 MED ORDER — LORAZEPAM 0.5 MG PO TABS
0.5000 mg | ORAL_TABLET | Freq: Two times a day (BID) | ORAL | Status: DC
  Administered 2016-01-06 – 2016-01-07 (×3): 0.5 mg via ORAL
  Filled 2016-01-06 (×3): qty 1

## 2016-01-06 MED ORDER — VITAMIN B-12 1000 MCG PO TABS
1000.0000 ug | ORAL_TABLET | Freq: Every day | ORAL | Status: DC
  Administered 2016-01-07: 1000 ug via ORAL
  Filled 2016-01-06: qty 1

## 2016-01-06 MED ORDER — ASPIRIN 325 MG PO TABS
325.0000 mg | ORAL_TABLET | Freq: Every day | ORAL | Status: DC
  Administered 2016-01-07: 325 mg via ORAL
  Filled 2016-01-06: qty 1

## 2016-01-06 MED ORDER — AMLODIPINE BESYLATE 5 MG PO TABS
5.0000 mg | ORAL_TABLET | Freq: Every day | ORAL | Status: DC
  Administered 2016-01-07: 5 mg via ORAL
  Filled 2016-01-06: qty 1

## 2016-01-06 MED ORDER — ENOXAPARIN SODIUM 40 MG/0.4ML ~~LOC~~ SOLN
40.0000 mg | SUBCUTANEOUS | Status: DC
  Filled 2016-01-06: qty 0.4

## 2016-01-06 MED ORDER — ACETAMINOPHEN 650 MG RE SUPP: 650.0000 mg | Freq: Four times a day (QID) | RECTAL | Status: DC | PRN

## 2016-01-06 MED ORDER — MEMANTINE HCL 10 MG PO TABS
10.0000 mg | ORAL_TABLET | Freq: Two times a day (BID) | ORAL | Status: DC
  Administered 2016-01-06 – 2016-01-07 (×2): 10 mg via ORAL
  Filled 2016-01-06 (×2): qty 1

## 2016-01-06 MED ORDER — QUETIAPINE FUMARATE 25 MG PO TABS
25.0000 mg | ORAL_TABLET | Freq: Every day | ORAL | Status: DC
  Administered 2016-01-06: 25 mg via ORAL
  Filled 2016-01-06: qty 1

## 2016-01-06 MED ORDER — ACETAMINOPHEN 325 MG PO TABS: 650.0000 mg | ORAL_TABLET | Freq: Four times a day (QID) | ORAL | Status: DC | PRN

## 2016-01-06 MED ORDER — SODIUM CHLORIDE 0.9% FLUSH
3.0000 mL | Freq: Two times a day (BID) | INTRAVENOUS | Status: DC
  Administered 2016-01-06: 3 mL via INTRAVENOUS

## 2016-01-06 NOTE — H&P (Addendum)
[facility] History and Physical                                                                                    Cathy Johnson, is a 82 y.o. female  MRN: [id number]   DOB - 1927-11-18  Admit Date - 01/06/2016  Outpatient Primary MD for the patient is Jerlyn Ly, MD  Referring MD: Jeanell Sparrow / ER  PMH: Past Medical History  Diagnosis Date  . Hypertension   . Stroke ([place])   . Arthritis   . Dementia   . PFO (patent foramen ovale)     PFO by bubble study 02/05/11 TEE  . Osteoarthritis of right knee 05/02/2014      PSH: Past Surgical History  Procedure Laterality Date  . Carpal tunnel release    . Breast surgery      LT BREAST MASS REMOVED  . Knee arthroscopy      RIGHT  . Partial knee arthroplasty Right 05/02/2014    Procedure: UNICOMPARTMENTAL KNEE;  Surgeon: Johnny Bridge, MD;  Location: [place];  Service: Orthopedics;  Laterality: Right;     CC:  Chief Complaint  Patient presents with  . 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  She has severe dementia and history is not reliable.The patient was in her usual state of health when she awakened this morning. Later in the morning the husband found the patient slumped over in the chair onto the breakfast table. Patient was barely arousable, pale and diaphoretic. EMS was called to the home. Patient had a thready pulse and was somewhat lethargic so she was given a total of 800 mL normal saline en route to the hospital. Prior to arrival she had a palpable systolic pressure of [id number]. According to the son (who helps with history since both the patient husband and wife have some memory issues) the patient has not started any new medications recently although about 4 months ago she was started on Ativan for agitation. She has not had any constitutional  symptoms, no GI illnesses and no apparent UTI symptoms. Her only complaint that she has is after rising from a seated position she has significant pain in her left knee 2/2 severe OA. No apparent issues with eating, swallowing or poor oral intake  ER Evaluation and treatment: Afebrile-BP 108/82-pulse 83-respiration 17-room air saturations 100% Orthostatic vital signs negative but these were completed after patient received fluid challenges EKG: Sinus rhythm 83 bpm, QTC 478 ms, no ischemic changes noted, no change since previous EKG. PCXR: No acute process CT head without contrast: No acute abnormality, moderate diffuse cerebral and cerebellar atrophy, mild to moderate chronic small vessel white matter ischemic changes in both cerebral hemispheres Lab data: CO2 20, creatinine 1.04, hemoglobin 11 otherwise CBC unremarkable, urinalysis unremarkable except for cloudy appearance Normal saline 800 mL prior to arrival per EMS Normal saline 500 mL per EDP  Review of Systems   Unreliable  Social History Social History  Substance Use Topics  . Smoking status:  Never Smoker   . Smokeless tobacco: Not on file  . Alcohol Use: No    Resides at: Private residence  Lives with: Spouse  Ambulatory status: Without assistive devices   Family History History reviewed. No pertinent family history. This patient has significant dementia and is unable to recall family medical history   Prior to Admission medications   Medication Sig Start Date End Date Taking? Authorizing Provider  amLODipine (NORVASC) 5 MG tablet Take 5 mg by mouth daily.   Yes Historical Provider, MD  aspirin 325 MG tablet Take 325 mg by mouth daily.   Yes Historical Provider, MD  atorvastatin (LIPITOR) 40 MG tablet Take 40 mg by mouth daily.   Yes Historical Provider, MD  LORazepam (ATIVAN) 0.5 MG tablet Take 0.5 mg by mouth 2 (two) times daily.   Yes Historical Provider, MD  memantine (NAMENDA) 10 MG tablet Take 10 mg by mouth 2  (two) times daily.   Yes Historical Provider, MD  Rivastigmine (EXELON TD) Place 1 patch onto the skin daily.   Yes Historical Provider, MD  telmisartan (MICARDIS) 40 MG tablet Take 40 mg by mouth 2 (two) times daily.   Yes Historical Provider, MD  vitamin B-12 (CYANOCOBALAMIN) 1000 MCG tablet Take 1,000 mcg by mouth daily.   Yes Historical Provider, MD    No Known Allergies  Physical Exam  Vitals  Blood pressure 135/62, pulse 75, temperature 97.4 F (36.3 C), temperature source Oral, resp. rate 18, SpO2 98 %.   General:  In no acute distress, appears healthy, well nourishedAnd somewhat younger than stated age  Psych:  Normal affect, Denies Suicidal or Homicidal ideations, Awake Alert, Oriented X name and place, noted with short term memory deficits  Neuro:   No focal neurological deficits, CN II through XII intact, Strength 5/5 all 4 extremities, Sensation intact all 4 extremities.  ENT:  Ears and Eyes appear Normal, Conjunctivae clear, PER. Moist oral mucosa without erythema or exudates.  Neck:  Supple, No lymphadenopathy appreciated  Respiratory:  Symmetrical chest wall movement, Good air movement bilaterally, CTAB. Room Air  Cardiac:  RRR, No Murmurs, no LE edema noted, no JVD, No carotid bruits, peripheral pulses palpable at 2+  Abdomen:  Positive bowel sounds, Soft, Non tender, Non distended,  No masses appreciated, no obvious hepatosplenomegaly  Skin:  No Cyanosis, Normal Skin Turgor, No Skin Rash or Bruise.  Extremities: Symmetrical without obvious trauma or injury,  no effusions.  Data Review  CBC  Recent Labs Lab 01/06/16 0910  WBC 6.8  HGB 11.0*  HCT 34.4*  PLT 209  MCV 84.9  MCH 27.2  MCHC 32.0  RDW 15.0    Chemistries   Recent Labs Lab 01/06/16 0910  NA 140  K 4.0  CL 109  CO2 20*  GLUCOSE 104*  BUN 16  CREATININE 1.04*  CALCIUM 8.5*    CrCl cannot be calculated (Unknown ideal weight.).  No results for input(s): TSH, T4TOTAL, T3FREE,  THYROIDAB in the last 72 hours.  Invalid input(s): FREET3  Coagulation profile No results for input(s): INR, PROTIME in the last 168 hours.  No results for input(s): DDIMER in the last 72 hours.  Cardiac Enzymes No results for input(s): CKMB, TROPONINI, MYOGLOBIN in the last 168 hours.  Invalid input(s): CK  Invalid input(s): POCBNP  Urinalysis    Component Value Date/Time   COLORURINE YELLOW 01/06/2016 1045   APPEARANCEUR CLOUDY* 01/06/2016 1045   LABSPEC 1.010 01/06/2016 1045   PHURINE 6.0 01/06/2016 1045  GLUCOSEU NEGATIVE 01/06/2016 [place] 01/06/2016 [place] 01/06/2016 [place] 01/06/2016 1045   PROTEINUR NEGATIVE 01/06/2016 1045   UROBILINOGEN 0.2 02/01/2011 1932   NITRITE NEGATIVE 01/06/2016 1045   LEUKOCYTESUR NEGATIVE 01/06/2016 1045    Imaging results:   Ct Head Wo Contrast  01/06/2016  CLINICAL DATA:  Syncopal episode while at the breakfast stable this morning. Diaphoresis. EXAM: CT HEAD WITHOUT CONTRAST TECHNIQUE: Contiguous axial images were obtained from the base of the skull through the vertex without intravenous contrast. COMPARISON:  02/01/2011. FINDINGS: Diffusely enlarged ventricles and subarachnoid spaces. Patchy white matter low density in both cerebral hemispheres. No skull fracture, intracranial hemorrhage, mass lesion, CT evidence of acute infarction or paranasal sinus air-fluid levels. IMPRESSION: 1. No acute abnormality. 2. Mild to moderate diffuse cerebral and cerebellar atrophy. 3. Mild to moderate chronic small vessel white matter ischemic changes in both cerebral hemispheres. Electronically Signed   By: Claudie Revering M.D.   On: 01/06/2016 10:54   Dg Chest Portable 1 View  01/06/2016  CLINICAL DATA:  Syncopal episode this am while eating breakfast at home; No known heart problems EXAM: PORTABLE CHEST 1 VIEW COMPARISON:  04/25/2014 FINDINGS: The heart size and mediastinal contours are within normal  limits. Both lungs are clear. The visualized skeletal structures are unremarkable. IMPRESSION: No active disease. Electronically Signed   By: Nolon Nations M.D.   On: 01/06/2016 11:46     EKG: (Independently reviewed)  Sinus rhythm 83 bpm, QTC 478 ms, no ischemic changes noted, no change since previous EKG.   Assessment & Plan  Principal Problem:   Syncope -  Orthostatic?  may be related to patient's Exelon patch? - orthostatic vital normal but done after IVF given by EMS -Admit to telemetry/Obs -Monitor for arrhythmias especially brady arrhythmias (Exelon) -Echocardiogram; has known PFO diagnosed in 2012 per TEE but no peripheral edema and no hypoxia so doubt PE as etiology to syncopal event -Mobilize with assistance -PT/OT evaluation -Patient has significant dementia so history difficult to obtain but currently not having chest pain  Active Problems:   AKI  -Very mild but also labs were obtained after patient received 1300 mL of fluid -Baseline creatinine 0.76 with a GFR of 75 and current creatinine 1.04 with a GFR of 48 -Hold ARB -Labs in a.m.    Dementia -Exelon on hold as above -Continue Namenda and Ativan -Son reports continued but improved issues with agitation    HTN  -Moderate control -Continue Norvasc -ARB on hold as above    Anemia -Baseline hgb ~ 11 currently is stable -Check TSH    DVT Prophylaxis: Lovenox  Family Communication:   Son at bedside who assists with management of patient including her medication-does not live with patient and husband  Code Status:  Full code  Condition:  Stable  Discharge disposition: To suspect discharge back to previous home environment once syncopal workup is completed  Time spent in minutes : 60      ELLIS,ALLISON L. ANP on 01/06/2016 at 12:26 PM  You may contact me by going to [email or web address] - password [email or web address]  I am available from 7a-7p but please confirm I am on the schedule by going to Amion as above.    After 7p please contact night coverage person covering me after hours  [facility]  I have examined the patient, reviewed the chart and modified the above note which I agree with. Husband states she has not been  eating or drinking less than usual. Syncope work up. Not certain if she was orthostatic as orthostatic vitals obtained after IVF given. UA shows normal specific gravity. Hold Exelon.  No neurological deficits noted. New AKI noted. Hold Losartan for now. PT eval. Hopefully will be able to d/c home tomorrow with close follow up with PCP.   Kassadi Presswood,MD Pager # [phone] [email or web address] 01/06/2016, 1:00 PM  Addendum: quite restless per RN- will order safety sitter and Seroquel at bedtime.
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
## 2016-01-06 NOTE — Progress Notes (Signed)
Pt remains confused  Requiring constant monitoring. Safety sitter ordered but not available

## 2016-01-06 NOTE — ED Provider Notes (Signed)
CSN: [id number]     Arrival date & time 01/06/16  [id number] History   First MD Initiated Contact with Patient 01/06/16 [phone]     Chief Complaint  Patient presents with  . Loss of Consciousness   Level 5 caveat patient with dementia, history obtained from husband, son, nurse's notes, ems  (Consider location/radiation/quality/duration/timing/severity/associated sxs/prior Treatment) HPI  82 y.o. Female with dementia presents today with episode of syncope. Husband states he was not with patient but found her lsumped over at kitchen table. Husband was unable to wake here.  He states she was wet from head to toe.   She has no memory of event, but is currently reported by family to be back to baseline.  EMS called. Patient reportedly not responive to first responders, but responsive after 500 cc fluid bolus by ems.   Patient without complaints now.    Past Medical History  Diagnosis Date  . Hypertension   . Stroke ([place])   . Arthritis   . Dementia   . PFO (patent foramen ovale)     PFO by bubble study 02/05/11 TEE  . Osteoarthritis of right knee 05/02/2014   Past Surgical History  Procedure Laterality Date  . Carpal tunnel release    . Breast surgery      LT BREAST MASS REMOVED  . Knee arthroscopy      RIGHT  . Partial knee arthroplasty Right 05/02/2014    Procedure: UNICOMPARTMENTAL KNEE;  Surgeon: Johnny Bridge, MD;  Location: [facility];  Service: Orthopedics;  Laterality: Right;   History reviewed. No pertinent family history. Social History  Substance Use Topics  . Smoking status: Never Smoker   . Smokeless tobacco: None  . Alcohol Use: No   OB History    No data available     Review of Systems  Unable to perform ROS: Dementia      Allergies  Review of patient's allergies indicates no known allergies.  Home Medications   Prior to Admission medications   Medication Sig Start Date End Date Taking? Authorizing Provider  amLODipine (NORVASC) 5 MG tablet Take 5 mg by mouth  daily.    Historical Provider, MD  atorvastatin (LIPITOR) 40 MG tablet Take 40 mg by mouth daily.    Historical Provider, MD  baclofen (LIORESAL) 10 MG tablet Take 1 tablet (10 mg total) by mouth 3 (three) times daily. As needed for muscle spasm 05/02/14   Marchia Bond, MD  HYDROcodone-acetaminophen [facility]) 10-325 MG per tablet Take 1-2 tablets by mouth every 6 (six) hours as needed. 05/02/14   Marchia Bond, MD  memantine (NAMENDA) 10 MG tablet Take 10 mg by mouth 2 (two) times daily.    Historical Provider, MD  Risedronate Sodium (ATELVIA) 35 MG TBEC Take 35 mg by mouth once a week. Every saturday    Historical Provider, MD  rivaroxaban (XARELTO) 10 MG TABS tablet Take 1 tablet (10 mg total) by mouth daily. 05/02/14   Marchia Bond, MD  Rivastigmine (EXELON TD) Place 1 patch onto the skin daily.    Historical Provider, MD  sennosides-docusate sodium (SENOKOT-S) 8.6-50 MG tablet Take 2 tablets by mouth daily. 05/02/14   Marchia Bond, MD  telmisartan (MICARDIS) 40 MG tablet Take 40 mg by mouth 2 (two) times daily.    Historical Provider, MD  Vitamin D, Ergocalciferol, (DRISDOL) 50000 UNITS CAPS capsule Take 50,000 Units by mouth every 7 (seven) days. Every Tuesday    Historical Provider, MD   BP 108/82 mmHg  Pulse  84  Temp(Src) 97.4 F (36.3 C) (Oral)  Resp 17  SpO2 100% Physical Exam  Constitutional: She appears well-developed and well-nourished. No distress.  HENT:  Head: Normocephalic and atraumatic.  Right Ear: External ear normal.  Left Ear: External ear normal.  Mouth/Throat: Oropharynx is clear and moist.  Eyes: Pupils are equal, round, and reactive to light.  Neck: Normal range of motion.  Cardiovascular: Normal rate and regular rhythm.   Pulmonary/Chest: Effort normal and breath sounds normal.  Abdominal: Soft. Bowel sounds are normal.  Musculoskeletal: Normal range of motion.  Neurological: She is alert. She displays normal reflexes. No cranial nerve deficit. She exhibits  normal muscle tone. Coordination normal.  Oriented to person and place but not time  Skin: Skin is warm and dry.  Psychiatric: She has a normal mood and affect. Her behavior is normal.  Nursing note and vitals reviewed.   ED Course  Procedures (including critical care time) Labs Review Labs Reviewed  BASIC METABOLIC PANEL - Abnormal; Notable for the following:    CO2 20 (*)    Glucose, Bld 104 (*)    Creatinine, Ser 1.04 (*)    Calcium 8.5 (*)    GFR calc non Af Amer 48 (*)    GFR calc Af Amer 55 (*)    All other components within normal limits  CBC - Abnormal; Notable for the following:    Hemoglobin 11.0 (*)    HCT 34.4 (*)    All other components within normal limits  URINALYSIS, ROUTINE W REFLEX MICROSCOPIC (NOT AT [facility]) - Abnormal; Notable for the following:    APPearance CLOUDY (*)    All other components within normal limits  CBG MONITORING, ED  I-STAT TROPOININ, ED    Imaging Review Ct Head Wo Contrast  01/06/2016  CLINICAL DATA:  Syncopal episode while at the breakfast stable this morning. Diaphoresis. EXAM: CT HEAD WITHOUT CONTRAST TECHNIQUE: Contiguous axial images were obtained from the base of the skull through the vertex without intravenous contrast. COMPARISON:  02/01/2011. FINDINGS: Diffusely enlarged ventricles and subarachnoid spaces. Patchy white matter low density in both cerebral hemispheres. No skull fracture, intracranial hemorrhage, mass lesion, CT evidence of acute infarction or paranasal sinus air-fluid levels. IMPRESSION: 1. No acute abnormality. 2. Mild to moderate diffuse cerebral and cerebellar atrophy. 3. Mild to moderate chronic small vessel white matter ischemic changes in both cerebral hemispheres. Electronically Signed   By: Claudie Revering M.D.   On: 01/06/2016 10:54   I have personally reviewed and evaluated these images and lab results as part of my medical decision-making.   EKG Interpretation   Date/Time:  Sunday January 06 2016 08:39:54  EDT Ventricular Rate:  83 PR Interval:  142 QRS Duration: 105 QT Interval:  407 QTC Calculation: 478 R Axis:   20 Text Interpretation:  Sinus rhythm No significant change since last  tracing Confirmed by Lazar Tierce MD, Andee Poles [id number]) on 01/06/2016 10:32:15 AM      MDM   Final diagnoses:  Altered mental status, unspecified altered mental status type    Reason 82 year old female with some dementia who had an episode of unresponsiveness today. Her that the patient and her husband are both poor historians, it has been difficult to qualify exactly what occurred. There is no report that she had any seizure activity or trauma. Workup here has been essentially normal although she has some mild anemia. Given her age it is certainly concerning that she had a cardiac arrhythmia. However, EMS reports no evidence  of arrhythmia on their arrival and she had a spontaneous return to baseline. Plan admission for further evaluations, specifically cardiac monitoring. Discussed the patient's care with Erin Hearing on call for hospitalist.    Pattricia Boss, MD 01/06/16 [phone]

## 2016-01-06 NOTE — Progress Notes (Signed)
Pt admitted to room 17. Safety camera on. Husband and son made aware. Pt extremely fogetful. Needs constant reorientation. Bed alarm on

## 2016-01-07 ENCOUNTER — Observation Stay (HOSPITAL_BASED_OUTPATIENT_CLINIC_OR_DEPARTMENT_OTHER): Payer: Medicare Other

## 2016-01-07 DIAGNOSIS — R55 Syncope and collapse: Principal | ICD-10-CM

## 2016-01-07 DIAGNOSIS — I10 Essential (primary) hypertension: Secondary | ICD-10-CM | POA: Diagnosis not present

## 2016-01-07 DIAGNOSIS — N179 Acute kidney failure, unspecified: Secondary | ICD-10-CM | POA: Diagnosis not present

## 2016-01-07 DIAGNOSIS — F0390 Unspecified dementia without behavioral disturbance: Secondary | ICD-10-CM | POA: Diagnosis not present

## 2016-01-07 LAB — BASIC METABOLIC PANEL
Anion gap: 9 (ref 5–15)
BUN: 14 mg/dL (ref 6–20)
CALCIUM: 9.2 mg/dL (ref 8.9–10.3)
CO2: 24 mmol/L (ref 22–32)
CREATININE: 0.99 mg/dL (ref 0.44–1.00)
Chloride: 107 mmol/L (ref 101–111)
GFR calc Af Amer: 59 mL/min — ABNORMAL LOW (ref >60)
GFR, EST NON AFRICAN AMERICAN: 51 mL/min — AB (ref >60)
GLUCOSE: 84 mg/dL (ref 65–99)
Potassium: 4.2 mmol/L (ref 3.5–5.1)
Sodium: 140 mmol/L (ref 135–145)

## 2016-01-07 LAB — ECHOCARDIOGRAM COMPLETE
HEIGHTINCHES: 63 in
Weight: 2113.6 oz

## 2016-01-07 NOTE — Care Management Obs Status (Signed)
[place] NOTIFICATION   Patient Details  Name: CARIANA PANZER MRN: [id number] Date of Birth: Dec 19, 1927   Medicare Observation Status Notification Given:  Yes    Royston Bake, RN 01/07/2016, 12:26 PM

## 2016-01-07 NOTE — Care Management Note (Signed)
Case Management Note  Patient Details  Name: Cathy Johnson MRN: [id number] Date of Birth: [birth year]  Subjective/Objective:     Admitted with Syncope               Action/Plan: CM talked to spouse and son Gwyndolyn Saxon at bedside about DCP, pt asleep. Gwyndolyn Saxon stated that the pt and spouse have been married for 64 yrs and cannot be separated. He also stated that his father is [facility] but is independent and takes care of the patient. Gwyndolyn Saxon usually will see the patient daily, prepare her medication and other needs. He also stated that his fathers mind is good and he does not have any dementia. DCP is to return home when stable with HHC. HHC choice offered, pt/ son requested [facility]. Gwyndolyn Saxon stated that she does not need any equipment at this time, stated " she can out run my dad at home." Attending MD at discharge please enter the face to face document in  [facility] for [facility] services at discharge.   Expected Discharge Date:    possibly 01/08/2016              Expected Discharge Plan:  [place]  Discharge planning Services  CM Consult :    Choice offered to:  Adult Children, Spouse   HH Arranged:  RN, PT HH Agency:  [facility]  Status of Service:  In process, will continue to follow  Royston Bake, RN 01/07/2016, 11:03 AM

## 2016-01-07 NOTE — Progress Notes (Signed)
Echocardiogram 2D Echocardiogram has been performed.  Joelene Millin 01/07/2016, 11:37 AM

## 2016-01-07 NOTE — Discharge Summary (Signed)
Discharge Summary  Cathy Johnson [id number] DOB: [birth year]  PCP: Jerlyn Ly, MD  Admit date: 01/06/2016 Discharge date: 01/07/2016  Time spent: 25 minutes   Recommendations for Outpatient Follow-up:  1. Patient will follow-up with home health PT and RN 2. Patient will follow up with her PCP in the next few weeks, husband will make appointment as per their insistence-at that time, pending echocardiogram can be followed up on 3. Medication change: Exelon patch discontinued   Discharge Diagnoses:  Active Hospital Problems   Diagnosis Date Noted  . Syncope 01/06/2016  . AKI (acute kidney injury) ([place]) 01/06/2016  . Dementia 01/06/2016  . HTN (hypertension) 01/06/2016  . Anemia 01/06/2016    Resolved Hospital Problems   Diagnosis Date Noted Date Resolved  No resolved problems to display.    Discharge Condition: Improved, being discharged home   Diet recommendation: Low-sodium   Filed Vitals:   01/07/16 0539 01/07/16 1201  BP: 120/56   Pulse: 83 85  Temp: 98.4 F (36.9 C) 98.6 F (37 C)  Resp: 18 18    History of present illness:  Patient is an 82 year old female past medical history of hypertension and dementia who is cared for by her husband as well as son who lives nearby and checks on her almost daily who presented to the emergency room and 3/19 after being found slumped over and briefly unconscious. Patient able to easily be awoken and en route from home to the emergency room by ambulance, paramedics gave patient an IV fluid bolus. In the emergency room, patient found to have slightly elevated BUN and creatinine consistent with dehydration. In review of the patient's medications and recent changes, and Patch have been started in the past few months.   Hospital Course:  Principal Problem:   Syncope: Felt to be secondary to orthostatic hypotension brought on by decreased by mouth intake and possibly Exelon patch. Following fluids, patient doing much better.  Vital signs stable with no orthostatic hypotension by 3/20. Discussed with family and they would like to put the Exelon patch on hold child agree with. They will follow-up with PCP in the next few weeks.:  Active Problems:   AKI (acute kidney injury) ([place]): Minimally elevated on admission, gently hydrating, and resolved by 3/20   Dementia: Stable, continue home medications, except for Exelon patch   HTN (hypertension): Resume home medications on discharge   Anemia   Procedures:  Echocardiogram, results pending. PCP will follow-up   Consultations:  None   Discharge Exam: BP 120/56 mmHg  Pulse 85  Temp(Src) 98.6 F (37 C) (Oral)  Resp 18  Ht 5\' 3"  (1.6 m)  Wt 59.92 kg (132 lb 1.6 oz)  BMI 23.41 kg/m2  SpO2 100%  General: Alert and oriented 2, no acute distress  Cardiovascular: Regular rate and rhythm, S1-S2  Respiratory: Clear to auscultation bilaterally   Discharge Instructions You were cared for by a hospitalist during your hospital stay. If you have any questions about your discharge medications or the care you received while you were in the hospital after you are discharged, you can call the unit and asked to speak with the hospitalist on call if the hospitalist that took care of you is not available. Once you are discharged, your primary care physician will handle any further medical issues. Please note that NO REFILLS for any discharge medications will be authorized once you are discharged, as it is imperative that you return to your primary care physician (or establish a relationship with  a primary care physician if you do not have one) for your aftercare needs so that they can reassess your need for medications and monitor your lab values.     Medication List    STOP taking these medications        EXELON TD      TAKE these medications        amLODipine 5 MG tablet  Commonly known as:  NORVASC  Take 5 mg by mouth daily.     aspirin 325 MG tablet  Take 325 mg  by mouth daily.     atorvastatin 40 MG tablet  Commonly known as:  LIPITOR  Take 40 mg by mouth daily.     LORazepam 0.5 MG tablet  Commonly known as:  ATIVAN  Take 0.5 mg by mouth 2 (two) times daily.     memantine 10 MG tablet  Commonly known as:  NAMENDA  Take 10 mg by mouth 2 (two) times daily.     telmisartan 40 MG tablet  Commonly known as:  MICARDIS  Take 40 mg by mouth 2 (two) times daily.     vitamin B-12 1000 MCG tablet  Commonly known as:  CYANOCOBALAMIN  Take 1,000 mcg by mouth daily.       No Known Allergies     Follow-up Information    Follow up with [facility].   Specialty:  Home Health Services   Why:  They will do your home health care at discharge at your home   Contact information:   [place] [id number] [phone]       Follow up with Jerlyn Ly, MD.   Specialty:  Internal Medicine   Why:  Left Message for office to call   Contact information:   [place] [address] [phone]        The results of significant diagnostics from this hospitalization (including imaging, microbiology, ancillary and laboratory) are listed below for reference.    Significant Diagnostic Studies: Ct Head Wo Contrast  01/06/2016  CLINICAL DATA:  Syncopal episode while at the breakfast stable this morning. Diaphoresis. EXAM: CT HEAD WITHOUT CONTRAST TECHNIQUE: Contiguous axial images were obtained from the base of the skull through the vertex without intravenous contrast. COMPARISON:  02/01/2011. FINDINGS: Diffusely enlarged ventricles and subarachnoid spaces. Patchy white matter low density in both cerebral hemispheres. No skull fracture, intracranial hemorrhage, mass lesion, CT evidence of acute infarction or paranasal sinus air-fluid levels. IMPRESSION: 1. No acute abnormality. 2. Mild to moderate diffuse cerebral and cerebellar atrophy. 3. Mild to moderate chronic small vessel white matter ischemic changes in both  cerebral hemispheres. Electronically Signed   By: Claudie Revering M.D.   On: 01/06/2016 10:54   Dg Chest Portable 1 View  01/06/2016  CLINICAL DATA:  Syncopal episode this am while eating breakfast at home; No known heart problems EXAM: PORTABLE CHEST 1 VIEW COMPARISON:  04/25/2014 FINDINGS: The heart size and mediastinal contours are within normal limits. Both lungs are clear. The visualized skeletal structures are unremarkable. IMPRESSION: No active disease. Electronically Signed   By: Nolon Nations M.D.   On: 01/06/2016 11:46    Microbiology: No results found for this or any previous visit (from the past 240 hour(s)).   Labs: Basic Metabolic Panel:  Recent Labs Lab 01/06/16 0910 01/07/16 0541  NA 140 140  K 4.0 4.2  CL 109 107  CO2 20* 24  GLUCOSE 104* 84  BUN 16 14  CREATININE  1.04* 0.99  CALCIUM 8.5* 9.2   Liver Function Tests: No results for input(s): AST, ALT, ALKPHOS, BILITOT, PROT, ALBUMIN in the last 168 hours. No results for input(s): LIPASE, AMYLASE in the last 168 hours. No results for input(s): AMMONIA in the last 168 hours. CBC:  Recent Labs Lab 01/06/16 0910  WBC 6.8  HGB 11.0*  HCT 34.4*  MCV 84.9  PLT 209   Cardiac Enzymes: No results for input(s): CKTOTAL, CKMB, CKMBINDEX, TROPONINI in the last 168 hours. BNP: BNP (last 3 results) No results for input(s): BNP in the last 8760 hours.  ProBNP (last 3 results) No results for input(s): PROBNP in the last 8760 hours.  CBG:  Recent Labs Lab 01/06/16 0952  GLUCAP 84       Signed:  Jorge Retz K  [facility] 01/07/2016, 2:05 PM

## 2016-01-07 NOTE — Progress Notes (Signed)
Discharge instructions to include Medications and follow up appointments reviewed with the patient and her family. All voice understanding to teaching.  [place] via wheelchair.  Home via [place] with her son driving.

## 2016-01-07 NOTE — Evaluation (Signed)
Occupational Therapy Evaluation Patient Details Name: Cathy Johnson MRN: [id number] DOB: 03/24/1928 Today's Date: 01/07/2016    History of Present Illness 82 y/o female admitted w/ syncope. PMH includes: Dementia/confusion, R uni-knee replacement, HTN, Stroke.    Clinical Impression   Pt admitted as above, pleasantly confused presenting with deficits in her ability to perform ADL's and functional transfers safely (see OT problem list below). Currently Min A overall secondary to dementia/confusion and most likely close to baseline level, however, no family present during assessment today. Will follow. Recommend SNF unless family is able to provide 24/7 min assist PRN for safety.    Follow Up Recommendations  SNF;Supervision/Assistance - 24 hour;Other (comment) (Recommend SNF unless family is able to provide 24/7 Min assist)    Equipment Recommendations  Other (comment) (Cont to assess in functional context)    Recommendations for Other Services       Precautions / Restrictions Precautions Precautions: Fall      Mobility Bed Mobility               General bed mobility comments: received EOB as chair alarm going off, assumed got to EOB without assist  Transfers Overall transfer level: Needs assistance Equipment used: 1 person hand held assist Transfers: Sit to/from Stand Sit to Stand: Min assist         General transfer comment: Used RW during transfer to toileting and standing at sink, she required consistent vc's for safety, sequencing and hand placement as she does not appear to usually use an  AD ?secondary to confusion/dementia.     Balance Overall balance assessment: Needs assistance         Standing balance support: During functional activity Standing balance-Leahy Scale: Poor Standing balance comment: min assist required during static standing at sink, LE buckling at times                             ADL Overall ADL's : Needs  assistance/impaired Eating/Feeding: Set up;Sitting   Grooming: Wash/dry hands;Wash/dry face;Oral care;Brushing hair;Set up;Sitting;Standing;Minimal assistance;Cueing for sequencing Grooming Details (indicate cue type and reason): Min assist standing for washing hands at sink; set-up for other grooming tasks seated. Upper Body Bathing: Sitting;Set up;Cueing for sequencing;Min guard   Lower Body Bathing: Sit to/from stand;Set up;Minimal assistance;Cueing for sequencing;Cueing for safety   Upper Body Dressing : Set up;Sitting;Supervision/safety   Lower Body Dressing: Sit to/from stand;Minimal assistance;Supervision/safety Lower Body Dressing Details (indicate cue type and reason): Donned socks w/ Min A sitting and min assist sit to stand Toilet Transfer: Minimal assistance;Ambulation;RW;Cueing for safety;Cueing for sequencing Toilet Transfer Details (indicate cue type and reason): VC's for safety with RW and hand placement Toileting- Clothing Manipulation and Hygiene: Supervision/safety;Min guard;Sit to/from stand;Cueing for safety       Functional mobility during ADLs: Minimal assistance;Rolling walker General ADL Comments: Pt is pleasant 82 y/o female with h/o dementia. No family present during assessment but per chart review pt lives w/ husband whom is also confused. Pt participated in ADL retraining session with focus on transfers, toileting and grooming today. She currently demonstrates deficits in her ability to perform ADL's and functional transfers safely secondary to confusion and knees buckling at times during functional mobility and static standing at sink. Pt indicated that this was due to previous uni-knee replacement on left, however she was noted to frequently lean and knee buckle on right  requiring Min guard assist and pt shifting weight.  Pt indicates that  left knee is worse than right. Pt should benefit from acute OT to address deficits and assist in maximizing independence with  ADL's and decreased burden of care.     Vision     Perception     Praxis      Pertinent Vitals/Pain Pain Assessment: No/denies pain     Hand Dominance Right   Extremity/Trunk Assessment Upper Extremity Assessment Upper Extremity Assessment: Overall WFL for tasks assessed   Lower Extremity Assessment Lower Extremity Assessment: Overall WFL for tasks assessed (+ arthritic changes noted )       Communication Communication Communication: Other (comment);Expressive difficulties (Dementia; word finding difficulties noted)   Cognition Arousal/Alertness: Awake/alert Behavior During Therapy: WFL for tasks assessed/performed Overall Cognitive Status: Impaired/Different from baseline Area of Impairment: Orientation;Memory;Following commands;Safety/judgement;Awareness;Problem solving       Following Commands: Follows one step commands consistently           General Comments       Exercises       Shoulder Instructions      Home Living Family/patient expects to be discharged to:: Private residence Living Arrangements: Spouse/significant other;Children Available Help at Discharge: Family Type of Home: House                           Additional Comments: No family present during OT/PT assessment today, pt confused and h/o severe dementia per chart review.      Prior Functioning/Environment               OT Diagnosis: Cognitive deficits;Generalized weakness   OT Problem List: Impaired balance (sitting and/or standing);Decreased knowledge of precautions;Decreased knowledge of use of DME or AE;Decreased safety awareness;Decreased cognition;Cardiopulmonary status limiting activity   OT Treatment/Interventions: Self-care/ADL training;DME and/or AE instruction;Patient/family education;Cognitive remediation/compensation;Therapeutic activities;Balance training    OT Goals(Current goals can be found in the care plan section) Acute Rehab OT Goals Patient  Stated Goal: Pt unable to state secondary to dementia/confusion OT Goal Formulation: Patient unable to participate in goal setting Time For Goal Achievement: 01/21/16 Potential to Achieve Goals: Fair ADL Goals Pt Will Perform Grooming: with set-up;sitting Pt Will Perform Lower Body Bathing: with min guard assist;sit to/from stand;with supervision Pt Will Perform Lower Body Dressing: with min guard assist;sit to/from stand;with supervision Pt Will Transfer to Toilet: with supervision;ambulating;regular height toilet Pt Will Perform Toileting - Clothing Manipulation and hygiene: with supervision;sitting/lateral leans;sit to/from stand Pt Will Perform Tub/Shower Transfer: with min assist;ambulating;grab bars;rolling walker;shower seat;tub bench  OT Frequency: Min 2X/week   Barriers to D/C: Other (comment)  Lives with spouse. No family was present during assessment however chart review indicates that husband may be forgetful or confused at times as well.       Co-evaluation              End of Session Equipment Utilized During Treatment: Gait belt;Rolling walker Nurse Communication: Mobility status;Other (comment) (Min A for safety and confusion. Breakfast tray set-up)  Activity Tolerance: Patient tolerated treatment well Patient left: in chair;with call bell/phone within reach;with chair alarm set   Time: [phone] OT Time Calculation (min): 25 min Charges:  OT General Charges $OT Visit: 1 Procedure OT Evaluation $OT Eval Moderate Complexity: 1 Procedure OT Treatments $Self Care/Home Management : 8-22 mins G-Codes: OT G-codes **NOT FOR INPATIENT CLASS** Functional Assessment Tool Used: Clinical judgement Functional Limitation: Self care Self Care Current Status [id number]): At least 40 percent but less than 60 percent impaired, limited or restricted Self  Care Goal Status [phone]): At least 20 percent but less than 40 percent impaired, limited or restricted  Almyra Deforest, OTR/L 01/07/2016, 8:42 AM

## 2016-01-07 NOTE — Discharge Instructions (Signed)

## 2016-01-07 NOTE — Evaluation (Signed)
Physical Therapy Evaluation Patient Details Name: Cathy Johnson MRN: [id number] DOB: 20-Aug-1928 Today's Date: 01/07/2016   History of Present Illness  82 y/o female admitted w/ syncope. PMH includes: Dementia/confusion, R uni-knee replacement, HTN, Stroke.   Clinical Impression  Patient demonstrates deficits in functional mobility as indicated below. Will need continued skilled PT to address deficits and maximize function. Will see as indicated and progress as tolerated. At this time, patient remains high risk for falls and requires hands on minimal assist. If family can provide safe hands on minimal assist 24/7 may consider HHPT, otherwise, recommend ST SNF upon acute discharge.    Follow Up Recommendations SNF;Supervision/Assistance - 24 hour (vs home with adequate family assist 24/7)    Equipment Recommendations  None recommended by PT    Recommendations for Other Services       Precautions / Restrictions Precautions Precautions: Fall      Mobility  Bed Mobility               General bed mobility comments: received EOB as chair alarm going off, assumed got to EOB without assist  Transfers Overall transfer level: Needs assistance Equipment used: 1 person hand held assist Transfers: Sit to/from Stand Sit to Stand: Min assist         General transfer comment: Used RW during transfer to toileting and standing at sink, she required consistent vc's for safety, sequencing and hand placement as she does not appear to usually use an  AD ?secondary to confusion/dementia.   Ambulation/Gait Ambulation/Gait assistance: Min assist Ambulation Distance (Feet): 30 Feet Assistive device: Rolling walker (2 wheeled) Gait Pattern/deviations: Step-through pattern;Decreased stride length;Shuffle;Narrow base of support Gait velocity: decreased Gait velocity interpretation: Below normal speed for age/gender General Gait Details: Le buckling with walking at times, use of Rw for  support but continued to required assist for stability. Poor receptivity to cues during ambulation. Increased fall risk  Stairs            Wheelchair Mobility    Modified Rankin (Stroke Patients Only)       Balance Overall balance assessment: Needs assistance Sitting-balance support: Feet supported Sitting balance-Leahy Scale: Fair Sitting balance - Comments: tolerated sitting EOB without assist, decreased truncal response time   Standing balance support: During functional activity Standing balance-Leahy Scale: Poor Standing balance comment: min assist required during static standing at sink, LE buckling at times                              Pertinent Vitals/Pain Pain Assessment: No/denies pain    Home Living Family/patient expects to be discharged to:: Private residence Living Arrangements: Spouse/significant other;Children Available Help at Discharge: Family Type of Home: House           Additional Comments: No family present during OT/PT assessment today, pt confused and h/o severe dementia per chart review.    Prior Function           Comments: patient poor historian, unknown PLOF     Hand Dominance   Dominant Hand: Right    Extremity/Trunk Assessment   Upper Extremity Assessment: Overall WFL for tasks assessed           Lower Extremity Assessment: Overall WFL for tasks assessed (+ arthritic changes noted )         Communication   Communication: Other (comment);Expressive difficulties (Dementia; word finding difficulties noted)  Cognition Arousal/Alertness: Awake/alert Behavior During Therapy: WFL for tasks  assessed/performed Overall Cognitive Status: Impaired/Different from baseline Area of Impairment: Orientation;Memory;Following commands;Safety/judgement;Awareness;Problem solving       Following Commands: Follows one step commands consistently            General Comments      Exercises         Assessment/Plan    PT Assessment Patient needs continued PT services  PT Diagnosis Difficulty walking;Abnormality of gait;Altered mental status   PT Problem List Decreased strength;Decreased activity tolerance;Decreased balance;Decreased mobility;Decreased coordination;Decreased cognition;Decreased safety awareness  PT Treatment Interventions DME instruction;Gait training;Stair training;Therapeutic activities;Functional mobility training;Therapeutic exercise;Balance training;Patient/family education;Cognitive remediation   PT Goals (Current goals can be found in the Care Plan section) Acute Rehab PT Goals Patient Stated Goal: Pt unable to state secondary to dementia/confusion PT Goal Formulation: Patient unable to participate in goal setting Time For Goal Achievement: 01/21/16 Potential to Achieve Goals: Good    Frequency Min 3X/week   Barriers to discharge        Co-evaluation               End of Session Equipment Utilized During Treatment: Gait belt Activity Tolerance: No increased pain Patient left: in chair;with call bell/phone within reach;with chair alarm set Nurse Communication: Mobility status    Functional Assessment Tool Used: clinical judgement Functional Limitation: Mobility: Walking and moving around Mobility: Walking and Moving Around Current Status [phone]): At least 20 percent but less than 40 percent impaired, limited or restricted Mobility: Walking and Moving Around Goal Status [phone]): At least 1 percent but less than 20 percent impaired, limited or restricted    Time: 0755-0820 PT Time Calculation (min) (ACUTE ONLY): 25 min   Charges:   PT Evaluation $PT Eval Moderate Complexity: 1 Procedure     PT G Codes:   PT G-Codes **NOT FOR INPATIENT CLASS** Functional Assessment Tool Used: clinical judgement Functional Limitation: Mobility: Walking and moving around Mobility: Walking and Moving Around Current Status [phone]): At least 20 percent but  less than 40 percent impaired, limited or restricted Mobility: Walking and Moving Around Goal Status [phone]): At least 1 percent but less than 20 percent impaired, limited or restricted    Duncan Dull 01/07/2016, 10:17 AM Alben Deeds, PT DPT  [phone]

## 2016-01-08 DIAGNOSIS — F0390 Unspecified dementia without behavioral disturbance: Secondary | ICD-10-CM | POA: Diagnosis not present

## 2016-01-08 DIAGNOSIS — I10 Essential (primary) hypertension: Secondary | ICD-10-CM | POA: Diagnosis not present

## 2016-01-08 DIAGNOSIS — R2689 Other abnormalities of gait and mobility: Secondary | ICD-10-CM | POA: Diagnosis not present

## 2016-01-08 DIAGNOSIS — R55 Syncope and collapse: Secondary | ICD-10-CM | POA: Diagnosis not present

## 2016-01-08 DIAGNOSIS — M1711 Unilateral primary osteoarthritis, right knee: Secondary | ICD-10-CM | POA: Diagnosis not present

## 2016-01-11 DIAGNOSIS — R55 Syncope and collapse: Secondary | ICD-10-CM | POA: Diagnosis not present

## 2016-01-11 DIAGNOSIS — F0390 Unspecified dementia without behavioral disturbance: Secondary | ICD-10-CM | POA: Diagnosis not present

## 2016-01-11 DIAGNOSIS — I10 Essential (primary) hypertension: Secondary | ICD-10-CM | POA: Diagnosis not present

## 2016-01-11 DIAGNOSIS — R2689 Other abnormalities of gait and mobility: Secondary | ICD-10-CM | POA: Diagnosis not present

## 2016-01-11 DIAGNOSIS — M1711 Unilateral primary osteoarthritis, right knee: Secondary | ICD-10-CM | POA: Diagnosis not present

## 2016-01-15 DIAGNOSIS — I10 Essential (primary) hypertension: Secondary | ICD-10-CM | POA: Diagnosis not present

## 2016-01-15 DIAGNOSIS — R55 Syncope and collapse: Secondary | ICD-10-CM | POA: Diagnosis not present

## 2016-01-15 DIAGNOSIS — M1711 Unilateral primary osteoarthritis, right knee: Secondary | ICD-10-CM | POA: Diagnosis not present

## 2016-01-15 DIAGNOSIS — F0390 Unspecified dementia without behavioral disturbance: Secondary | ICD-10-CM | POA: Diagnosis not present

## 2016-01-15 DIAGNOSIS — R2689 Other abnormalities of gait and mobility: Secondary | ICD-10-CM | POA: Diagnosis not present

## 2016-01-16 DIAGNOSIS — R55 Syncope and collapse: Secondary | ICD-10-CM | POA: Diagnosis not present

## 2016-01-16 DIAGNOSIS — I10 Essential (primary) hypertension: Secondary | ICD-10-CM | POA: Diagnosis not present

## 2016-01-16 DIAGNOSIS — R2689 Other abnormalities of gait and mobility: Secondary | ICD-10-CM | POA: Diagnosis not present

## 2016-01-16 DIAGNOSIS — M1711 Unilateral primary osteoarthritis, right knee: Secondary | ICD-10-CM | POA: Diagnosis not present

## 2016-01-16 DIAGNOSIS — F0390 Unspecified dementia without behavioral disturbance: Secondary | ICD-10-CM | POA: Diagnosis not present

## 2016-01-17 DIAGNOSIS — R2689 Other abnormalities of gait and mobility: Secondary | ICD-10-CM | POA: Diagnosis not present

## 2016-01-17 DIAGNOSIS — M1711 Unilateral primary osteoarthritis, right knee: Secondary | ICD-10-CM | POA: Diagnosis not present

## 2016-01-17 DIAGNOSIS — F0390 Unspecified dementia without behavioral disturbance: Secondary | ICD-10-CM | POA: Diagnosis not present

## 2016-01-17 DIAGNOSIS — R55 Syncope and collapse: Secondary | ICD-10-CM | POA: Diagnosis not present

## 2016-01-17 DIAGNOSIS — I10 Essential (primary) hypertension: Secondary | ICD-10-CM | POA: Diagnosis not present

## 2016-01-28 DIAGNOSIS — R2689 Other abnormalities of gait and mobility: Secondary | ICD-10-CM | POA: Diagnosis not present

## 2016-01-28 DIAGNOSIS — F0390 Unspecified dementia without behavioral disturbance: Secondary | ICD-10-CM | POA: Diagnosis not present

## 2016-01-28 DIAGNOSIS — M1711 Unilateral primary osteoarthritis, right knee: Secondary | ICD-10-CM | POA: Diagnosis not present

## 2016-01-28 DIAGNOSIS — I10 Essential (primary) hypertension: Secondary | ICD-10-CM | POA: Diagnosis not present

## 2016-01-28 DIAGNOSIS — R55 Syncope and collapse: Secondary | ICD-10-CM | POA: Diagnosis not present

## 2016-01-29 DIAGNOSIS — M1711 Unilateral primary osteoarthritis, right knee: Secondary | ICD-10-CM | POA: Diagnosis not present

## 2016-01-29 DIAGNOSIS — I10 Essential (primary) hypertension: Secondary | ICD-10-CM | POA: Diagnosis not present

## 2016-01-29 DIAGNOSIS — R55 Syncope and collapse: Secondary | ICD-10-CM | POA: Diagnosis not present

## 2016-01-29 DIAGNOSIS — R2689 Other abnormalities of gait and mobility: Secondary | ICD-10-CM | POA: Diagnosis not present

## 2016-01-29 DIAGNOSIS — F0390 Unspecified dementia without behavioral disturbance: Secondary | ICD-10-CM | POA: Diagnosis not present

## 2016-01-30 DIAGNOSIS — I10 Essential (primary) hypertension: Secondary | ICD-10-CM | POA: Diagnosis not present

## 2016-01-30 DIAGNOSIS — R55 Syncope and collapse: Secondary | ICD-10-CM | POA: Diagnosis not present

## 2016-01-30 DIAGNOSIS — R2689 Other abnormalities of gait and mobility: Secondary | ICD-10-CM | POA: Diagnosis not present

## 2016-01-30 DIAGNOSIS — M1711 Unilateral primary osteoarthritis, right knee: Secondary | ICD-10-CM | POA: Diagnosis not present

## 2016-01-30 DIAGNOSIS — F0390 Unspecified dementia without behavioral disturbance: Secondary | ICD-10-CM | POA: Diagnosis not present

## 2016-02-05 DIAGNOSIS — I10 Essential (primary) hypertension: Secondary | ICD-10-CM | POA: Diagnosis not present

## 2016-02-05 DIAGNOSIS — Z6826 Body mass index (BMI) 26.0-26.9, adult: Secondary | ICD-10-CM | POA: Diagnosis not present

## 2016-02-05 DIAGNOSIS — F0390 Unspecified dementia without behavioral disturbance: Secondary | ICD-10-CM | POA: Diagnosis not present

## 2016-02-05 DIAGNOSIS — E784 Other hyperlipidemia: Secondary | ICD-10-CM | POA: Diagnosis not present

## 2016-02-05 DIAGNOSIS — Z8679 Personal history of other diseases of the circulatory system: Secondary | ICD-10-CM | POA: Diagnosis not present

## 2016-02-12 DIAGNOSIS — F0390 Unspecified dementia without behavioral disturbance: Secondary | ICD-10-CM | POA: Diagnosis not present

## 2016-02-12 DIAGNOSIS — I10 Essential (primary) hypertension: Secondary | ICD-10-CM | POA: Diagnosis not present

## 2016-02-12 DIAGNOSIS — D179 Benign lipomatous neoplasm, unspecified: Secondary | ICD-10-CM | POA: Diagnosis not present

## 2016-02-12 DIAGNOSIS — M25561 Pain in right knee: Secondary | ICD-10-CM | POA: Diagnosis not present

## 2016-02-12 DIAGNOSIS — Z6826 Body mass index (BMI) 26.0-26.9, adult: Secondary | ICD-10-CM | POA: Diagnosis not present

## 2016-02-13 DIAGNOSIS — R2689 Other abnormalities of gait and mobility: Secondary | ICD-10-CM | POA: Diagnosis not present

## 2016-02-13 DIAGNOSIS — R55 Syncope and collapse: Secondary | ICD-10-CM | POA: Diagnosis not present

## 2016-02-13 DIAGNOSIS — M1711 Unilateral primary osteoarthritis, right knee: Secondary | ICD-10-CM | POA: Diagnosis not present

## 2016-02-13 DIAGNOSIS — F0390 Unspecified dementia without behavioral disturbance: Secondary | ICD-10-CM | POA: Diagnosis not present

## 2016-02-13 DIAGNOSIS — I10 Essential (primary) hypertension: Secondary | ICD-10-CM | POA: Diagnosis not present

## 2016-02-20 DIAGNOSIS — M25561 Pain in right knee: Secondary | ICD-10-CM | POA: Diagnosis not present

## 2016-02-20 DIAGNOSIS — M25562 Pain in left knee: Secondary | ICD-10-CM | POA: Diagnosis not present

## 2016-04-09 DIAGNOSIS — M170 Bilateral primary osteoarthritis of knee: Secondary | ICD-10-CM | POA: Diagnosis not present

## 2016-04-16 DIAGNOSIS — M170 Bilateral primary osteoarthritis of knee: Secondary | ICD-10-CM | POA: Diagnosis not present

## 2016-04-23 DIAGNOSIS — M170 Bilateral primary osteoarthritis of knee: Secondary | ICD-10-CM | POA: Diagnosis not present

## 2016-05-19 DIAGNOSIS — F0390 Unspecified dementia without behavioral disturbance: Secondary | ICD-10-CM | POA: Diagnosis not present

## 2016-05-19 DIAGNOSIS — I10 Essential (primary) hypertension: Secondary | ICD-10-CM | POA: Diagnosis not present

## 2016-05-19 DIAGNOSIS — M25561 Pain in right knee: Secondary | ICD-10-CM | POA: Diagnosis not present

## 2016-05-19 DIAGNOSIS — Z1389 Encounter for screening for other disorder: Secondary | ICD-10-CM | POA: Diagnosis not present

## 2016-05-19 DIAGNOSIS — Z6825 Body mass index (BMI) 25.0-25.9, adult: Secondary | ICD-10-CM | POA: Diagnosis not present

## 2016-05-28 ENCOUNTER — Encounter (HOSPITAL_COMMUNITY): Payer: Self-pay

## 2016-05-28 ENCOUNTER — Emergency Department (HOSPITAL_COMMUNITY)
Admission: EM | Admit: 2016-05-28 | Discharge: 2016-05-28 | Disposition: A | Discharge status: Home / Self Care | Payer: Medicare Other | Attending: Emergency Medicine | Admitting: Emergency Medicine

## 2016-05-28 DIAGNOSIS — S61411A Laceration without foreign body of right hand, initial encounter: Secondary | ICD-10-CM | POA: Diagnosis not present

## 2016-05-28 DIAGNOSIS — X58XXXA Exposure to other specified factors, initial encounter: Secondary | ICD-10-CM | POA: Insufficient documentation

## 2016-05-28 DIAGNOSIS — Z8673 Personal history of transient ischemic attack (TIA), and cerebral infarction without residual deficits: Secondary | ICD-10-CM | POA: Insufficient documentation

## 2016-05-28 DIAGNOSIS — Y929 Unspecified place or not applicable: Secondary | ICD-10-CM | POA: Insufficient documentation

## 2016-05-28 DIAGNOSIS — I10 Essential (primary) hypertension: Secondary | ICD-10-CM | POA: Insufficient documentation

## 2016-05-28 DIAGNOSIS — Y939 Activity, unspecified: Secondary | ICD-10-CM | POA: Insufficient documentation

## 2016-05-28 DIAGNOSIS — Z7982 Long term (current) use of aspirin: Secondary | ICD-10-CM | POA: Diagnosis not present

## 2016-05-28 DIAGNOSIS — Y999 Unspecified external cause status: Secondary | ICD-10-CM | POA: Insufficient documentation

## 2016-05-28 NOTE — ED Provider Notes (Signed)
Cathy Johnson Provider Note   CSN: [id number] Arrival date & time: 05/28/16  [id number]  First Provider Contact:  None       History   Chief Complaint Chief Complaint  Patient presents with  . Extremity Laceration    right hand    HPI Cathy Johnson is a 82 y.o. female.  HPI   82 year old female with a history of dementia presents today with a skin tear to her right hand. Patient's son is at bedside reports noticing the fresh skin tear last night around 10 PM ( 10 hours prior). He notes that he cleaned the wound very well, with alcohol and water, placed Neosporin on it and covered it. He denies any surrounding redness discharge him significant pain. No other injuries noted. History of significant skin infections.    Past Medical History:  Diagnosis Date  . Arthritis   . Dementia   . Hypertension   . Osteoarthritis of right knee 05/02/2014  . PFO (patent foramen ovale)    PFO by bubble study 02/05/11 TEE  . Stroke [facility])     Patient Active Problem List   Diagnosis Date Noted  . AKI (acute kidney injury) ([place]) 01/06/2016  . Syncope 01/06/2016  . Dementia 01/06/2016  . HTN (hypertension) 01/06/2016  . Anemia 01/06/2016  . Osteoarthritis of right knee 05/02/2014  . Knee osteoarthritis 05/02/2014    Past Surgical History:  Procedure Laterality Date  . BREAST SURGERY     LT BREAST MASS REMOVED  . CARPAL TUNNEL RELEASE    . KNEE ARTHROSCOPY     RIGHT  . PARTIAL KNEE ARTHROPLASTY Right 05/02/2014   Procedure: UNICOMPARTMENTAL KNEE;  Surgeon: Johnny Bridge, MD;  Location: [facility];  Service: Orthopedics;  Laterality: Right;    OB History    No data available       Home Medications    Prior to Admission medications   Medication Sig Start Date End Date Taking? Authorizing Provider  amLODipine (NORVASC) 5 MG tablet Take 5 mg by mouth daily.    Historical Provider, MD  aspirin 325 MG tablet Take 325 mg by mouth daily.    Historical Provider, MD  atorvastatin  (LIPITOR) 40 MG tablet Take 40 mg by mouth daily.    Historical Provider, MD  LORazepam (ATIVAN) 0.5 MG tablet Take 0.5 mg by mouth 2 (two) times daily.    Historical Provider, MD  memantine (NAMENDA) 10 MG tablet Take 10 mg by mouth 2 (two) times daily.    Historical Provider, MD  telmisartan (MICARDIS) 40 MG tablet Take 40 mg by mouth 2 (two) times daily.    Historical Provider, MD  vitamin B-12 (CYANOCOBALAMIN) 1000 MCG tablet Take 1,000 mcg by mouth daily.    Historical Provider, MD    Family History History reviewed. No pertinent family history.  Social History Social History  Substance Use Topics  . Smoking status: Never Smoker  . Smokeless tobacco: Never Used  . Alcohol use No     Allergies   Review of patient's allergies indicates no known allergies.   Review of Systems Review of Systems  All other systems reviewed and are negative.    Physical Exam Updated Vital Signs BP 147/82   Pulse 80   Temp 98 F (36.7 C) (Oral)   Resp 14   Ht 5' (1.524 m)   Wt 59.9 kg   SpO2 99%   BMI 25.78 kg/m   Physical Exam  Constitutional: She is oriented to person, place,  and time. She appears well-developed and well-nourished.  HENT:  Head: Normocephalic and atraumatic.  Eyes: Conjunctivae are normal. Pupils are equal, round, and reactive to light. Right eye exhibits no discharge. Left eye exhibits no discharge. No scleral icterus.  Neck: Normal range of motion. No JVD present. No tracheal deviation present.  Pulmonary/Chest: Effort normal. No stridor.  Neurological: She is alert and oriented to person, place, and time. Coordination normal.  Skin:  5 cm skin tear to the dorsal aspect of the right hand, well approximated. No discharge surrounding redness  Psychiatric: She has a normal mood and affect. Her behavior is normal. Judgment and thought content normal.  Nursing note and vitals reviewed.    ED Treatments / Results  Labs (all labs ordered are listed, but only  abnormal results are displayed) Labs Reviewed - No data to display  EKG  EKG Interpretation None       Radiology No results found.  Procedures Procedures (including critical care time)  Medications Ordered in ED Medications - No data to display   Initial Impression / Assessment and Plan / ED Course  I have reviewed the triage vital signs and the nursing notes.  Pertinent labs & imaging results that were available during my care of the patient were reviewed by me and considered in my medical decision making (see chart for details).  Clinical Course      Final Clinical Impressions(s) / ED Diagnoses   Final diagnoses:  Skin tear of hand without complication, right, initial encounter    82 year old female presents today with skin tear to right hand. This is a 30 healing with no signs of infectious etiology. Bacitracin applied dressing placed by myself. Patient will need close follow-up with primary care, and strict return precautions given. Patient's son verbalized understanding and agreement to today's plan  New Prescriptions Discharge Medication List as of 05/28/2016  9:44 AM       Okey Regal, PA-C 05/28/16 [place], MD 05/29/16 [phone]

## 2016-05-28 NOTE — ED Triage Notes (Signed)
Pt. Coming from home with a right hand laceration. Pt. Hx of alzheimers and cannot report how she injured it. Pt. 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 Pt. Accompanied by her son.

## 2016-05-28 NOTE — Discharge Instructions (Signed)
Please follow-up with PCP in two days for re evaluation

## 2016-06-04 DIAGNOSIS — M170 Bilateral primary osteoarthritis of knee: Secondary | ICD-10-CM | POA: Diagnosis not present

## 2016-06-17 DIAGNOSIS — Z111 Encounter for screening for respiratory tuberculosis: Secondary | ICD-10-CM | POA: Diagnosis not present

## 2016-06-24 DIAGNOSIS — Z961 Presence of intraocular lens: Secondary | ICD-10-CM | POA: Diagnosis not present

## 2016-07-03 ENCOUNTER — Emergency Department (HOSPITAL_COMMUNITY)
Admission: EM | Admit: 2016-07-03 | Discharge: 2016-07-03 | Disposition: A | Discharge status: Home / Self Care | Payer: Medicare Other | Attending: Emergency Medicine | Admitting: Emergency Medicine

## 2016-07-03 ENCOUNTER — Encounter (HOSPITAL_COMMUNITY): Payer: Self-pay | Admitting: Emergency Medicine

## 2016-07-03 DIAGNOSIS — Z96651 Presence of right artificial knee joint: Secondary | ICD-10-CM | POA: Insufficient documentation

## 2016-07-03 DIAGNOSIS — R2689 Other abnormalities of gait and mobility: Secondary | ICD-10-CM | POA: Diagnosis not present

## 2016-07-03 DIAGNOSIS — I10 Essential (primary) hypertension: Secondary | ICD-10-CM | POA: Insufficient documentation

## 2016-07-03 DIAGNOSIS — W19XXXA Unspecified fall, initial encounter: Secondary | ICD-10-CM

## 2016-07-03 DIAGNOSIS — Y939 Activity, unspecified: Secondary | ICD-10-CM | POA: Insufficient documentation

## 2016-07-03 DIAGNOSIS — W010XXA Fall on same level from slipping, tripping and stumbling without subsequent striking against object, initial encounter: Secondary | ICD-10-CM | POA: Diagnosis not present

## 2016-07-03 DIAGNOSIS — T1490 Injury, unspecified: Secondary | ICD-10-CM | POA: Diagnosis not present

## 2016-07-03 DIAGNOSIS — Y999 Unspecified external cause status: Secondary | ICD-10-CM | POA: Insufficient documentation

## 2016-07-03 DIAGNOSIS — R259 Unspecified abnormal involuntary movements: Secondary | ICD-10-CM | POA: Diagnosis not present

## 2016-07-03 DIAGNOSIS — F0390 Unspecified dementia without behavioral disturbance: Secondary | ICD-10-CM | POA: Diagnosis not present

## 2016-07-03 DIAGNOSIS — Z8673 Personal history of transient ischemic attack (TIA), and cerebral infarction without residual deficits: Secondary | ICD-10-CM | POA: Insufficient documentation

## 2016-07-03 DIAGNOSIS — Y9289 Other specified places as the place of occurrence of the external cause: Secondary | ICD-10-CM | POA: Insufficient documentation

## 2016-07-03 DIAGNOSIS — R402411 Glasgow coma scale score 13-15, in the field [EMT or ambulance]: Secondary | ICD-10-CM | POA: Diagnosis not present

## 2016-07-03 DIAGNOSIS — Z7982 Long term (current) use of aspirin: Secondary | ICD-10-CM | POA: Diagnosis not present

## 2016-07-03 LAB — URINALYSIS, ROUTINE W REFLEX MICROSCOPIC
BILIRUBIN URINE: NEGATIVE
GLUCOSE, UA: NEGATIVE mg/dL
HGB URINE DIPSTICK: NEGATIVE
KETONES UR: NEGATIVE mg/dL
Leukocytes, UA: NEGATIVE
Nitrite: NEGATIVE
PH: 6.5 (ref 5.0–8.0)
Protein, ur: NEGATIVE mg/dL
SPECIFIC GRAVITY, URINE: 1.012 (ref 1.005–1.030)

## 2016-07-03 NOTE — ED Notes (Signed)
Notified [facility] for transportation back home 

## 2016-07-03 NOTE — ED Notes (Signed)
Pt found to be out of the bed folding her sheets and getting dressed, pt re-oriented to the room, placed back in bed with call light, yellow socks, and fall band in place. Pt calm and cooperative.

## 2016-07-03 NOTE — Discharge Instructions (Signed)
Please read and follow all provided instructions.  Your diagnoses today include:  1. Fall, initial encounter    Tests performed today include: Vital signs. See below for your results today.   Medications prescribed:  Take as prescribed   Home care instructions:  Follow any educational materials contained in this packet.  Follow-up instructions: Please follow-up with your primary care provider for further evaluation of symptoms and treatment   Return instructions:  Please return to the Emergency Department if you do not get better, if you get worse, or new symptoms OR  - Fever (temperature greater than 101.24F)  - Bleeding that does not stop with holding pressure to the area    -Severe pain (please note that you may be more sore the day after your accident)  - Chest Pain  - Difficulty breathing  - Severe nausea or vomiting  - Inability to tolerate food and liquids  - Passing out  - Skin becoming red around your wounds  - Change in mental status (confusion or lethargy)  - New numbness or weakness    Please return if you have any other emergent concerns.  Additional Information:  Your vital signs today were: BP 160/89 (BP Location: Right Arm)    Pulse 87    Temp 97.3 F (36.3 C) (Oral)    Resp 20    Ht 5\' 4"  (1.626 m)    SpO2 95%  If your blood pressure (BP) was elevated above 135/85 this visit, please have this repeated by your doctor within one month. ---------------

## 2016-07-03 NOTE — ED Provider Notes (Signed)
[facility] DEPT Provider Note   CSN: [id number] Arrival date & time: 07/03/16  [id number]  History   Chief Complaint Chief Complaint  Patient presents with  . Fall    witnessed; no anticoagulants; hx dementia    HPI Cathy Johnson is a 82 y.o. female.  HPI  82 y.o. female with a hx of Dementia, HTN, presents to the Emergency Department today s/p mechanical fall at [facility] facility. Per EMS, fall was witnessed by roommate. Pt tripped and fell face forward and was found by EMS prone on the floor. Pt currently denies any pain. No N/V. No headaches. No vision changes. No CP/SOB/ABD pain. Moving x4 extremities without difficulty. Full ROM of neck. EMS placed C Collar in place as precaution. Pt is not on any anticoagulation.    Level V Caveat: Dementia  Past Medical History:  Diagnosis Date  . Arthritis   . Dementia   . Hypertension   . Osteoarthritis of right knee 05/02/2014  . PFO (patent foramen ovale)    PFO by bubble study 02/05/11 TEE  . Stroke [facility])     Patient Active Problem List   Diagnosis Date Noted  . AKI (acute kidney injury) ([place]) 01/06/2016  . Syncope 01/06/2016  . Dementia 01/06/2016  . HTN (hypertension) 01/06/2016  . Anemia 01/06/2016  . Osteoarthritis of right knee 05/02/2014  . Knee osteoarthritis 05/02/2014    Past Surgical History:  Procedure Laterality Date  . BREAST SURGERY     LT BREAST MASS REMOVED  . CARPAL TUNNEL RELEASE    . KNEE ARTHROSCOPY     RIGHT  . PARTIAL KNEE ARTHROPLASTY Right 05/02/2014   Procedure: UNICOMPARTMENTAL KNEE;  Surgeon: Johnny Bridge, MD;  Location: [facility];  Service: Orthopedics;  Laterality: Right;    OB History    No data available       Home Medications    Prior to Admission medications   Medication Sig Start Date End Date Taking? Authorizing Provider  amLODipine (NORVASC) 5 MG tablet Take 5 mg by mouth daily.    Historical Provider, MD  aspirin 325 MG tablet Take 325 mg by mouth daily.    Historical  Provider, MD  atorvastatin (LIPITOR) 40 MG tablet Take 40 mg by mouth daily.    Historical Provider, MD  LORazepam (ATIVAN) 0.5 MG tablet Take 0.5 mg by mouth 2 (two) times daily.    Historical Provider, MD  memantine (NAMENDA) 10 MG tablet Take 10 mg by mouth 2 (two) times daily.    Historical Provider, MD  telmisartan (MICARDIS) 40 MG tablet Take 40 mg by mouth 2 (two) times daily.    Historical Provider, MD  vitamin B-12 (CYANOCOBALAMIN) 1000 MCG tablet Take 1,000 mcg by mouth daily.    Historical Provider, MD    Family History History reviewed. No pertinent family history.  Social History Social History  Substance Use Topics  . Smoking status: Never Smoker  . Smokeless tobacco: Never Used  . Alcohol use No     Allergies   Review of patient's allergies indicates no known allergies.   Review of Systems Review of Systems  Unable to perform ROS: Dementia   Physical Exam Updated Vital Signs Ht 5\' 4"  (1.626 m)   Physical Exam  Constitutional: Vital signs are normal. She appears well-developed and well-nourished.  HENT:  Head: Normocephalic and atraumatic. Head is without raccoon's eyes, without Battle's sign, without contusion and without laceration.  Right Ear: Hearing normal.  Left Ear: Hearing normal.  Eyes: Conjunctivae and EOM are normal. Pupils are equal, round, and reactive to light.  Neck: Trachea normal, normal range of motion, full passive range of motion without pain and phonation normal. Neck supple. No spinous process tenderness and no muscular tenderness present. No neck rigidity. No edema, no erythema and normal range of motion present.  Cardiovascular: Normal rate, regular rhythm, normal heart sounds and intact distal pulses.   Pulmonary/Chest: Effort normal and breath sounds normal. She has no wheezes. She has no rales. She exhibits no tenderness.  Abdominal: Soft.  Musculoskeletal: Normal range of motion.       Cervical back: Normal. She exhibits normal  range of motion, no tenderness, no deformity and no pain.       Thoracic back: Normal.       Lumbar back: Normal.  Neurological: She is alert. She has normal strength. She is disoriented (time). No cranial nerve deficit or sensory deficit.  Cranial Nerves:  II: Pupils equal, round, reactive to light III,IV, VI: ptosis not present, extra-ocular motions intact bilaterally  V,VII: smile symmetric, facial light touch sensation equal VIII: hearing grossly normal bilaterally  IX,X: midline uvula rise  XI: bilateral shoulder shrug equal and strong XII: midline tongue extension  Skin: Skin is warm and dry.  Psychiatric: She has a normal mood and affect. Her speech is normal and behavior is normal. Thought content normal.  Nursing note and vitals reviewed.  ED Treatments / Results  Labs (all labs ordered are listed, but only abnormal results are displayed) Labs Reviewed  URINALYSIS, ROUTINE W REFLEX MICROSCOPIC (NOT AT [facility])    EKG  EKG Interpretation  Date/Time:  Thursday July 03 2016 07:22:26 EDT Ventricular Rate:  80 PR Interval:    QRS Duration: 87 QT Interval:  376 QTC Calculation: 434 R Axis:   0 Text Interpretation:  Sinus rhythm Multiple premature complexes, vent & supraven Low voltage, precordial leads RSR' in V1 or V2, probably normal variant No significant change since last tracing Confirmed by FLOYD MD, DANIEL [id number]) on 07/03/2016 8:29:09 AM      Radiology No results found.  Procedures Procedures (including critical care time)  Medications Ordered in ED Medications - No data to display   Initial Impression / Assessment and Plan / ED Course  I have reviewed the triage vital signs and the nursing notes.  Pertinent labs & imaging results that were available during my care of the patient were reviewed by me and considered in my medical decision making (see chart for details).  Clinical Course    Final Clinical Impressions(s) / ED Diagnoses  I have reviewed  and evaluated the relevant laboratory values I have reviewed and evaluated the relevant imaging studies.  I have interpreted the relevant EKG. I have reviewed the relevant previous healthcare records. I have reviewed EMS Documentation. I obtained HPI from historian. Patient discussed with supervising physician  ED Course:  Assessment: Pt is a 3yF with hx Dementia, HTN who presents with mechanical Fall this AM witnessed by roommate at [facility] facility. On exam, pt in NAD. Nontoxic/nonseptic appearing. VSS. Afebrile. Lungs CTA. Heart RRR. Abdomen nontender soft. CN evaluated and unremarkable. No pain currently. No pain on palpation of neck, trunk, x4 extremities. Full ROM without difficulty. UA unremarkable. EKG unremarkable. Seen and evaluated by supervising physician. Plan is to [place]. At time of discharge, Patient is in no acute distress. Vital Signs are stable. Patient is able to ambulate. Patient able to tolerate PO.    Disposition/Plan:  DC Home Additional Verbal discharge instructions given and discussed with patient.  Pt Instructed to f/u with PCP in the next week for evaluation and treatment of symptoms. Return precautions given Pt acknowledges and agrees with plan  Supervising Physician Deno Etienne, DO   Final diagnoses:  Fall, initial encounter    New Prescriptions New Prescriptions   No medications on file     Shary Decamp, PA-C 07/03/16 [place], DO 07/03/16 [id number]

## 2016-07-03 NOTE — ED Triage Notes (Signed)
Pt presents from [facility] d/t fall that was witnessed by her roommate; pt was found by staff prone on floor; pt denies any pain or symptoms from fall; d/t hx of dementia, EMS placed c-collar as precaution; EMS also noted that patients EKG was sinus arrhythmia

## 2016-09-14 ENCOUNTER — Encounter (HOSPITAL_COMMUNITY): Payer: Self-pay

## 2016-09-14 ENCOUNTER — Emergency Department (HOSPITAL_COMMUNITY): Payer: Medicare Other

## 2016-09-14 ENCOUNTER — Emergency Department (HOSPITAL_COMMUNITY)
Admission: EM | Admit: 2016-09-14 | Discharge: 2016-09-14 | Disposition: A | Discharge status: Home / Self Care | Payer: Medicare Other | Attending: Emergency Medicine | Admitting: Emergency Medicine

## 2016-09-14 DIAGNOSIS — R079 Chest pain, unspecified: Secondary | ICD-10-CM | POA: Diagnosis not present

## 2016-09-14 DIAGNOSIS — I10 Essential (primary) hypertension: Secondary | ICD-10-CM | POA: Insufficient documentation

## 2016-09-14 DIAGNOSIS — R0789 Other chest pain: Secondary | ICD-10-CM | POA: Diagnosis not present

## 2016-09-14 DIAGNOSIS — Z96651 Presence of right artificial knee joint: Secondary | ICD-10-CM | POA: Insufficient documentation

## 2016-09-14 DIAGNOSIS — Z8673 Personal history of transient ischemic attack (TIA), and cerebral infarction without residual deficits: Secondary | ICD-10-CM | POA: Diagnosis not present

## 2016-09-14 DIAGNOSIS — Z7982 Long term (current) use of aspirin: Secondary | ICD-10-CM | POA: Insufficient documentation

## 2016-09-14 LAB — CBC
HEMATOCRIT: 36.8 % (ref 36.0–46.0)
Hemoglobin: 11.9 g/dL — ABNORMAL LOW (ref 12.0–15.0)
MCH: 28 pg (ref 26.0–34.0)
MCHC: 32.3 g/dL (ref 30.0–36.0)
MCV: 86.6 fL (ref 78.0–100.0)
PLATELETS: 272 10*3/uL (ref 150–400)
RBC: 4.25 MIL/uL (ref 3.87–5.11)
RDW: 16.4 % — AB (ref 11.5–15.5)
WBC: 8 10*3/uL (ref 4.0–10.5)

## 2016-09-14 LAB — BASIC METABOLIC PANEL
Anion gap: 8 (ref 5–15)
BUN: 13 mg/dL (ref 6–20)
CALCIUM: 8.8 mg/dL — AB (ref 8.9–10.3)
CO2: 25 mmol/L (ref 22–32)
CREATININE: 0.83 mg/dL (ref 0.44–1.00)
Chloride: 102 mmol/L (ref 101–111)
GFR calc Af Amer: >60 mL/min (ref >60)
GLUCOSE: 94 mg/dL (ref 65–99)
POTASSIUM: 3.4 mmol/L — AB (ref 3.5–5.1)
SODIUM: 135 mmol/L (ref 135–145)

## 2016-09-14 LAB — I-STAT TROPONIN, ED
TROPONIN I, POC: 0 ng/mL (ref 0.00–0.08)
Troponin i, poc: 0 ng/mL (ref 0.00–0.08)

## 2016-09-14 LAB — URINALYSIS, ROUTINE W REFLEX MICROSCOPIC
BILIRUBIN URINE: NEGATIVE
GLUCOSE, UA: NEGATIVE mg/dL
HGB URINE DIPSTICK: NEGATIVE
Ketones, ur: NEGATIVE mg/dL
Nitrite: NEGATIVE
PROTEIN: NEGATIVE mg/dL
Specific Gravity, Urine: 1.008 (ref 1.005–1.030)
pH: 7 (ref 5.0–8.0)

## 2016-09-14 LAB — URINE MICROSCOPIC-ADD ON: BACTERIA UA: NONE SEEN

## 2016-09-14 MED ORDER — POTASSIUM CHLORIDE CRYS ER 20 MEQ PO TBCR
40.0000 meq | EXTENDED_RELEASE_TABLET | Freq: Once | ORAL | Status: AC
  Administered 2016-09-14: 40 meq via ORAL
  Filled 2016-09-14: qty 2

## 2016-09-14 NOTE — ED Notes (Signed)
Patient transported to X-ray 

## 2016-09-14 NOTE — ED Provider Notes (Signed)
[facility] DEPT Provider Note   CSN: [id number] Arrival date & time: 09/14/16  1328     History   Chief Complaint Chief Complaint  Patient presents with  . Chest Pain    HPI Cathy Johnson is a 82 y.o. female.  HPI   82 year old female with history of dementia, prior stroke, hypertension brought here from nursing facility for chest pain. According to family members, patient was having difficulty walking early in the day having to hold the wall to walk. After she had her lunch patient was complaining of chest pain prompting her to be brought to the ER for further evaluation. Patient currently denies having any active chest pain. She denies fever, chills, lightheadedness, dizziness, chest pain, shortness of breath, abdominal pain, back pain. Level V caveats applies due to history of dementia.  I was able to reach out to the nurse that takes care of the pt at her facility.  According to the nurse, patient is normally vibrant and active but this morning she was having difficulty walking and having to use the guardrail to walk and also complaining of chest pain. She breakfast and went to sleep, she woke up in the pain was still there. She did not eat her lunch which is unusual for her.  Past Medical History:  Diagnosis Date  . Arthritis   . Dementia   . Hypertension   . Osteoarthritis of right knee 05/02/2014  . PFO (patent foramen ovale)    PFO by bubble study 02/05/11 TEE  . Stroke [facility])     Patient Active Problem List   Diagnosis Date Noted  . AKI (acute kidney injury) ([place]) 01/06/2016  . Syncope 01/06/2016  . Dementia 01/06/2016  . HTN (hypertension) 01/06/2016  . Anemia 01/06/2016  . Osteoarthritis of right knee 05/02/2014  . Knee osteoarthritis 05/02/2014    Past Surgical History:  Procedure Laterality Date  . BREAST SURGERY     LT BREAST MASS REMOVED  . CARPAL TUNNEL RELEASE    . KNEE ARTHROSCOPY     RIGHT  . PARTIAL KNEE ARTHROPLASTY Right 05/02/2014   Procedure: UNICOMPARTMENTAL KNEE;  Surgeon: Johnny Bridge, MD;  Location: [facility];  Service: Orthopedics;  Laterality: Right;    OB History    No data available       Home Medications    Prior to Admission medications   Medication Sig Start Date End Date Taking? Authorizing Provider  acetaminophen (TYLENOL) 500 MG tablet Take 500 mg by mouth every 8 (eight) hours as needed for mild pain.    Historical Provider, MD  amLODipine (NORVASC) 5 MG tablet Take 5 mg by mouth daily.    Historical Provider, MD  aspirin 325 MG tablet Take 325 mg by mouth daily.    Historical Provider, MD  atorvastatin (LIPITOR) 40 MG tablet Take 20 mg by mouth daily.     Historical Provider, MD  LORazepam (ATIVAN) 0.5 MG tablet Take 0.5 mg by mouth 2 (two) times daily as needed for anxiety.     Historical Provider, MD  memantine (NAMENDA XR) 28 MG CP24 24 hr capsule Take 28 mg by mouth daily.    Historical Provider, MD  telmisartan (MICARDIS) 40 MG tablet Take 40 mg by mouth daily.     Historical Provider, MD  vitamin B-12 (CYANOCOBALAMIN) 1000 MCG tablet Take 1,000 mcg by mouth daily.    Historical Provider, MD  Vitamin D, Ergocalciferol, (DRISDOL) 50000 units CAPS capsule Take 50,000 Units by mouth every 7 (seven)  days.    Historical Provider, MD    Family History No family history on file.  Social History Social History  Substance Use Topics  . Smoking status: Never Smoker  . Smokeless tobacco: Never Used  . Alcohol use No     Allergies   Patient has no known allergies.   Review of Systems Review of Systems  Unable to perform ROS: Dementia     Physical Exam Updated Vital Signs BP 158/92 (BP Location: Right Arm)   Pulse 83   Temp 97.9 F (36.6 C) (Oral)   Resp 21   SpO2 96%   Physical Exam  Constitutional: She appears well-developed and well-nourished. No distress.  HENT:  Head: Atraumatic.  Eyes: Conjunctivae are normal.  Neck: Neck supple.  Cardiovascular: Normal rate, regular  rhythm and intact distal pulses.   Pulmonary/Chest: Effort normal and breath sounds normal.  Abdominal: Soft. There is no tenderness.  Musculoskeletal: She exhibits no edema.  Neurological: She is alert. She has normal strength. No cranial nerve deficit or sensory deficit. Gait normal. GCS eye subscore is 4. GCS verbal subscore is 5. GCS motor subscore is 6.  Alert to self only. Unsure of situation, time or place  Skin: No rash noted.  Psychiatric: She has a normal mood and affect.  Nursing note and vitals reviewed.    ED Treatments / Results  Labs (all labs ordered are listed, but only abnormal results are displayed) Labs Reviewed  BASIC METABOLIC PANEL - Abnormal; Notable for the following:       Result Value   Potassium 3.4 (*)    Calcium 8.8 (*)    All other components within normal limits  CBC - Abnormal; Notable for the following:    Hemoglobin 11.9 (*)    RDW 16.4 (*)    All other components within normal limits  URINALYSIS, ROUTINE W REFLEX MICROSCOPIC (NOT AT [facility]) - Abnormal; Notable for the following:    Leukocytes, UA MODERATE (*)    All other components within normal limits  URINE MICROSCOPIC-ADD ON - Abnormal; Notable for the following:    Squamous Epithelial / LPF 0-5 (*)    All other components within normal limits  I-STAT TROPOININ, ED  I-STAT TROPOININ, ED    EKG  EKG Interpretation  Date/Time:  Sunday September 14 2016 13:28:48 EST Ventricular Rate:  87 PR Interval:    QRS Duration: 80 QT Interval:  386 QTC Calculation: 465 R Axis:   -7 Text Interpretation:  Sinus arrhythmia Low voltage, precordial leads RSR' in V1 or V2, probably normal variant No significant change since last tracing Confirmed by Winfred Leeds  MD, SAM [phone]) on 09/14/2016 1:42:48 PM Also confirmed by Winfred Leeds  MD, SAM [phone]), editor [place]  CCT, [place] ([id number])  on 09/14/2016 1:52:11 PM       Radiology Dg Chest 2 View  Result Date: 09/14/2016 CLINICAL DATA:  Chest pain.  EXAM: CHEST  2 VIEW COMPARISON:  Radiograph of January 06, 2016. FINDINGS: The heart size and mediastinal contours are within normal limits. Both lungs are clear. Atherosclerosis of thoracic aorta is noted. No pneumothorax or pleural effusion is noted. The visualized skeletal structures are unremarkable. IMPRESSION: No active cardiopulmonary disease.  Aortic atherosclerosis. Electronically Signed   By: Marijo Conception, M.D.   On: 09/14/2016 15:23    Procedures Procedures (including critical care time)  Medications Ordered in ED Medications  potassium chloride SA (K-DUR,KLOR-CON) CR tablet 40 mEq (40 mEq Oral Given 09/14/16 1459)  Initial Impression / Assessment and Plan / ED Course  I have reviewed the triage vital signs and the nursing notes.  Pertinent labs & imaging results that were available during my care of the patient were reviewed by me and considered in my medical decision making (see chart for details).  Clinical Course     BP 149/87   Pulse 83   Temp 97.9 F (36.6 C) (Oral)   Resp 18   SpO2 100%    Final Clinical Impressions(s) / ED Diagnoses   Final diagnoses:  Nonspecific chest pain    New Prescriptions New Prescriptions   No medications on file    2:50 PM  Patient with history of dementia brought here for evaluation of chest pain according to patient's nurse. She denies any active symptoms at this time. Her EKG shows no acute ischemic changes, the heart score is 3, a workup has been unremarkable. Will obtain a delta troponin at 5 PM. I have called patient's PCP, Dr. Silvestre Mesi office to request an outpt cardiac work up.  Care discussed with Dr. Winfred Leeds.    5:22 PM No active CP, normal delta trop.  Pt stable for discharge.  Her PCP will call and set up an appointment sometimes next week for further cardiac work up.  Return precaution discussed.        Domenic Moras, PA-C 09/14/16 [place], MD 09/14/16 [phone]

## 2016-09-14 NOTE — ED Provider Notes (Signed)
level caveat dementia. Patient had chest pain earlier today while at the facility with which she lives. She has no recall of chest pain today. EMS treated patient with Zofran as she became nauseated while in the ambulance. Patient is presently asymptomatic. On exam alert no distress lungs clear auscultation heart regular rate and rhythm no murmurs abdomen nondistended nontender extremities without edema. Heart score equals 3 based on EKG criteria age, risk factor of hypertension, and history   Orlie Dakin, MD 09/14/16 1651

## 2016-09-14 NOTE — ED Notes (Signed)
Ambulated to bathroom and back to room without difficulty.

## 2016-09-14 NOTE — Discharge Instructions (Signed)
Your doctor will call and schedule an appointment with you sometimes next week for further evaluation of your heart.  Return to the ER if your condition worsen or if you have other concerns.

## 2016-09-14 NOTE — ED Triage Notes (Signed)
Patient presents with ems from [facility] with chest pain that started this morning at 0800, the staff there advised her to sleep it off some, she woke up and was still having pain, her pain is centralized and worse with movement, she is also nauseous and received 4 of zofran iv with ems, patient is slightly confused with history of dementia

## 2016-09-18 DIAGNOSIS — I10 Essential (primary) hypertension: Secondary | ICD-10-CM | POA: Diagnosis not present

## 2016-09-18 DIAGNOSIS — M25561 Pain in right knee: Secondary | ICD-10-CM | POA: Diagnosis not present

## 2016-09-18 DIAGNOSIS — E538 Deficiency of other specified B group vitamins: Secondary | ICD-10-CM | POA: Diagnosis not present

## 2016-09-18 DIAGNOSIS — I639 Cerebral infarction, unspecified: Secondary | ICD-10-CM | POA: Diagnosis not present

## 2016-09-18 DIAGNOSIS — M5030 Other cervical disc degeneration, unspecified cervical region: Secondary | ICD-10-CM | POA: Diagnosis not present

## 2016-09-18 DIAGNOSIS — E784 Other hyperlipidemia: Secondary | ICD-10-CM | POA: Diagnosis not present

## 2016-09-18 DIAGNOSIS — F0390 Unspecified dementia without behavioral disturbance: Secondary | ICD-10-CM | POA: Diagnosis not present

## 2016-09-18 DIAGNOSIS — Z6824 Body mass index (BMI) 24.0-24.9, adult: Secondary | ICD-10-CM | POA: Diagnosis not present

## 2016-10-05 DIAGNOSIS — I10 Essential (primary) hypertension: Secondary | ICD-10-CM | POA: Diagnosis not present

## 2016-10-05 DIAGNOSIS — F0280 Dementia in other diseases classified elsewhere without behavioral disturbance: Secondary | ICD-10-CM | POA: Diagnosis not present

## 2016-10-05 DIAGNOSIS — F411 Generalized anxiety disorder: Secondary | ICD-10-CM | POA: Diagnosis not present

## 2016-10-05 DIAGNOSIS — G308 Other Alzheimer's disease: Secondary | ICD-10-CM | POA: Diagnosis not present

## 2016-10-05 DIAGNOSIS — M1711 Unilateral primary osteoarthritis, right knee: Secondary | ICD-10-CM | POA: Diagnosis not present

## 2016-10-05 DIAGNOSIS — G309 Alzheimer's disease, unspecified: Secondary | ICD-10-CM | POA: Diagnosis not present

## 2016-10-06 DIAGNOSIS — G309 Alzheimer's disease, unspecified: Secondary | ICD-10-CM | POA: Diagnosis not present

## 2016-10-06 DIAGNOSIS — F0280 Dementia in other diseases classified elsewhere without behavioral disturbance: Secondary | ICD-10-CM | POA: Diagnosis not present

## 2016-10-06 DIAGNOSIS — F411 Generalized anxiety disorder: Secondary | ICD-10-CM | POA: Diagnosis not present

## 2016-10-06 DIAGNOSIS — M1711 Unilateral primary osteoarthritis, right knee: Secondary | ICD-10-CM | POA: Diagnosis not present

## 2016-10-06 DIAGNOSIS — I10 Essential (primary) hypertension: Secondary | ICD-10-CM | POA: Diagnosis not present

## 2016-10-07 DIAGNOSIS — Z111 Encounter for screening for respiratory tuberculosis: Secondary | ICD-10-CM | POA: Diagnosis not present

## 2016-10-09 DIAGNOSIS — Z23 Encounter for immunization: Secondary | ICD-10-CM | POA: Diagnosis not present

## 2016-10-10 DIAGNOSIS — M1711 Unilateral primary osteoarthritis, right knee: Secondary | ICD-10-CM | POA: Diagnosis not present

## 2016-10-10 DIAGNOSIS — I10 Essential (primary) hypertension: Secondary | ICD-10-CM | POA: Diagnosis not present

## 2016-10-10 DIAGNOSIS — F411 Generalized anxiety disorder: Secondary | ICD-10-CM | POA: Diagnosis not present

## 2016-10-10 DIAGNOSIS — G309 Alzheimer's disease, unspecified: Secondary | ICD-10-CM | POA: Diagnosis not present

## 2016-10-10 DIAGNOSIS — F0280 Dementia in other diseases classified elsewhere without behavioral disturbance: Secondary | ICD-10-CM | POA: Diagnosis not present

## 2016-10-16 DIAGNOSIS — M1711 Unilateral primary osteoarthritis, right knee: Secondary | ICD-10-CM | POA: Diagnosis not present

## 2016-10-16 DIAGNOSIS — G309 Alzheimer's disease, unspecified: Secondary | ICD-10-CM | POA: Diagnosis not present

## 2016-10-16 DIAGNOSIS — F411 Generalized anxiety disorder: Secondary | ICD-10-CM | POA: Diagnosis not present

## 2016-10-16 DIAGNOSIS — I10 Essential (primary) hypertension: Secondary | ICD-10-CM | POA: Diagnosis not present

## 2016-10-16 DIAGNOSIS — F0280 Dementia in other diseases classified elsewhere without behavioral disturbance: Secondary | ICD-10-CM | POA: Diagnosis not present

## 2016-10-17 DIAGNOSIS — I10 Essential (primary) hypertension: Secondary | ICD-10-CM | POA: Diagnosis not present

## 2016-10-17 DIAGNOSIS — G309 Alzheimer's disease, unspecified: Secondary | ICD-10-CM | POA: Diagnosis not present

## 2016-10-17 DIAGNOSIS — F411 Generalized anxiety disorder: Secondary | ICD-10-CM | POA: Diagnosis not present

## 2016-10-17 DIAGNOSIS — F0280 Dementia in other diseases classified elsewhere without behavioral disturbance: Secondary | ICD-10-CM | POA: Diagnosis not present

## 2016-10-17 DIAGNOSIS — M1711 Unilateral primary osteoarthritis, right knee: Secondary | ICD-10-CM | POA: Diagnosis not present

## 2016-10-22 DIAGNOSIS — M1711 Unilateral primary osteoarthritis, right knee: Secondary | ICD-10-CM | POA: Diagnosis not present

## 2016-10-22 DIAGNOSIS — F0280 Dementia in other diseases classified elsewhere without behavioral disturbance: Secondary | ICD-10-CM | POA: Diagnosis not present

## 2016-10-22 DIAGNOSIS — F411 Generalized anxiety disorder: Secondary | ICD-10-CM | POA: Diagnosis not present

## 2016-10-22 DIAGNOSIS — G309 Alzheimer's disease, unspecified: Secondary | ICD-10-CM | POA: Diagnosis not present

## 2016-10-22 DIAGNOSIS — I10 Essential (primary) hypertension: Secondary | ICD-10-CM | POA: Diagnosis not present

## 2016-10-23 DIAGNOSIS — M1711 Unilateral primary osteoarthritis, right knee: Secondary | ICD-10-CM | POA: Diagnosis not present

## 2016-10-23 DIAGNOSIS — G309 Alzheimer's disease, unspecified: Secondary | ICD-10-CM | POA: Diagnosis not present

## 2016-10-23 DIAGNOSIS — F0280 Dementia in other diseases classified elsewhere without behavioral disturbance: Secondary | ICD-10-CM | POA: Diagnosis not present

## 2016-10-23 DIAGNOSIS — F411 Generalized anxiety disorder: Secondary | ICD-10-CM | POA: Diagnosis not present

## 2016-10-23 DIAGNOSIS — I10 Essential (primary) hypertension: Secondary | ICD-10-CM | POA: Diagnosis not present

## 2016-10-24 DIAGNOSIS — F411 Generalized anxiety disorder: Secondary | ICD-10-CM | POA: Diagnosis not present

## 2016-10-24 DIAGNOSIS — M1711 Unilateral primary osteoarthritis, right knee: Secondary | ICD-10-CM | POA: Diagnosis not present

## 2016-10-24 DIAGNOSIS — F0280 Dementia in other diseases classified elsewhere without behavioral disturbance: Secondary | ICD-10-CM | POA: Diagnosis not present

## 2016-10-24 DIAGNOSIS — I10 Essential (primary) hypertension: Secondary | ICD-10-CM | POA: Diagnosis not present

## 2016-10-24 DIAGNOSIS — G309 Alzheimer's disease, unspecified: Secondary | ICD-10-CM | POA: Diagnosis not present

## 2016-10-28 DIAGNOSIS — G309 Alzheimer's disease, unspecified: Secondary | ICD-10-CM | POA: Diagnosis not present

## 2016-10-28 DIAGNOSIS — M1711 Unilateral primary osteoarthritis, right knee: Secondary | ICD-10-CM | POA: Diagnosis not present

## 2016-10-28 DIAGNOSIS — F0280 Dementia in other diseases classified elsewhere without behavioral disturbance: Secondary | ICD-10-CM | POA: Diagnosis not present

## 2016-10-28 DIAGNOSIS — I10 Essential (primary) hypertension: Secondary | ICD-10-CM | POA: Diagnosis not present

## 2016-10-28 DIAGNOSIS — F411 Generalized anxiety disorder: Secondary | ICD-10-CM | POA: Diagnosis not present

## 2016-10-30 DIAGNOSIS — M1711 Unilateral primary osteoarthritis, right knee: Secondary | ICD-10-CM | POA: Diagnosis not present

## 2016-10-30 DIAGNOSIS — F0280 Dementia in other diseases classified elsewhere without behavioral disturbance: Secondary | ICD-10-CM | POA: Diagnosis not present

## 2016-10-30 DIAGNOSIS — G309 Alzheimer's disease, unspecified: Secondary | ICD-10-CM | POA: Diagnosis not present

## 2016-10-30 DIAGNOSIS — I10 Essential (primary) hypertension: Secondary | ICD-10-CM | POA: Diagnosis not present

## 2016-10-30 DIAGNOSIS — F411 Generalized anxiety disorder: Secondary | ICD-10-CM | POA: Diagnosis not present

## 2016-11-04 DIAGNOSIS — I10 Essential (primary) hypertension: Secondary | ICD-10-CM | POA: Diagnosis not present

## 2016-11-04 DIAGNOSIS — M1711 Unilateral primary osteoarthritis, right knee: Secondary | ICD-10-CM | POA: Diagnosis not present

## 2016-11-04 DIAGNOSIS — G309 Alzheimer's disease, unspecified: Secondary | ICD-10-CM | POA: Diagnosis not present

## 2016-11-04 DIAGNOSIS — F0280 Dementia in other diseases classified elsewhere without behavioral disturbance: Secondary | ICD-10-CM | POA: Diagnosis not present

## 2016-11-04 DIAGNOSIS — F411 Generalized anxiety disorder: Secondary | ICD-10-CM | POA: Diagnosis not present

## 2016-11-07 DIAGNOSIS — I10 Essential (primary) hypertension: Secondary | ICD-10-CM | POA: Diagnosis not present

## 2016-11-07 DIAGNOSIS — F411 Generalized anxiety disorder: Secondary | ICD-10-CM | POA: Diagnosis not present

## 2016-11-07 DIAGNOSIS — F0280 Dementia in other diseases classified elsewhere without behavioral disturbance: Secondary | ICD-10-CM | POA: Diagnosis not present

## 2016-11-07 DIAGNOSIS — G309 Alzheimer's disease, unspecified: Secondary | ICD-10-CM | POA: Diagnosis not present

## 2016-11-07 DIAGNOSIS — M1711 Unilateral primary osteoarthritis, right knee: Secondary | ICD-10-CM | POA: Diagnosis not present

## 2016-11-12 DIAGNOSIS — F411 Generalized anxiety disorder: Secondary | ICD-10-CM | POA: Diagnosis not present

## 2016-11-12 DIAGNOSIS — F0280 Dementia in other diseases classified elsewhere without behavioral disturbance: Secondary | ICD-10-CM | POA: Diagnosis not present

## 2016-11-12 DIAGNOSIS — G309 Alzheimer's disease, unspecified: Secondary | ICD-10-CM | POA: Diagnosis not present

## 2016-11-12 DIAGNOSIS — I10 Essential (primary) hypertension: Secondary | ICD-10-CM | POA: Diagnosis not present

## 2016-11-12 DIAGNOSIS — M1711 Unilateral primary osteoarthritis, right knee: Secondary | ICD-10-CM | POA: Diagnosis not present

## 2016-12-05 DIAGNOSIS — F0390 Unspecified dementia without behavioral disturbance: Secondary | ICD-10-CM | POA: Diagnosis not present

## 2016-12-05 DIAGNOSIS — Z0000 Encounter for general adult medical examination without abnormal findings: Secondary | ICD-10-CM | POA: Diagnosis not present

## 2016-12-05 DIAGNOSIS — I10 Essential (primary) hypertension: Secondary | ICD-10-CM | POA: Diagnosis not present

## 2016-12-05 DIAGNOSIS — M810 Age-related osteoporosis without current pathological fracture: Secondary | ICD-10-CM | POA: Diagnosis not present

## 2016-12-05 DIAGNOSIS — Z6825 Body mass index (BMI) 25.0-25.9, adult: Secondary | ICD-10-CM | POA: Diagnosis not present

## 2016-12-05 DIAGNOSIS — E538 Deficiency of other specified B group vitamins: Secondary | ICD-10-CM | POA: Diagnosis not present

## 2016-12-05 DIAGNOSIS — M25561 Pain in right knee: Secondary | ICD-10-CM | POA: Diagnosis not present

## 2016-12-05 DIAGNOSIS — I638 Other cerebral infarction: Secondary | ICD-10-CM | POA: Diagnosis not present

## 2016-12-08 DIAGNOSIS — D649 Anemia, unspecified: Secondary | ICD-10-CM | POA: Diagnosis not present

## 2016-12-08 DIAGNOSIS — D509 Iron deficiency anemia, unspecified: Secondary | ICD-10-CM | POA: Diagnosis not present

## 2017-01-12 DIAGNOSIS — M170 Bilateral primary osteoarthritis of knee: Secondary | ICD-10-CM | POA: Diagnosis not present

## 2017-03-04 DIAGNOSIS — M25561 Pain in right knee: Secondary | ICD-10-CM | POA: Diagnosis not present

## 2017-03-04 DIAGNOSIS — F0281 Dementia in other diseases classified elsewhere with behavioral disturbance: Secondary | ICD-10-CM | POA: Diagnosis not present

## 2017-03-04 DIAGNOSIS — M25562 Pain in left knee: Secondary | ICD-10-CM | POA: Diagnosis not present

## 2017-03-04 DIAGNOSIS — I10 Essential (primary) hypertension: Secondary | ICD-10-CM | POA: Diagnosis not present

## 2017-03-04 DIAGNOSIS — D6489 Other specified anemias: Secondary | ICD-10-CM | POA: Diagnosis not present

## 2017-03-04 DIAGNOSIS — G308 Other Alzheimer's disease: Secondary | ICD-10-CM | POA: Diagnosis not present

## 2017-03-04 DIAGNOSIS — M810 Age-related osteoporosis without current pathological fracture: Secondary | ICD-10-CM | POA: Diagnosis not present

## 2017-03-04 DIAGNOSIS — F3289 Other specified depressive episodes: Secondary | ICD-10-CM | POA: Diagnosis not present

## 2017-03-04 DIAGNOSIS — Z8673 Personal history of transient ischemic attack (TIA), and cerebral infarction without residual deficits: Secondary | ICD-10-CM | POA: Diagnosis not present

## 2017-03-04 DIAGNOSIS — G309 Alzheimer's disease, unspecified: Secondary | ICD-10-CM | POA: Diagnosis not present

## 2017-03-04 DIAGNOSIS — M6281 Muscle weakness (generalized): Secondary | ICD-10-CM | POA: Diagnosis not present

## 2017-03-04 DIAGNOSIS — Z7982 Long term (current) use of aspirin: Secondary | ICD-10-CM | POA: Diagnosis not present

## 2017-03-06 ENCOUNTER — Observation Stay: Payer: Medicare Other

## 2017-03-06 ENCOUNTER — Encounter: Payer: Self-pay | Admitting: Emergency Medicine

## 2017-03-06 ENCOUNTER — Observation Stay
Admission: EM | Admit: 2017-03-06 | Discharge: 2017-03-08 | Disposition: A | Discharge status: Home / Self Care | Payer: Medicare Other | Attending: Internal Medicine | Admitting: Internal Medicine

## 2017-03-06 ENCOUNTER — Emergency Department: Payer: Medicare Other

## 2017-03-06 DIAGNOSIS — I639 Cerebral infarction, unspecified: Principal | ICD-10-CM | POA: Insufficient documentation

## 2017-03-06 DIAGNOSIS — R4781 Slurred speech: Secondary | ICD-10-CM | POA: Diagnosis not present

## 2017-03-06 DIAGNOSIS — I10 Essential (primary) hypertension: Secondary | ICD-10-CM | POA: Insufficient documentation

## 2017-03-06 DIAGNOSIS — Q211 Atrial septal defect: Secondary | ICD-10-CM | POA: Diagnosis not present

## 2017-03-06 DIAGNOSIS — Z96651 Presence of right artificial knee joint: Secondary | ICD-10-CM | POA: Diagnosis not present

## 2017-03-06 DIAGNOSIS — Z885 Allergy status to narcotic agent status: Secondary | ICD-10-CM | POA: Insufficient documentation

## 2017-03-06 DIAGNOSIS — Z7982 Long term (current) use of aspirin: Secondary | ICD-10-CM | POA: Insufficient documentation

## 2017-03-06 DIAGNOSIS — R4182 Altered mental status, unspecified: Secondary | ICD-10-CM

## 2017-03-06 DIAGNOSIS — Z79899 Other long term (current) drug therapy: Secondary | ICD-10-CM | POA: Diagnosis not present

## 2017-03-06 DIAGNOSIS — F0280 Dementia in other diseases classified elsewhere without behavioral disturbance: Secondary | ICD-10-CM | POA: Diagnosis not present

## 2017-03-06 DIAGNOSIS — G309 Alzheimer's disease, unspecified: Secondary | ICD-10-CM | POA: Insufficient documentation

## 2017-03-06 DIAGNOSIS — F0390 Unspecified dementia without behavioral disturbance: Secondary | ICD-10-CM | POA: Diagnosis not present

## 2017-03-06 DIAGNOSIS — R29810 Facial weakness: Secondary | ICD-10-CM | POA: Diagnosis not present

## 2017-03-06 DIAGNOSIS — Z8673 Personal history of transient ischemic attack (TIA), and cerebral infarction without residual deficits: Secondary | ICD-10-CM | POA: Diagnosis not present

## 2017-03-06 DIAGNOSIS — Z888 Allergy status to other drugs, medicaments and biological substances status: Secondary | ICD-10-CM | POA: Diagnosis not present

## 2017-03-06 DIAGNOSIS — R51 Headache: Secondary | ICD-10-CM | POA: Diagnosis not present

## 2017-03-06 DIAGNOSIS — R29704 NIHSS score 4: Secondary | ICD-10-CM | POA: Diagnosis not present

## 2017-03-06 DIAGNOSIS — E860 Dehydration: Secondary | ICD-10-CM | POA: Diagnosis not present

## 2017-03-06 LAB — COMPREHENSIVE METABOLIC PANEL
ALT: 19 U/L (ref 14–54)
AST: 26 U/L (ref 15–41)
Albumin: 4.3 g/dL (ref 3.5–5.0)
Alkaline Phosphatase: 124 U/L (ref 38–126)
Anion gap: 9 (ref 5–15)
BILIRUBIN TOTAL: 0.6 mg/dL (ref 0.3–1.2)
BUN: 18 mg/dL (ref 6–20)
CALCIUM: 9.6 mg/dL (ref 8.9–10.3)
CHLORIDE: 106 mmol/L (ref 101–111)
CO2: 24 mmol/L (ref 22–32)
Creatinine, Ser: 0.88 mg/dL (ref 0.44–1.00)
GFR calc Af Amer: >60 mL/min (ref >60)
GFR, EST NON AFRICAN AMERICAN: 57 mL/min — AB (ref >60)
Glucose, Bld: 129 mg/dL — ABNORMAL HIGH (ref 65–99)
Potassium: 3.6 mmol/L (ref 3.5–5.1)
Sodium: 139 mmol/L (ref 135–145)
Total Protein: 7.6 g/dL (ref 6.5–8.1)

## 2017-03-06 LAB — PROTIME-INR
INR: 1.08
Prothrombin Time: 14 seconds (ref 11.4–15.2)

## 2017-03-06 LAB — APTT: APTT: 29 s (ref 24–36)

## 2017-03-06 LAB — CBC
HEMATOCRIT: 37.3 % (ref 35.0–47.0)
HEMOGLOBIN: 12.5 g/dL (ref 12.0–16.0)
MCH: 28.6 pg (ref 26.0–34.0)
MCHC: 33.6 g/dL (ref 32.0–36.0)
MCV: 85.1 fL (ref 80.0–100.0)
Platelets: 295 10*3/uL (ref 150–440)
RBC: 4.38 MIL/uL (ref 3.80–5.20)
RDW: 16.4 % — ABNORMAL HIGH (ref 11.5–14.5)
WBC: 11.6 10*3/uL — ABNORMAL HIGH (ref 3.6–11.0)

## 2017-03-06 LAB — DIFFERENTIAL
BASOS ABS: 0.1 10*3/uL (ref 0–0.1)
Basophils Relative: 1 %
Eosinophils Absolute: 0 10*3/uL (ref 0–0.7)
Eosinophils Relative: 0 %
LYMPHS ABS: 1 10*3/uL (ref 1.0–3.6)
LYMPHS PCT: 8 %
MONOS PCT: 7 %
Monocytes Absolute: 0.8 10*3/uL (ref 0.2–0.9)
Neutro Abs: 9.8 10*3/uL — ABNORMAL HIGH (ref 1.4–6.5)
Neutrophils Relative %: 84 %

## 2017-03-06 LAB — ETHANOL: Alcohol, Ethyl (B): <5 mg/dL (ref <5)

## 2017-03-06 LAB — TROPONIN I

## 2017-03-06 LAB — GLUCOSE, CAPILLARY: Glucose-Capillary: 116 mg/dL — ABNORMAL HIGH (ref 65–99)

## 2017-03-06 MED ORDER — ACETAMINOPHEN 325 MG PO TABS
650.0000 mg | ORAL_TABLET | ORAL | Status: DC | PRN
  Administered 2017-03-08: 650 mg via ORAL
  Filled 2017-03-06: qty 2

## 2017-03-06 MED ORDER — AMLODIPINE BESYLATE 5 MG PO TABS
5.0000 mg | ORAL_TABLET | Freq: Every day | ORAL | Status: DC
  Administered 2017-03-07 – 2017-03-08 (×2): 5 mg via ORAL
  Filled 2017-03-06 (×2): qty 1

## 2017-03-06 MED ORDER — RISPERIDONE 1 MG PO TABS
1.0000 mg | ORAL_TABLET | Freq: Every day | ORAL | Status: DC
  Administered 2017-03-06 – 2017-03-07 (×2): 1 mg via ORAL
  Filled 2017-03-06 (×2): qty 1

## 2017-03-06 MED ORDER — IRBESARTAN 75 MG PO TABS
37.5000 mg | ORAL_TABLET | Freq: Every day | ORAL | Status: DC
  Administered 2017-03-07: 37.5 mg via ORAL
  Administered 2017-03-08: 75 mg via ORAL
  Filled 2017-03-06: qty 1
  Filled 2017-03-06: qty 2

## 2017-03-06 MED ORDER — ASPIRIN 81 MG PO CHEW
324.0000 mg | CHEWABLE_TABLET | Freq: Once | ORAL | Status: AC
  Administered 2017-03-06: 324 mg via ORAL
  Filled 2017-03-06: qty 4

## 2017-03-06 MED ORDER — SODIUM CHLORIDE 0.9 % IV SOLN
INTRAVENOUS | Status: DC
  Administered 2017-03-06: 16:00:00 via INTRAVENOUS

## 2017-03-06 MED ORDER — MEMANTINE HCL 5 MG PO TABS
10.0000 mg | ORAL_TABLET | Freq: Two times a day (BID) | ORAL | Status: DC
  Administered 2017-03-06 – 2017-03-08 (×4): 10 mg via ORAL
  Filled 2017-03-06 (×4): qty 2

## 2017-03-06 MED ORDER — ACETAMINOPHEN 650 MG RE SUPP: 650.0000 mg | RECTAL | Status: DC | PRN

## 2017-03-06 MED ORDER — VITAMIN D (ERGOCALCIFEROL) 1.25 MG (50000 UNIT) PO CAPS: 50000.0000 [IU] | ORAL_CAPSULE | ORAL | Status: DC

## 2017-03-06 MED ORDER — ASPIRIN EC 325 MG PO TBEC
325.0000 mg | DELAYED_RELEASE_TABLET | Freq: Every day | ORAL | Status: DC
  Administered 2017-03-07 – 2017-03-08 (×2): 325 mg via ORAL
  Filled 2017-03-06 (×2): qty 1

## 2017-03-06 MED ORDER — ACETAMINOPHEN 500 MG PO TABS: 500.0000 mg | ORAL_TABLET | Freq: Three times a day (TID) | ORAL | Status: DC | PRN

## 2017-03-06 MED ORDER — VITAMIN B-12 1000 MCG PO TABS
1000.0000 ug | ORAL_TABLET | Freq: Every day | ORAL | Status: DC
  Administered 2017-03-07 – 2017-03-08 (×2): 1000 ug via ORAL
  Filled 2017-03-06 (×2): qty 1

## 2017-03-06 MED ORDER — ATORVASTATIN CALCIUM 20 MG PO TABS
20.0000 mg | ORAL_TABLET | Freq: Every day | ORAL | Status: DC
  Administered 2017-03-06 – 2017-03-07 (×2): 20 mg via ORAL
  Filled 2017-03-06 (×2): qty 1

## 2017-03-06 MED ORDER — ACETAMINOPHEN 160 MG/5ML PO SOLN
650.0000 mg | ORAL | Status: DC | PRN
  Filled 2017-03-06: qty 20.3

## 2017-03-06 MED ORDER — ENOXAPARIN SODIUM 40 MG/0.4ML ~~LOC~~ SOLN
40.0000 mg | SUBCUTANEOUS | Status: DC
  Administered 2017-03-06 – 2017-03-07 (×2): 40 mg via SUBCUTANEOUS
  Filled 2017-03-06 (×2): qty 0.4

## 2017-03-06 MED ORDER — STROKE: EARLY STAGES OF RECOVERY BOOK
Freq: Once | Status: AC
  Administered 2017-03-06: 16:00:00

## 2017-03-06 MED ORDER — LORAZEPAM 0.5 MG PO TABS
0.5000 mg | ORAL_TABLET | Freq: Two times a day (BID) | ORAL | Status: DC
  Administered 2017-03-06 – 2017-03-08 (×5): 0.5 mg via ORAL
  Filled 2017-03-06 (×5): qty 1

## 2017-03-06 NOTE — Plan of Care (Signed)
Problem: Education: Goal: Knowledge of [facility] Education information/materials will improve Education to son  [facility]athy [facility]. Pt has dementia and unable to comprehend  Problem: Safety: Goal: Ability to remain free from injury will improve Outcome: Not Progressing Pt ansey and tries to get oob at times/ ativan given.  Son  At bedside . Soothing  Words  To pt.  Pt to be placed on low bed and mat.  Problem: Coping: Goal: Ability to identify strategies to decrease anxiety will improve Outcome: Progressing Calming soothing words and affect with pt.  Activity apron    Problem: Health Behavior/Discharge Planning: Goal: Ability to manage health-related needs will improve Outcome: Progressing Will get to baseline

## 2017-03-06 NOTE — Plan of Care (Signed)
Problem: Education: Goal: Knowledge of [facility] Education information/materials will improve Outcome: Not Progressing Pt confused  Problem: Education: Goal: Knowledge of disease or condition will improve Outcome: Not Progressing Pt confused Goal: Knowledge of secondary prevention will improve Outcome: Not Progressing Pt  confused Goal: Knowledge of patient specific risk factors addressed and post discharge goals established will improve Outcome: Not Progressing Pt confused  Problem: Coping: Goal: Ability to identify strategies to decrease anxiety will improve Outcome: Not Progressing Pt  confused

## 2017-03-06 NOTE — ED Notes (Signed)
Patient transported to CT 

## 2017-03-06 NOTE — ED Notes (Signed)
Pt found standing at edge of bed, pt placed back into bed per this RN and Elenore Rota, Therapist, sports.  Pt reoriented to place and situation.  Bed alarm placed at this time.

## 2017-03-06 NOTE — Care Management Obs Status (Signed)
[facility] NOTIFICATION   Patient Details  Name: Cathy Johnson MRN: [id number] Date of Birth: [birth year]   Medicare Observation Status Notification Given:  Yes (reviewed with son)    Beverly Sessions, RN 03/06/2017, 4:46 PM

## 2017-03-06 NOTE — ED Triage Notes (Signed)
Pt in via EMS from [facility], called out for possible stroke; per facility, pt with difficulty ambulating upon waking this morning with increased confusion from baseline as well.  Pt with weakness to RLE, slight right facial droop, and aphasia upon arrival.  Pt alert to self only, vitals WDL, MD to bedside.

## 2017-03-06 NOTE — ED Provider Notes (Signed)
[facility] Emergency Department Provider Note  ____________________________________________   First MD Initiated Contact with Patient 03/06/17 1126     (approximate)  I have reviewed the triage vital signs and the nursing notes.   HISTORY  Chief Complaint Extremity Weakness and Altered Mental Status   HPI Cathy Johnson is a 83 y.o. female with dementia from a memory care unit who is presenting emergency department today with altered mentation and confusion. Per EMS, the patient reportedly woke up confused and was unable to walk. The patient also told nursing that she feels confused. EMS reports the patient was negative for stroke screen. Patient is denying any pain at this time.   Past Medical History:  Diagnosis Date  . Arthritis   . Dementia   . Hypertension   . Osteoarthritis of right knee 05/02/2014  . PFO (patent foramen ovale)    PFO by bubble study 02/05/11 TEE  . Stroke [facility])     Patient Active Problem List   Diagnosis Date Noted  . AKI (acute kidney injury) ([place]) 01/06/2016  . Syncope 01/06/2016  . Dementia 01/06/2016  . HTN (hypertension) 01/06/2016  . Anemia 01/06/2016  . Osteoarthritis of right knee 05/02/2014  . Knee osteoarthritis 05/02/2014    Past Surgical History:  Procedure Laterality Date  . BREAST SURGERY     LT BREAST MASS REMOVED  . CARPAL TUNNEL RELEASE    . KNEE ARTHROSCOPY     RIGHT  . PARTIAL KNEE ARTHROPLASTY Right 05/02/2014   Procedure: UNICOMPARTMENTAL KNEE;  Surgeon: Johnny Bridge, MD;  Location: [facility];  Service: Orthopedics;  Laterality: Right;    Prior to Admission medications   Medication Sig Start Date End Date Taking? Authorizing Provider  acetaminophen (TYLENOL) 500 MG tablet Take 500 mg by mouth every 8 (eight) hours as needed for mild pain.    [provider]  amLODipine (NORVASC) 5 MG tablet Take 5 mg by mouth daily.    [provider]  aspirin 325 MG tablet Take 325 mg by  mouth daily.    [provider]  atorvastatin (LIPITOR) 40 MG tablet Take 20 mg by mouth daily.     [provider]  LORazepam (ATIVAN) 0.5 MG tablet Take 0.5 mg by mouth 2 (two) times daily as needed for anxiety.     [provider]  memantine (NAMENDA XR) 28 MG CP24 24 hr capsule Take 28 mg by mouth daily.    [provider]  telmisartan (MICARDIS) 40 MG tablet Take 40 mg by mouth daily.     [provider]  vitamin B-12 (CYANOCOBALAMIN) 1000 MCG tablet Take 1,000 mcg by mouth daily.    [provider]  Vitamin D, Ergocalciferol, (DRISDOL) 50000 units CAPS capsule Take 50,000 Units by mouth every 7 (seven) days.    [provider]    Allergies Boniva [ibandronic acid]; Crestor [rosuvastatin calcium]; Exelon [rivastigmine tartrate]; Fosamax [alendronate sodium]; Lipitor [atorvastatin]; Morphine and related; and Vagifem [estradiol]  No family history on file.  Social History Social History  Substance Use Topics  . Smoking status: Never Smoker  . Smokeless tobacco: Never Used  . Alcohol use No    Review of Systems  Constitutional: No fever/chills Eyes: No visual changes. ENT: No sore throat. Cardiovascular: Denies chest pain. Respiratory: Denies shortness of breath. Gastrointestinal: No abdominal pain.  No nausea, no vomiting.  No diarrhea.  No constipation. Genitourinary: Negative for dysuria. Musculoskeletal: Negative for back pain. Skin: Negative for rash.  Neurological: Negative for headaches, focal weakness or numbness.   ____________________________________________   PHYSICAL EXAM:  VITAL SIGNS: ED Triage Vitals  Enc Vitals Group     BP 03/06/17 1128 131/71     Pulse Rate 03/06/17 1130 99     Resp 03/06/17 1128 (!) 21     Temp 03/06/17 1128 97.5 F (36.4 C)     Temp src --      SpO2 03/06/17 1128 96 %     Weight 03/06/17 1129 138 lb (62.6 kg)     Height 03/06/17 1129 5\' 2"  (1.575 m)     Head  Circumference --      Peak Flow --      Pain Score --      Pain Loc --      Pain Edu? --      Excl. in [place]? --     Constitutional: Alert and oriented.  Eyes: Conjunctivae are normal.  Head: Atraumatic. Nose: No congestion/rhinnorhea. Mouth/Throat: Mucous membranes are moist.  Neck: No stridor.   Cardiovascular: Normal rate, regular rhythm. Grossly normal heart sounds.   Respiratory: Normal respiratory effort.  No retractions. Lungs CTAB. Gastrointestinal: Soft and nontender. No distention. No CVA tenderness. Musculoskeletal: No lower extremity tenderness nor edema.  No joint effusions. Neurologic:  Normal speech and language. Mild right facial droop with right lower extremity weakness. Small amount saliva trailing from the right side of the mouth. Skin:  Skin is warm, dry and intact. No rash noted. Psychiatric: Mood and affect are normal. Speech and behavior are normal.  NIH Stroke Scale   Person Administering Scale: Doran Stabler  Administer stroke scale items in the order listed. Record performance in each category after each subscale exam. Do not go back and change scores. Follow directions provided for each exam technique. Scores should reflect what the patient does, not what the clinician thinks the patient can do. The clinician should record answers while administering the exam and work quickly. Except where indicated, the patient should not be coached (i.e., repeated requests to patient to make a special effort).   1a  Level of consciousness: 0=alert; keenly responsive  1b. LOC questions:  0=Performs both tasks correctly  1c. LOC commands: 0=Performs both tasks correctly  2.  Best Gaze: 0=normal  3.  Visual: 0=No visual loss  4. Facial Palsy: 1=Minor paralysis (flattened nasolabial fold, asymmetric on smiling)  5a.  Motor left arm: 0=No drift, limb holds 90 (or 45) degrees for full 10 seconds  5b.  Motor right arm: 1=Drift, limb holds 90 (or 45) degrees but drifts  down before full 10 seconds: does not hit bed  6a. motor left leg: 0=No drift, limb holds 90 (or 45) degrees for full 10 seconds  6b  Motor right leg:  2=Some effort against gravity, limb cannot get to or maintain (if cured) 90 (or 45) degrees, drifts down to bed, but has some effort against gravity  7. Limb Ataxia: 0=Absent  8.  Sensory: 0=Normal; no sensory loss  9. Best Language:  0=No aphasia, normal  10. Dysarthria: 0=Normal  11. Extinction and Inattention: 0=No abnormality  12. Distal motor function: 0=Normal   Total:   4   ____________________________________________   LABS (all labs ordered are listed, but only abnormal results are displayed)  Labs Reviewed  CBC - Abnormal; Notable for the following:       Result Value   WBC 11.6 (*)    RDW 16.4 (*)    All  other components within normal limits  DIFFERENTIAL - Abnormal; Notable for the following:    Neutro Abs 9.8 (*)    All other components within normal limits  GLUCOSE, CAPILLARY - Abnormal; Notable for the following:    Glucose-Capillary 116 (*)    All other components within normal limits  ETHANOL  PROTIME-INR  APTT  COMPREHENSIVE METABOLIC PANEL  URINALYSIS, ROUTINE W REFLEX MICROSCOPIC  TROPONIN I   ____________________________________________  EKG  ED ECG REPORT I, Doran Stabler, the attending physician, personally viewed and interpreted this ECG.   Date: 03/06/2017  EKG Time: 1127  Rate: 97  Rhythm: normal sinus rhythm  Axis: normal  Intervals:none  ST&T Change: No ST segment elevation or depression. No abnormal T-wave inversion.  ____________________________________________  RADIOLOGY  No acute intracranial abnormalities. ____________________________________________   PROCEDURES  Procedure(s) performed:   Procedures  Critical Care performed:   ____________________________________________   INITIAL IMPRESSION / ASSESSMENT AND PLAN / ED COURSE  Pertinent labs & imaging results  that were available during my care of the patient were reviewed by me and considered in my medical decision making (see chart for details).  Unknown time of onset. Patient not a TPA candidate. Report was that the patient woke up confused. I have to assume that the patient was last seen normal yesterday before going to sleep.    ----------------------------------------- 1:22 PM on 03/06/2017 -----------------------------------------  Patient with signs and symptoms of stroke. CT negative. Will be admitted to the hospital. Signed out to Dr. Vella Kohler.   ____________________________________________   FINAL CLINICAL IMPRESSION(S) / ED DIAGNOSES  CVA.  Confusion     NEW MEDICATIONS STARTED DURING THIS VISIT:  New Prescriptions   No medications on file     Note:  This document was prepared using Dragon voice recognition software and may include unintentional dictation errors.     Orbie Pyo, MD 03/06/17 1322

## 2017-03-06 NOTE — ED Notes (Signed)
Pt placed on bedpan in attempt for urine specimen, pt unable to urinate at this time.  Per pt's son, pt is continent.  Will collect specimen next time pt needs to urinate.

## 2017-03-07 ENCOUNTER — Observation Stay (HOSPITAL_BASED_OUTPATIENT_CLINIC_OR_DEPARTMENT_OTHER)
Admit: 2017-03-07 | Discharge: 2017-03-07 | Disposition: A | Discharge status: Home / Self Care | Payer: Medicare Other | Attending: Internal Medicine | Admitting: Internal Medicine

## 2017-03-07 ENCOUNTER — Observation Stay: Payer: Medicare Other

## 2017-03-07 DIAGNOSIS — R531 Weakness: Secondary | ICD-10-CM | POA: Diagnosis not present

## 2017-03-07 DIAGNOSIS — I6350 Cerebral infarction due to unspecified occlusion or stenosis of unspecified cerebral artery: Secondary | ICD-10-CM | POA: Diagnosis not present

## 2017-03-07 DIAGNOSIS — R2681 Unsteadiness on feet: Secondary | ICD-10-CM | POA: Diagnosis not present

## 2017-03-07 DIAGNOSIS — I639 Cerebral infarction, unspecified: Secondary | ICD-10-CM | POA: Diagnosis not present

## 2017-03-07 DIAGNOSIS — F0390 Unspecified dementia without behavioral disturbance: Secondary | ICD-10-CM | POA: Diagnosis not present

## 2017-03-07 DIAGNOSIS — I6521 Occlusion and stenosis of right carotid artery: Secondary | ICD-10-CM | POA: Diagnosis not present

## 2017-03-07 DIAGNOSIS — I10 Essential (primary) hypertension: Secondary | ICD-10-CM | POA: Diagnosis not present

## 2017-03-07 DIAGNOSIS — I6522 Occlusion and stenosis of left carotid artery: Secondary | ICD-10-CM | POA: Diagnosis not present

## 2017-03-07 DIAGNOSIS — R29810 Facial weakness: Secondary | ICD-10-CM | POA: Diagnosis not present

## 2017-03-07 LAB — URINALYSIS, COMPLETE (UACMP) WITH MICROSCOPIC
BACTERIA UA: NONE SEEN
BILIRUBIN URINE: NEGATIVE
GLUCOSE, UA: NEGATIVE mg/dL
KETONES UR: NEGATIVE mg/dL
NITRITE: NEGATIVE
PH: 5 (ref 5.0–8.0)
PROTEIN: NEGATIVE mg/dL
Specific Gravity, Urine: 1.015 (ref 1.005–1.030)

## 2017-03-07 LAB — LIPID PANEL
CHOL/HDL RATIO: 2.1 ratio
CHOLESTEROL: 105 mg/dL (ref 0–200)
HDL: 51 mg/dL (ref >40)
LDL Cholesterol: 48 mg/dL (ref 0–99)
Triglycerides: 31 mg/dL (ref <150)
VLDL: 6 mg/dL (ref 0–40)

## 2017-03-07 MED ORDER — CLOPIDOGREL BISULFATE 75 MG PO TABS
75.0000 mg | ORAL_TABLET | Freq: Every day | ORAL | Status: DC
  Administered 2017-03-07 – 2017-03-08 (×2): 75 mg via ORAL
  Filled 2017-03-07 (×2): qty 1

## 2017-03-07 NOTE — H&P (Signed)
[facility] NAME: Cathy Johnson    MR#:  [id number]  DATE OF BIRTH:  September 28, 1928  DATE OF ADMISSION:  03/06/2017  PRIMARY CARE PHYSICIAN: Crist Infante, MD   REQUESTING/REFERRING PHYSICIAN: Dr.Shaevitz  CHIEF COMPLAINT: Altered mental status    Chief Complaint  Patient presents with  . Extremity Weakness  . Altered Mental Status    HISTORY OF PRESENT ILLNESS:  Cathy Johnson  is a 83 y.o. female with a known history of Alzheimer's dementia came in from nursing home because of confusion, unable to walk. Patient usually walks without help but yesterday morning and noticed that she is little confused and not able to walk. And staff noticed some facial droop to the right side which I did not notice in the emergency room. Patient history is obtained from ER charts and also talking to her son. Patient has dementia and also she is mumbling.  PAST MEDICAL HISTORY:   Past Medical History:  Diagnosis Date  . Arthritis   . Dementia   . Hypertension   . Osteoarthritis of right knee 05/02/2014  . PFO (patent foramen ovale)    PFO by bubble study 02/05/11 TEE  . Stroke [facility])     PAST SURGICAL HISTOIRY:   Past Surgical History:  Procedure Laterality Date  . BREAST SURGERY     LT BREAST MASS REMOVED  . CARPAL TUNNEL RELEASE    . KNEE ARTHROSCOPY     RIGHT  . PARTIAL KNEE ARTHROPLASTY Right 05/02/2014   Procedure: UNICOMPARTMENTAL KNEE;  Surgeon: Johnny Bridge, MD;  Location: [facility];  Service: Orthopedics;  Laterality: Right;    SOCIAL HISTORY:   Social History  Substance Use Topics  . Smoking status: Never Smoker  . Smokeless tobacco: Never Used  . Alcohol use No    FAMILY HISTORY:  No family history on file.  DRUG ALLERGIES:   Allergies  Allergen Reactions  . Boniva [Ibandronic Acid]   . Crestor [Rosuvastatin Calcium]   . Exelon [Rivastigmine Tartrate]   . Fosamax [Alendronate Sodium]   . Lipitor  [Atorvastatin]   . Morphine And Related   . Vagifem [Estradiol]     REVIEW OF SYSTEMS:  Unable to obtain review of systems because of dementia. According to patient's son has no recent fever, cough, shortness of breath.  MEDICATIONS AT HOME:   Prior to Admission medications   Medication Sig Start Date End Date Taking? Authorizing Provider  acetaminophen (TYLENOL) 500 MG tablet Take 500 mg by mouth every 8 (eight) hours as needed for mild pain.   Yes [provider]  amLODipine (NORVASC) 5 MG tablet Take 5 mg by mouth daily.   Yes [provider]  aspirin 325 MG tablet Take 325 mg by mouth daily.   Yes [provider]  atorvastatin (LIPITOR) 20 MG tablet Take 20 mg by mouth daily at 6 PM.    Yes [provider]  LORazepam (ATIVAN) 0.5 MG tablet Take 0.5 mg by mouth 2 (two) times daily.    Yes [provider]  memantine (NAMENDA) 10 MG tablet Take 10 mg by mouth 2 (two) times daily.   Yes [provider]  risperiDONE (RISPERDAL) 1 MG tablet Take 1 mg by mouth at bedtime.   Yes [provider]  telmisartan (MICARDIS) 40 MG tablet Take 40 mg by mouth daily.    Yes [provider]  vitamin B-12 (CYANOCOBALAMIN) 1000 MCG tablet Take 1,000 mcg by  mouth daily.   Yes [provider]  Vitamin D, Ergocalciferol, (DRISDOL) 50000 units CAPS capsule Take 50,000 Units by mouth every 7 (seven) days.   Yes [provider]      VITAL SIGNS:  Blood pressure 137/78, pulse 98, temperature 98.4 F (36.9 C), temperature source Oral, resp. rate 19, height 5\' 2"  (1.575 m), weight 59.9 kg (132 lb 1.6 oz), SpO2 94 %.  PHYSICAL EXAMINATION:  GENERAL:  83 y.o.-year-old patient lying in the bed with no acute distress. Patient follows commands sometimes EYES: Pupils equal, round, reactive to light  . No scleral icterus. Extraocular muscles intact.  HEENT: Head atraumatic, normocephalic. Oropharynx and nasopharynx clear.   NECK:  Supple, no jugular venous distention. No thyroid enlargement, no tenderness.  LUNGS: Normal breath sounds bilaterally, no wheezing, rales,rhonchi or crepitation. No use of accessory muscles of respiration.  CARDIOVASCULAR: S1, S2 normal. No murmurs, rubs, or gallops.  ABDOMEN: Soft, nontender, nondistended. Bowel sounds present. No organomegaly or mass.  EXTREMITIES: No pedal edema, cyanosis, or clubbing.  NEUROLOGIC: Patient is able to follow direction about muscle strength, visual activity. I don't see a day. Neurological deficit. Cranial no stool 12 intact, power 5 in upper and lower extremities. did not see any facial droop.  PSYCHIATRIC: Patient is mumbling and not oriented to time and  place   SKIN: No obvious rash, lesion, or ulcer.   LABORATORY PANEL:   CBC  Recent Labs Lab 03/06/17 1127  WBC 11.6*  HGB 12.5  HCT 37.3  PLT 295   ------------------------------------------------------------------------------------------------------------------  Chemistries   Recent Labs Lab 03/06/17 1127  NA 139  K 3.6  CL 106  CO2 24  GLUCOSE 129*  BUN 18  CREATININE 0.88  CALCIUM 9.6  AST 26  ALT 19  ALKPHOS 124  BILITOT 0.6   ------------------------------------------------------------------------------------------------------------------  Cardiac Enzymes  Recent Labs Lab 03/06/17 1127  TROPONINI <0.03   ------------------------------------------------------------------------------------------------------------------  RADIOLOGY:  Ct Head Wo Contrast  Result Date: 03/06/2017 CLINICAL DATA:  Pt in via EMS from [facility], called out for possible stroke; per facility, pt with difficulty ambulating upon waking this morning with increased confusion from baseline as well. Pt with weakness to RLE, slight right facial droop, and aphasia upon arrival. EXAM: CT HEAD WITHOUT CONTRAST TECHNIQUE: Contiguous axial images were obtained from the base of the  skull through the vertex without intravenous contrast. COMPARISON:  01/06/2016 FINDINGS: Brain: No evidence of acute infarction, hemorrhage, hydrocephalus, extra-axial collection or mass lesion/mass effect. The ventricles are normal in size for this patient's age. There are small hypoattenuating areas, 1 in the left caudate nucleus head and the other in the anterior right thalamus consistent with old lacune infarcts. Vascular: No hyperdense vessel or unexpected calcification. Skull: Normal. Negative for fracture or focal lesion. Sinuses/Orbits: Visualize globes and orbits are unremarkable. Visualized sinuses and mastoid air cells are clear. Other: None. IMPRESSION: 1. No acute intracranial abnormalities. No evidence of a recent infarct. 2. Age related volume loss.  Small lacune infarcts Electronically Signed   By: Lajean Manes M.D.   On: 03/06/2017 12:05    EKG:   Orders placed or performed during the hospital encounter of 03/06/17  . ED EKG  . ED EKG  . EKG 12-Lead  . EKG 12-Lead  EKG shows normal sinus rhythm at 97 bpm, no ST T changes.  IMPRESSION AND PLAN:   #1. Confusion, ambulatory difficulty. Relatively sudden onset: Initial NIHS stroke scale as documented by ER  Is 4,  but patient has dementia but and unable to follow some commands so I'm not sure if NIHS stroke scale is accurate. Patient admitted to stroke unit for possible stroke initial head CT normal. Tinel aspirin, statins, MRI of the brain on, ultrasound of carotids, echocardiogram ordered. 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 Get  physical therapy. Patient is a full code as person who is HCPOA.   #2 dementia: Continue home medications with Risperdal, Namenda.  #3 possible dehydration, continue gentle hydration  #4 possible UTI: Patient to UA is not showing any evidence of urine infection.  Discussed the plan with patient's son in  detail. He would like to have all the workup for possible stroke, get physical therapy.  Al son was healthcare power of attorneyl the records are reviewed and case discussed with ED provider. Management plans discussed with the patient, family and they are in agreement.  CODE STATUS: full  TOTAL TIME TAKING CARE OF THIS PATIENT: 55 minutes.    Epifanio Lesches M.D on 03/07/2017 at 7:42 AM  Between 7am to 6pm - Pager - [phone]  After 6pm go to [email or web address] - password [email or web address] [facility]  Office  [phone]  CC: Primary care physician; Crist Infante, MD  Note: This dictation was prepared with Dragon dictation along with smaller phrase technology. Any transcriptional errors that result from this process are unintentional.

## 2017-03-07 NOTE — Progress Notes (Signed)
OT Cancellation Note  Patient Details Name: Cathy Johnson MRN: [id number] DOB: July 12, 1928   Cancelled Treatment:    Reason Eval/Treat Not Completed: Patient at procedure or test/ unavailable. Patient out at echo when OT arrived. Will re-attempt evaluation at a later time.  [place], OTR/L 03/07/2017, 10:48 AM

## 2017-03-07 NOTE — Consult Note (Signed)
Referring Physician: Tressia Miners    Chief Complaint: Confusion, inability to walk  HPI: Cathy Johnson is an 83 y.o. female with a history of dementia.  Due to her dementia she is unable to provide any history today.  Family not available therefore all history is obtained from the chart.  Per EMS, the patient reportedly woke up confused and was unable to walk. The patient also told nursing that she felt confused.  Staff felt there was a right sided facial droop.  Initial NIHSS of 4.    Date last known well: Unable to determine Time last known well: Unable to determine tPA Given: No: Unable to determine LKW  Past Medical History:  Diagnosis Date  . Arthritis   . Dementia   . Hypertension   . Osteoarthritis of right knee 05/02/2014  . PFO (patent foramen ovale)    PFO by bubble study 02/05/11 TEE  . Stroke [facility])     Past Surgical History:  Procedure Laterality Date  . BREAST SURGERY     LT BREAST MASS REMOVED  . CARPAL TUNNEL RELEASE    . KNEE ARTHROSCOPY     RIGHT  . PARTIAL KNEE ARTHROPLASTY Right 05/02/2014   Procedure: UNICOMPARTMENTAL KNEE;  Surgeon: Johnny Bridge, MD;  Location: [place];  Service: Orthopedics;  Laterality: Right;    Family history: Unable to obtain due to mental status  Social History:  reports that she has never smoked. She has never used smokeless tobacco. She reports that she does not drink alcohol or use drugs.  Allergies:  Allergies  Allergen Reactions  . Boniva [Ibandronic Acid]   . Crestor [Rosuvastatin Calcium]   . Exelon [Rivastigmine Tartrate]   . Fosamax [Alendronate Sodium]   . Lipitor [Atorvastatin]   . Morphine And Related   . Vagifem [Estradiol]     Medications:  I have reviewed the patient's current medications. Prior to Admission:  Prescriptions Prior to Admission  Medication Sig Dispense Refill Last Dose  . acetaminophen (TYLENOL) 500 MG tablet Take 500 mg by mouth every 8 (eight) hours as needed for mild pain.   prn at prn  .  amLODipine (NORVASC) 5 MG tablet Take 5 mg by mouth daily.   03/06/2017 at 0800  . aspirin 325 MG tablet Take 325 mg by mouth daily.   03/06/2017 at 0800  . atorvastatin (LIPITOR) 20 MG tablet Take 20 mg by mouth daily at 6 PM.    03/05/2017 at 1900  . LORazepam (ATIVAN) 0.5 MG tablet Take 0.5 mg by mouth 2 (two) times daily.    03/06/2017 at 0800  . memantine (NAMENDA) 10 MG tablet Take 10 mg by mouth 2 (two) times daily.   03/06/2017 at 0800  . risperiDONE (RISPERDAL) 1 MG tablet Take 1 mg by mouth at bedtime.   03/05/2017 at 1900  . telmisartan (MICARDIS) 40 MG tablet Take 40 mg by mouth daily.    03/06/2017 at 0800  . vitamin B-12 (CYANOCOBALAMIN) 1000 MCG tablet Take 1,000 mcg by mouth daily.   03/06/2017 at 0900  . Vitamin D, Ergocalciferol, (DRISDOL) 50000 units CAPS capsule Take 50,000 Units by mouth every 7 (seven) days.   03/02/2017   Scheduled: . amLODipine  5 mg Oral Daily  . aspirin EC  325 mg Oral Daily  . atorvastatin  20 mg Oral q1800  . clopidogrel  75 mg Oral Daily  . enoxaparin (LOVENOX) injection  40 mg Subcutaneous Q24H  . irbesartan  37.5 mg Oral Daily  .  LORazepam  0.5 mg Oral BID  . memantine  10 mg Oral BID  . risperiDONE  1 mg Oral QHS  . vitamin B-12  1,000 mcg Oral Daily  . [START ON 03/09/2017] Vitamin D (Ergocalciferol)  50,000 Units Oral Q7 days    ROS: History obtained from chart review  General ROS: negative for - chills, fatigue, fever, night sweats, weight gain or weight loss Psychological ROS: memory difficulties Ophthalmic ROS: negative for - blurry vision, double vision, eye pain or loss of vision ENT ROS: negative for - epistaxis, nasal discharge, oral lesions, sore throat, tinnitus or vertigo Allergy and Immunology ROS: negative for - hives or itchy/watery eyes Hematological and Lymphatic ROS: negative for - bleeding problems, bruising or swollen lymph nodes Endocrine ROS: negative for - galactorrhea, hair pattern changes, polydipsia/polyuria or  temperature intolerance Respiratory ROS: negative for - cough, hemoptysis, shortness of breath or wheezing Cardiovascular ROS: negative for - chest pain, dyspnea on exertion, edema or irregular heartbeat Gastrointestinal ROS: negative for - abdominal pain, diarrhea, hematemesis, nausea/vomiting or stool incontinence Genito-Urinary ROS: negative for - dysuria, hematuria, incontinence or urinary frequency/urgency Musculoskeletal ROS: negative for - joint swelling or muscular weakness Neurological ROS: as noted in HPI Dermatological ROS: negative for rash and skin lesion changes  Physical Examination: Blood pressure (!) 141/60, pulse (!) 118, temperature 98.6 F (37 C), temperature source Oral, resp. rate 20, height 5\' 2"  (1.575 m), weight 59.9 kg (132 lb 1.6 oz), SpO2 94 %.  HEENT-  Normocephalic, no lesions, without obvious abnormality.  Normal external eye and conjunctiva.  Normal TM's bilaterally.  Normal auditory canals and external ears. Normal external nose, mucus membranes and septum.  Normal pharynx. Cardiovascular- S1, S2 normal, pulses palpable throughout   Lungs- chest clear, no wheezing, rales, normal symmetric air entry Abdomen- soft, non-tender; bowel sounds normal; no masses,  no organomegaly Extremities- no edema Lymph-no adenopathy palpable Musculoskeletal-no joint tenderness, deformity or swelling Skin-warm and dry, no hyperpigmentation, vitiligo, or suspicious lesions  Neurological Examination   Mental Status: Alert, only oriented to name.  Speech fluent but slurred.  Able to follow some simple commands without difficulty.  Other simple commands unable to be performed. Cranial Nerves: II: Discs flat bilaterally; Blinks to bilateral confrontation III,IV, VI: ptosis not present, extra-ocular motions intact bilaterally V,VII: mild right facial droop, facial light touch sensation normal bilaterally VIII: hearing normal bilaterally IX,X: gag reflex present XI: bilateral  shoulder shrug XII: midline tongue extension Motor: Patient lifts all extremities against gravity.  No distinct focal weakness noted.   Tone and bulk:normal tone throughout; no atrophy noted Sensory: Pinprick and light touch intact throughout, bilaterally Deep Tendon Reflexes: 2+in the upper extremities, absent at the knees and 2+ at the ankles Plantars: Right: downgoing   Left: downgoing Cerebellar: Normal finger-to-nose and heel to shin testing bilaterally Gait: not tested due to safety concerns   Laboratory Studies:  Basic Metabolic Panel:  Recent Labs Lab 03/06/17 1127  NA 139  K 3.6  CL 106  CO2 24  GLUCOSE 129*  BUN 18  CREATININE 0.88  CALCIUM 9.6    Liver Function Tests:  Recent Labs Lab 03/06/17 1127  AST 26  ALT 19  ALKPHOS 124  BILITOT 0.6  PROT 7.6  ALBUMIN 4.3   No results for input(s): LIPASE, AMYLASE in the last 168 hours. No results for input(s): AMMONIA in the last 168 hours.  CBC:  Recent Labs Lab 03/06/17 1127  WBC 11.6*  NEUTROABS 9.8*  HGB 12.5  HCT 37.3  MCV 85.1  PLT 295    Cardiac Enzymes:  Recent Labs Lab 03/06/17 1127  TROPONINI <0.03    BNP: Invalid input(s): POCBNP  CBG:  Recent Labs Lab 03/06/17 1134  GLUCAP 116*    Microbiology: Results for orders placed or performed during the hospital encounter of 04/25/14  Surgical pcr screen     Status: None   Collection Time: 04/25/14 12:43 PM  Result Value Ref Range Status   MRSA, PCR NEGATIVE NEGATIVE Final   Staphylococcus aureus NEGATIVE NEGATIVE Final    Comment:        The Xpert SA Assay (FDA approved for NASAL specimens in patients over 51 years of age), is one component of a comprehensive surveillance program.  Test performance has been validated by [employer] for patients greater than or equal to 27 year old. It is not intended to diagnose infection nor to guide or monitor treatment.    Coagulation Studies:  Recent Labs  03/06/17 1127   LABPROT 14.0  INR 1.08    Urinalysis:  Recent Labs Lab 03/07/17 1110  COLORURINE YELLOW*  LABSPEC 1.015  PHURINE 5.0  GLUCOSEU NEGATIVE  HGBUR SMALL*  BILIRUBINUR NEGATIVE  KETONESUR NEGATIVE  PROTEINUR NEGATIVE  NITRITE NEGATIVE  LEUKOCYTESUR TRACE*    Lipid Panel:    Component Value Date/Time   CHOL 105 03/07/2017 0508   TRIG 31 03/07/2017 0508   HDL 51 03/07/2017 0508   CHOLHDL 2.1 03/07/2017 0508   VLDL 6 03/07/2017 0508   LDLCALC 48 03/07/2017 0508    HgbA1C:  Lab Results  Component Value Date   HGBA1C (H) 02/02/2011    5.9 (NOTE)                                                                       According to the ADA Clinical Practice Recommendations for 2011, when HbA1c is used as a screening test:   >=6.5%   Diagnostic of Diabetes Mellitus           (if abnormal result  is confirmed)  5.7-6.4%   Increased risk of developing Diabetes Mellitus  References:Diagnosis and Classification of Diabetes Mellitus,Diabetes YCXK,4818,56(DJSHF 1):S62-S69 and Standards of Medical Care in         Diabetes - 2011,Diabetes Care,2011,34  (Suppl 1):S11-S61.    Urine Drug Screen:  No results found for: LABOPIA, COCAINSCRNUR, LABBENZ, AMPHETMU, THCU, LABBARB  Alcohol Level:  Recent Labs Lab 03/06/17 [place] <5    Other results: EKG: sinus rhythm at 97 bpm.  PVC's.  Imaging: Ct Head Wo Contrast  Result Date: 03/06/2017 CLINICAL DATA:  Pt in via EMS from [facility], called out for possible stroke; per facility, pt with difficulty ambulating upon waking this morning with increased confusion from baseline as well. Pt with weakness to RLE, slight right facial droop, and aphasia upon arrival. EXAM: CT HEAD WITHOUT CONTRAST TECHNIQUE: Contiguous axial images were obtained from the base of the skull through the vertex without intravenous contrast. COMPARISON:  01/06/2016 FINDINGS: Brain: No evidence of acute infarction, hemorrhage, hydrocephalus, extra-axial  collection or mass lesion/mass effect. The ventricles are normal in size for this patient's age. There are small hypoattenuating areas, 1 in the left caudate nucleus  head and the other in the anterior right thalamus consistent with old lacune infarcts. Vascular: No hyperdense vessel or unexpected calcification. Skull: Normal. Negative for fracture or focal lesion. Sinuses/Orbits: Visualize globes and orbits are unremarkable. Visualized sinuses and mastoid air cells are clear. Other: None. IMPRESSION: 1. No acute intracranial abnormalities. No evidence of a recent infarct. 2. Age related volume loss.  Small lacune infarcts Electronically Signed   By: Lajean Manes M.D.   On: 03/06/2017 12:05   Mr Brain Wo Contrast  Result Date: 03/07/2017 CLINICAL DATA:  Personal history of bowel center is dementia with new onset increased confusion and inability to walk. Right-sided facial droop. EXAM: MRI HEAD WITHOUT CONTRAST MRA HEAD WITHOUT CONTRAST TECHNIQUE: Multiplanar, multiecho pulse sequences of the brain and surrounding structures were obtained without intravenous contrast. Angiographic images of the head were obtained using MRA technique without contrast. COMPARISON:  CT head without contrast 03/06/2017. MRI the brain 02/02/2011 FINDINGS: MRI HEAD FINDINGS Brain: The diffusion-weighted images demonstrate no acute or subacute infarction. No acute hemorrhage or mass lesion is present. Periventricular and subcortical T2 hyperintensities bilaterally are similar to the prior studies. Moderate generalized atrophy is stable. A remote left frontal cortical infarct is noted. A remote lacunar infarct is present within the right internal capsule. Brainstem is within normal limits. Remote infarcts are present in the cerebellum bilaterally, right greater than left. The ventricles are proportionate to the degree of atrophy. No significant extra-axial fluid collection is present. Vascular: Flow is present in the major intracranial  arteries. Skull and upper cervical spine: The skullbase is within normal limits. Craniocervical junction is normal. Degenerative changes are noted in the upper cervical spine. Sinuses/Orbits: The paranasal sinuses are clear. There is some fluid in the inferior mastoid air cells bilaterally. No obstructing nasopharyngeal lesion is present. Bilateral lens replacements are present. Globes and orbits are otherwise within normal limits. MRA HEAD FINDINGS The study is mildly degraded by patient motion. Atherosclerotic changes are present within the cavernous internal carotid artery bilaterally without significant stenosis through the ICA termini. The A1 and M1 segments are normal. The left A1 segment is dominant. There is signal loss at the MCA bifurcations bilaterally, artifactual. There is some attenuation of distal ACA and MCA branch vessels bilaterally. The left vertebral artery is the dominant vessel. There is significant signal loss in the distal vertebral arteries proximal basilar artery. Basilar artery is small, terminating at the superior cerebellar arteries. Fetal type posterior cerebral arteries are present bilaterally with attenuation of distal PCA branch vessels. IMPRESSION: 1. No acute intracranial abnormality. 2. Stable moderate atrophy white matter disease. 3. Remote lacunar infarcts in the right internal capsule and bilateral cerebellum. 4. Mild diffuse small vessel disease evident on the MRA without a significant proximal stenosis, aneurysm, or branch vessel occlusion. 5. Signal loss in the distal vertebral arteries and the MCA bifurcation bilaterally is felt to be artifactual. Electronically Signed   By: San Morelle M.D.   On: 03/07/2017 10:46   US Carotid Bilateral (at [facility] Only)  Result Date: 03/07/2017 CLINICAL DATA:  Facial droop EXAM: BILATERAL CAROTID DUPLEX ULTRASOUND TECHNIQUE: Pearline Cables scale imaging, color Doppler and duplex ultrasound were performed of bilateral carotid and  vertebral arteries in the neck. COMPARISON:  None. FINDINGS: Criteria: Quantification of carotid stenosis is based on velocity parameters that correlate the residual internal carotid diameter with NASCET-based stenosis levels, using the diameter of the distal internal carotid lumen as the denominator for stenosis measurement. The following velocity measurements were obtained:  RIGHT ICA:  76 cm/sec CCA:  810 cm/sec SYSTOLIC ICA/CCA RATIO:  0.7 DIASTOLIC ICA/CCA RATIO:  0.8 ECA:  149 cm/sec LEFT ICA:  85 cm/sec CCA:  175 cm/sec SYSTOLIC ICA/CCA RATIO:  0.8 DIASTOLIC ICA/CCA RATIO:  1.3 ECA:  113 cm/sec RIGHT CAROTID ARTERY: There is mild scattered mixed plaque in the bulb. Low resistance internal carotid Doppler pattern is preserved. There is calcified plaque at the origin of the right external carotid. Tachycardia is noted. RIGHT VERTEBRAL ARTERY:  Antegrade. LEFT CAROTID ARTERY: Mild calcified plaque in the left bulb. Low resistance internal carotid Doppler pattern is preserved. Mild calcified plaque at the origin of the external carotid for LEFT VERTEBRAL ARTERY:  Antegrade. IMPRESSION: Less than 50% stenosis in the right and left internal carotid arteries. Electronically Signed   By: Marybelle Killings M.D.   On: 03/07/2017 09:58   Mr Jodene Nam Head/brain ZW Cm  Result Date: 03/07/2017 CLINICAL DATA:  Personal history of bowel center is dementia with new onset increased confusion and inability to walk. Right-sided facial droop. EXAM: MRI HEAD WITHOUT CONTRAST MRA HEAD WITHOUT CONTRAST TECHNIQUE: Multiplanar, multiecho pulse sequences of the brain and surrounding structures were obtained without intravenous contrast. Angiographic images of the head were obtained using MRA technique without contrast. COMPARISON:  CT head without contrast 03/06/2017. MRI the brain 02/02/2011 FINDINGS: MRI HEAD FINDINGS Brain: The diffusion-weighted images demonstrate no acute or subacute infarction. No acute hemorrhage or mass lesion is  present. Periventricular and subcortical T2 hyperintensities bilaterally are similar to the prior studies. Moderate generalized atrophy is stable. A remote left frontal cortical infarct is noted. A remote lacunar infarct is present within the right internal capsule. Brainstem is within normal limits. Remote infarcts are present in the cerebellum bilaterally, right greater than left. The ventricles are proportionate to the degree of atrophy. No significant extra-axial fluid collection is present. Vascular: Flow is present in the major intracranial arteries. Skull and upper cervical spine: The skullbase is within normal limits. Craniocervical junction is normal. Degenerative changes are noted in the upper cervical spine. Sinuses/Orbits: The paranasal sinuses are clear. There is some fluid in the inferior mastoid air cells bilaterally. No obstructing nasopharyngeal lesion is present. Bilateral lens replacements are present. Globes and orbits are otherwise within normal limits. MRA HEAD FINDINGS The study is mildly degraded by patient motion. Atherosclerotic changes are present within the cavernous internal carotid artery bilaterally without significant stenosis through the ICA termini. The A1 and M1 segments are normal. The left A1 segment is dominant. There is signal loss at the MCA bifurcations bilaterally, artifactual. There is some attenuation of distal ACA and MCA branch vessels bilaterally. The left vertebral artery is the dominant vessel. There is significant signal loss in the distal vertebral arteries proximal basilar artery. Basilar artery is small, terminating at the superior cerebellar arteries. Fetal type posterior cerebral arteries are present bilaterally with attenuation of distal PCA branch vessels. IMPRESSION: 1. No acute intracranial abnormality. 2. Stable moderate atrophy white matter disease. 3. Remote lacunar infarcts in the right internal capsule and bilateral cerebellum. 4. Mild diffuse small  vessel disease evident on the MRA without a significant proximal stenosis, aneurysm, or branch vessel occlusion. 5. Signal loss in the distal vertebral arteries and the MCA bifurcation bilaterally is felt to be artifactual. Electronically Signed   By: San Morelle M.D.   On: 03/07/2017 10:46    Assessment: 83 y.o. female presenting with increased confusion, difficulty with gait and facial droop.  Facial droop remains and patient  less ambulatory. MRI of the brain reviewed and shows no acute changes.  Carotid dopplers show no evidence of hemodynamically significant stenosis.  Echocardiogram shows no cardiac source of emboli with an EF of 65-70%.  A1c pending, LDL 48.  Patient on ASA at home.  With no evidence of acute infarct patient may be having progression of her dementing illness.     Stroke Risk Factors - hypertension and PFO  Plan: 1. PT consult, OT consult, Speech consult 2. Prophylactic therapy-Continue ASA 3. Telemetry monitoring 4. Frequent neuro checks   Alexis Goodell, MD Neurology [phone] 03/07/2017, 4:56 PM

## 2017-03-07 NOTE — Evaluation (Signed)
Physical Therapy Evaluation Patient Details Name: Cathy Johnson MRN: [id number] DOB: 27-Feb-1928 Today's Date: 03/07/2017   History of Present Illness  83 y.o. female with a known history of Alzheimer's dementia came in from nursing home because of confusion, unable to walk (apparently normally walks w/o assist at baseline).  She had noted facial droop, minimal at time of PT exam, CT negative for acute infarct.  Clinical Impression  Pt is able to participate with PT but given her confusion/dementia it was difficult to do a lot and really gather full information. She did stand and take a few steps over to the recliner, but is seems she is far from her functional baseline and likely would benefit from short term rehab to try to increase mobility and function.      Follow Up Recommendations SNF    Equipment Recommendations       Recommendations for Other Services       Precautions / Restrictions Precautions Precautions: Fall Restrictions Weight Bearing Restrictions: No      Mobility  Bed Mobility Overal bed mobility: Needs Assistance Bed Mobility: Supine to Sit     Supine to sit: Min assist     General bed mobility comments: Pt able to attempt to getting to sitting, did need assist  Transfers Overall transfer level: Needs assistance Equipment used: Rolling walker (2 wheeled) Transfers: Sit to/from Stand Sit to Stand: Mod assist         General transfer comment: Pt again able to show some effort, needed direct assist to get to standing  Ambulation/Gait Ambulation/Gait assistance: Min assist;Mod assist Ambulation Distance (Feet): 3 Feet Assistive device: Rolling walker (2 wheeled)       General Gait Details: Pt able to take a few small shuffling steps turning to the side to get to recliner.  Pt needed some assist t/o the effort but was able to keep herself upright and did not have any overt LOBs.  Stairs            Wheelchair Mobility    Modified Rankin  (Stroke Patients Only)       Balance Overall balance assessment: Needs assistance Sitting-balance support: Bilateral upper extremity supported Sitting balance-Leahy Scale: Fair Sitting balance - Comments: Once positioned pt was able to maintain sitting w/o direct assist   Standing balance support: Bilateral upper extremity supported Standing balance-Leahy Scale: Poor Standing balance comment: Pt's confusion is the biggest issue with her balance/safety - needed occasional direct assist with stepping, but generally able to stay upright with close guarding and heavy cuing                             Pertinent Vitals/Pain Pain Assessment:  (does not report pain when aske, does not indicate pain)    Home Living Family/patient expects to be discharged to:: Assisted living               Home Equipment:  (pt unable to report, seems she uses cane or walker?)      Prior Function           Comments: Pt unable to report, apparently she could walk around [place]        Extremity/Trunk Assessment   Upper Extremity Assessment Upper Extremity Assessment: Difficult to assess due to impaired cognition    Lower Extremity Assessment Lower Extremity Assessment: Difficult to assess due to impaired cognition  Communication   Communication: Expressive difficulties;Receptive difficulties  Cognition Arousal/Alertness:  (confused) Behavior During Therapy: Restless Overall Cognitive Status: Difficult to assess (h/o dementia)                                        General Comments      Exercises     Assessment/Plan    PT Assessment Patient needs continued PT services  PT Problem List Decreased strength;Decreased range of motion;Decreased activity tolerance;Decreased balance;Decreased mobility;Decreased safety awareness;Decreased knowledge of use of DME;Decreased cognition       PT Treatment Interventions DME  instruction;Gait training;Stair training;Functional mobility training;Therapeutic activities;Therapeutic exercise;Balance training;Neuromuscular re-education;Patient/family education;Cognitive remediation    PT Goals (Current goals can be found in the Care Plan section)  Acute Rehab PT Goals Patient Stated Goal: unable to report PT Goal Formulation: Patient unable to participate in goal setting Time For Goal Achievement: 03/19/17 Potential to Achieve Goals: Fair    Frequency Min 2X/week   Barriers to discharge        Co-evaluation               AM-PAC PT "6 Clicks" Daily Activity  Outcome Measure Difficulty turning over in bed (including adjusting bedclothes, sheets and blankets)?: Total Difficulty moving from lying on back to sitting on the side of the bed? : Total Difficulty sitting down on and standing up from a chair with arms (e.g., wheelchair, bedside commode, etc,.)?: Total Help needed moving to and from a bed to chair (including a wheelchair)?: A Lot Help needed walking in hospital room?: Total Help needed climbing 3-5 steps with a railing? : Total 6 Click Score: 7    End of Session Equipment Utilized During Treatment: Gait belt Activity Tolerance:  (limited secondary to mental status) Patient left: with chair alarm set;with call bell/phone within reach   PT Visit Diagnosis: Muscle weakness (generalized) (M62.81);Difficulty in walking, not elsewhere classified (R26.2)    Time: 0962-8366 PT Time Calculation (min) (ACUTE ONLY): 20 min   Charges:   PT Evaluation $PT Eval Low Complexity: 1 Procedure     PT G Codes:   PT G-Codes **NOT FOR INPATIENT CLASS** Functional Assessment Tool Used: AM-PAC 6 Clicks Basic Mobility Functional Limitation: Mobility: Walking and moving around Mobility: Walking and Moving Around Current Status (Q9476): At least 80 percent but less than 100 percent impaired, limited or restricted Mobility: Walking and Moving Around Goal Status  [phone]): At least 20 percent but less than 40 percent impaired, limited or restricted    Kreg Shropshire, DPT 03/07/2017, 3:51 PM

## 2017-03-07 NOTE — Progress Notes (Signed)
[place] at [place] NAME: Cathy Johnson    MR#:  [id number]  DATE OF BIRTH:  [birth year]  SUBJECTIVE:  CHIEF COMPLAINT:   Chief Complaint  Patient presents with  . Extremity Weakness  . Altered Mental Status   -Noticeable right facial droop and slurred speech. -Stroke workup pending today. Has a personal caregiver at bedside  REVIEW OF SYSTEMS:  Review of Systems  Unable to perform ROS: Dementia    DRUG ALLERGIES:   Allergies  Allergen Reactions  . Boniva [Ibandronic Acid]   . Crestor [Rosuvastatin Calcium]   . Exelon [Rivastigmine Tartrate]   . Fosamax [Alendronate Sodium]   . Lipitor [Atorvastatin]   . Morphine And Related   . Vagifem [Estradiol]     VITALS:  Blood pressure 137/78, pulse 98, temperature 98.4 F (36.9 C), temperature source Oral, resp. rate 19, height 5\' 2"  (1.575 m), weight 59.9 kg (132 lb 1.6 oz), SpO2 94 %.  PHYSICAL EXAMINATION:  Physical Exam  GENERAL:  83 y.o.-year-old Elderly patient lying in the bed with no acute distress.  EYES: Pupils equal, round, reactive to light and accommodation. No scleral icterus. Extraocular muscles intact.  HEENT: Head atraumatic, normocephalic. Right facial droop noted. Oropharynx and nasopharynx clear.  NECK:  Supple, no jugular venous distention. No thyroid enlargement, no tenderness.  LUNGS: Normal breath sounds bilaterally, no wheezing, rales,rhonchi or crepitation. No use of accessory muscles of respiration. Decreased bibasilar breath sounds CARDIOVASCULAR: S1, S2 normal. No rubs, or gallops. 3/6 systolic murmur is present ABDOMEN: Soft, nontender, nondistended. Bowel sounds present. No organomegaly or mass.  EXTREMITIES: No pedal edema, cyanosis, or clubbing.  NEUROLOGIC: Cranial nerves II through XII are intact. Right facial droop noted. Following simple commands. Upper extremity strength is 5/5 in both upper extremities, bilateral lower extremity strength is  2/5 due to generalized weakness. Sensation is intact. Gait is not tested. PSYCHIATRIC: The patient is alert but disoriented due to her dementia  SKIN: No obvious rash, lesion, or ulcer.    LABORATORY PANEL:   CBC  Recent Labs Lab 03/06/17 1127  WBC 11.6*  HGB 12.5  HCT 37.3  PLT 295   ------------------------------------------------------------------------------------------------------------------  Chemistries   Recent Labs Lab 03/06/17 1127  NA 139  K 3.6  CL 106  CO2 24  GLUCOSE 129*  BUN 18  CREATININE 0.88  CALCIUM 9.6  AST 26  ALT 19  ALKPHOS 124  BILITOT 0.6   ------------------------------------------------------------------------------------------------------------------  Cardiac Enzymes  Recent Labs Lab 03/06/17 1127  TROPONINI <0.03   ------------------------------------------------------------------------------------------------------------------  RADIOLOGY:  Ct Head Wo Contrast  Result Date: 03/06/2017 CLINICAL DATA:  Pt in via EMS from [facility], called out for possible stroke; per facility, pt with difficulty ambulating upon waking this morning with increased confusion from baseline as well. Pt with weakness to RLE, slight right facial droop, and aphasia upon arrival. EXAM: CT HEAD WITHOUT CONTRAST TECHNIQUE: Contiguous axial images were obtained from the base of the skull through the vertex without intravenous contrast. COMPARISON:  01/06/2016 FINDINGS: Brain: No evidence of acute infarction, hemorrhage, hydrocephalus, extra-axial collection or mass lesion/mass effect. The ventricles are normal in size for this patient's age. There are small hypoattenuating areas, 1 in the left caudate nucleus head and the other in the anterior right thalamus consistent with old lacune infarcts. Vascular: No hyperdense vessel or unexpected calcification. Skull: Normal. Negative for fracture or focal lesion. Sinuses/Orbits: Visualize globes and orbits are  unremarkable. Visualized sinuses and  mastoid air cells are clear. Other: None. IMPRESSION: 1. No acute intracranial abnormalities. No evidence of a recent infarct. 2. Age related volume loss.  Small lacune infarcts Electronically Signed   By: Lajean Manes M.D.   On: 03/06/2017 12:05    EKG:   Orders placed or performed during the hospital encounter of 03/06/17  . ED EKG  . ED EKG  . EKG 12-Lead  . EKG 12-Lead    ASSESSMENT AND PLAN:   83 year old female with past medical history significant for Alzheimer's dementia, hypertension, arthritis presents to hospital from [facility] memory unit secondary to right facial droop.  #1 CVA-with right facial droop. -Admitted, no checks. -MRI of the brain, MRA, carotid Dopplers and echo with bubble study. Known history of PFO in the past. -Monitor on telemetry -physical therapy, speech therapy and occupational therapy consults. -Continue aspirin and statin at this time. We will add Plavix. -Neurology consult pending.  #2 dementia-continue risperidone and Namenda. Has a caregiver at bedside.  #3 hypertension-on Norvasc and Avapro  #4 DVT prophylaxis-on Lovenox     All the records are reviewed and case discussed with Care Management/Social Workerr. Management plans discussed with the patient, family and they are in agreement.  CODE STATUS: Full code  TOTAL TIME TAKING CARE OF THIS PATIENT: 38 minutes.   POSSIBLE D/C IN 1-2 DAYS, DEPENDING ON CLINICAL CONDITION.   Gladstone Lighter M.D on 03/07/2017 at 8:24 AM  Between 7am to 6pm - Pager - [phone]  After 6pm go to [email or web address] - Proofreader  [facility]  Office  [phone]  CC: Primary care physician; Crist Infante, MD

## 2017-03-07 NOTE — Progress Notes (Signed)
SLP Cancellation Note  Patient Details Name: Cathy Johnson MRN: [id number] DOB: 1928-01-14   Cancelled treatment:       Reason Eval/Treat Not Completed: Patient at procedure or test/unavailable (attempted x2; chart reviewed; consulted NSG). NSG reported toleration of po intake including pills w/ water; no overt s/s of aspiration noted. Pt does have a baseline of Dementia which could impact her cognitive linguistic skills at this time of acute illness. ST services will f/u w/ assessment as indicated while admitted. NSG to monitor pt during meals as needed. Recommend general aspiration precautions.     Orinda Kenner, [place], CCC-SLP Cathyann Kilfoyle 03/07/2017, 11:25 AM

## 2017-03-07 NOTE — Progress Notes (Signed)
*  PRELIMINARY RESULTS* Echocardiogram 2D Echocardiogram has been performed. UNABLE TO PERFORM A BUBBLE STUDY ON THIS EXAM, PATIENT VERY CONFUSED & UNCOOPERATIVE  Cathy Johnson 03/07/2017, 11:10 AM

## 2017-03-08 DIAGNOSIS — I10 Essential (primary) hypertension: Secondary | ICD-10-CM | POA: Diagnosis not present

## 2017-03-08 DIAGNOSIS — F0390 Unspecified dementia without behavioral disturbance: Secondary | ICD-10-CM | POA: Diagnosis not present

## 2017-03-08 DIAGNOSIS — E876 Hypokalemia: Secondary | ICD-10-CM | POA: Diagnosis not present

## 2017-03-08 DIAGNOSIS — G459 Transient cerebral ischemic attack, unspecified: Secondary | ICD-10-CM | POA: Diagnosis not present

## 2017-03-08 DIAGNOSIS — I639 Cerebral infarction, unspecified: Secondary | ICD-10-CM | POA: Diagnosis not present

## 2017-03-08 LAB — BASIC METABOLIC PANEL
ANION GAP: 5 (ref 5–15)
BUN: 16 mg/dL (ref 6–20)
CO2: 25 mmol/L (ref 22–32)
CREATININE: 0.78 mg/dL (ref 0.44–1.00)
Calcium: 8.6 mg/dL — ABNORMAL LOW (ref 8.9–10.3)
Chloride: 108 mmol/L (ref 101–111)
GFR calc Af Amer: >60 mL/min (ref >60)
GFR calc non Af Amer: >60 mL/min (ref >60)
GLUCOSE: 109 mg/dL — AB (ref 65–99)
Potassium: 3.1 mmol/L — ABNORMAL LOW (ref 3.5–5.1)
Sodium: 138 mmol/L (ref 135–145)

## 2017-03-08 LAB — HEMOGLOBIN A1C
HEMOGLOBIN A1C: 5.7 % — AB (ref 4.8–5.6)
Mean Plasma Glucose: 117 mg/dL

## 2017-03-08 MED ORDER — CLOPIDOGREL BISULFATE 75 MG PO TABS: 75.0000 mg | ORAL_TABLET | Freq: Every day | ORAL | 1 refills | Status: DC

## 2017-03-08 MED ORDER — SODIUM CHLORIDE 0.9% FLUSH
3.0000 mL | Freq: Two times a day (BID) | INTRAVENOUS | Status: DC
  Administered 2017-03-08: 09:00:00 3 mL via INTRAVENOUS

## 2017-03-08 MED ORDER — LORAZEPAM 0.5 MG PO TABS: 0.5000 mg | ORAL_TABLET | Freq: Two times a day (BID) | ORAL | 0 refills | Status: DC

## 2017-03-08 MED ORDER — POTASSIUM CHLORIDE CRYS ER 20 MEQ PO TBCR
40.0000 meq | EXTENDED_RELEASE_TABLET | Freq: Once | ORAL | Status: AC
  Administered 2017-03-08: 12:00:00 40 meq via ORAL
  Filled 2017-03-08: qty 2

## 2017-03-08 NOTE — Discharge Summary (Signed)
[place] at [place] NAME: Cathy Johnson    MR#:  [id number]  DATE OF BIRTH:  1927/12/09  DATE OF ADMISSION:  03/06/2017   ADMITTING PHYSICIAN: Epifanio Lesches, MD  DATE OF DISCHARGE: 03/08/2017  PRIMARY CARE PHYSICIAN: Crist Infante, MD   ADMISSION DIAGNOSIS:   Facial droop [R29.810] Altered mental status, unspecified altered mental status type [R41.82] Cerebrovascular accident (CVA), unspecified mechanism ([place]) [I63.9]  DISCHARGE DIAGNOSIS:   Active Problems:   Facial droop   SECONDARY DIAGNOSIS:   Past Medical History:  Diagnosis Date  . Arthritis   . Dementia   . Hypertension   . Osteoarthritis of right knee 05/02/2014  . PFO (patent foramen ovale)    PFO by bubble study 02/05/11 TEE  . Stroke [facility])     HOSPITAL COURSE:   83 year old female with past medical history significant for Alzheimer's dementia, hypertension, arthritis presents to hospital from [facility] memory unit secondary to right facial droop.  #1TIA-with right facial droop. -Admitted, no checks. -MRI of the brain  MRA Showing no new infarcts, has chronic microvascular disease, old infarcts. - carotid Dopplers with no hemodynamically significant stenosis. Echocardiogram done from last year showing no PFO. Normal ejection fraction. - PT consult recommended SNIF as patient is needing 1-2% assistance. Discussed with family and also Education officer, museum, plan to discharge back to [facility] at this time. Plans to pursue long-term care from there. We'll discharge with home health.. -Continue aspirin and statin at this time. We will add Plavix due to several TIA symptoms lately. -Neurology consult appreciated.  #2 dementia-continue risperidone and Namenda. Recent worsening of dementia noted. Explained the prognosis to the son.  #3 hypertension-on Norvasc and telmisartan   Patient will be discharged to [facility] today. Discussed CODE STATUS with son  again. He wants her to be a full code.  DISCHARGE CONDITIONS:   Guarded  CONSULTS OBTAINED:   Treatment Team:  Alexis Goodell, MD Epifanio Lesches, MD  DRUG ALLERGIES:   Allergies  Allergen Reactions  . Boniva [Ibandronic Acid]   . Crestor [Rosuvastatin Calcium]   . Exelon [Rivastigmine Tartrate]   . Fosamax [Alendronate Sodium]   . Lipitor [Atorvastatin]   . Morphine And Related   . Vagifem [Estradiol]    DISCHARGE MEDICATIONS:   Allergies as of 03/08/2017      Reactions   Boniva [ibandronic Acid]    Crestor [rosuvastatin Calcium]    Exelon [rivastigmine Tartrate]    Fosamax [alendronate Sodium]    Lipitor [atorvastatin]    Morphine And Related    Vagifem [estradiol]       Medication List    TAKE these medications   acetaminophen 500 MG tablet Commonly known as:  TYLENOL Take 500 mg by mouth every 8 (eight) hours as needed for mild pain.   amLODipine 5 MG tablet Commonly known as:  NORVASC Take 5 mg by mouth daily.   aspirin 325 MG tablet Take 325 mg by mouth daily.   atorvastatin 20 MG tablet Commonly known as:  LIPITOR Take 20 mg by mouth daily at 6 PM.   clopidogrel 75 MG tablet Commonly known as:  PLAVIX Take 1 tablet (75 mg total) by mouth daily. Start taking on:  03/09/2017   LORazepam 0.5 MG tablet Commonly known as:  ATIVAN Take 1 tablet (0.5 mg total) by mouth 2 (two) times daily.   memantine 10 MG tablet Commonly known as:  NAMENDA Take 10 mg by mouth 2 (two)  times daily.   risperiDONE 1 MG tablet Commonly known as:  RISPERDAL Take 1 mg by mouth at bedtime.   telmisartan 40 MG tablet Commonly known as:  MICARDIS Take 40 mg by mouth daily.   vitamin B-12 1000 MCG tablet Commonly known as:  CYANOCOBALAMIN Take 1,000 mcg by mouth daily.   Vitamin D (Ergocalciferol) 50000 units Caps capsule Commonly known as:  DRISDOL Take 50,000 Units by mouth every 7 (seven) days.        DISCHARGE INSTRUCTIONS:   1. PCP f/u in 1-2  weeks 2. Palliative care follow up  DIET:   Cardiac diet  ACTIVITY:   Activity as tolerated  OXYGEN:   Home Oxygen: No.  Oxygen Delivery: room air  DISCHARGE LOCATION:   Assisted Living- [facility];   If you experience worsening of your admission symptoms, develop shortness of breath, life threatening emergency, suicidal or homicidal thoughts you must seek medical attention immediately by calling 911 or calling your MD immediately  if symptoms less severe.  You Must read complete instructions/literature along with all the possible adverse reactions/side effects for all the Medicines you take and that have been prescribed to you. Take any new Medicines after you have completely understood and accpet all the possible adverse reactions/side effects.   Please note  You were cared for by a hospitalist during your hospital stay. If you have any questions about your discharge medications or the care you received while you were in the hospital after you are discharged, you can call the unit and asked to speak with the hospitalist on call if the hospitalist that took care of you is not available. Once you are discharged, your primary care physician will handle any further medical issues. Please note that NO REFILLS for any discharge medications will be authorized once you are discharged, as it is imperative that you return to your primary care physician (or establish a relationship with a primary care physician if you do not have one) for your aftercare needs so that they can reassess your need for medications and monitor your lab values.    On the day of Discharge:  VITAL SIGNS:   Blood pressure 113/67, pulse (!) 104, temperature 97.9 F (36.6 C), temperature source Oral, resp. rate 20, height 5\' 2"  (1.575 m), weight 59.9 kg (132 lb 1.6 oz), SpO2 97 %.  PHYSICAL EXAMINATION:   GENERAL:  83 y.o.-year-old Elderly patient lying in the bed with no acute distress.  EYES: Pupils equal,  round, reactive to light and accommodation. No scleral icterus. Extraocular muscles intact.  HEENT: Head atraumatic, normocephalic. Right facial droop noted. Oropharynx and nasopharynx clear.  NECK:  Supple, no jugular venous distention. No thyroid enlargement, no tenderness.  LUNGS: Normal breath sounds bilaterally, no wheezing, rales,rhonchi or crepitation. No use of accessory muscles of respiration. Decreased bibasilar breath sounds CARDIOVASCULAR: S1, S2 normal. No rubs, or gallops. 3/6 systolic murmur is present ABDOMEN: Soft, nontender, nondistended. Bowel sounds present. No organomegaly or mass.  EXTREMITIES: No pedal edema, cyanosis, or clubbing.  NEUROLOGIC: Cranial nerves II through XII are intact. Right facial droop noted. Following simple commands. Upper extremity strength is 5/5 in both upper extremities, bilateral lower extremity strength is 2/5 due to generalized weakness. Sensation is intact. Gait is not tested. PSYCHIATRIC: The patient is alert but disoriented due to her dementia  SKIN: No obvious rash, lesion, or ulcer.   DATA REVIEW:   CBC  Recent Labs Lab 03/06/17 1127  WBC 11.6*  HGB 12.5  HCT 37.3  PLT 295    Chemistries   Recent Labs Lab 03/06/17 1127 03/08/17 0441  NA 139 138  K 3.6 3.1*  CL 106 108  CO2 24 25  GLUCOSE 129* 109*  BUN 18 16  CREATININE 0.88 0.78  CALCIUM 9.6 8.6*  AST 26  --   ALT 19  --   ALKPHOS 124  --   BILITOT 0.6  --      Microbiology Results  Results for orders placed or performed during the hospital encounter of 04/25/14  Surgical pcr screen     Status: None   Collection Time: 04/25/14 12:43 PM  Result Value Ref Range Status   MRSA, PCR NEGATIVE NEGATIVE Final   Staphylococcus aureus NEGATIVE NEGATIVE Final    Comment:        The Xpert SA Assay (FDA approved for NASAL specimens in patients over 21 years of age), is one component of a comprehensive surveillance program.  Test performance has been validated by  [facility] for patients greater than or equal to 9 year old. It is not intended to diagnose infection nor to guide or monitor treatment.    RADIOLOGY:  No results found.   Management plans discussed with the patient, family and they are in agreement.  CODE STATUS:     Code Status Orders        Start     Ordered   03/06/17 1351  Full code  Continuous     03/06/17 1354    Code Status History    Date Active Date Inactive Code Status Order ID Comments User Context   01/06/2016  1:41 PM 01/07/2016  4:01 PM Full Code [id number]  Samella Parr, NP ED   05/02/2014  2:11 PM 05/04/2014  2:58 PM Full Code [id number]  Johnny Bridge, MD Inpatient      TOTAL TIME TAKING CARE OF THIS PATIENT: 38 minutes.    Dorell Gatlin M.D on 03/08/2017 at 11:25 AM  Between 7am to 6pm - Pager - [phone]  After 6pm go to [email or web address] - Proofreader  [facility]  Office  [phone]  CC: Primary care physician; Crist Infante, MD   Note: This dictation was prepared with Dragon dictation along with smaller phrase technology. Any transcriptional errors that result from this process are unintentional.

## 2017-03-08 NOTE — Progress Notes (Signed)
Alert, confused per baseline. Son, in and reports pt has done fairly well in current living condition. Some expressive aphasia; however, reads well. Good appetite/VSS. SW in to speak to son discharge agreeable back to facility.

## 2017-03-08 NOTE — Clinical Social Work Note (Signed)
CSW received 2 consults for SNF placement. CSW has already assessed and is following.  Santiago Bumpers, MSW, Latanya Presser [phone]

## 2017-03-08 NOTE — Clinical Social Work Note (Signed)
Clinical Social Work Assessment  Patient Details  Name: Cathy Johnson MRN: [id number] Date of Birth: [birth year]  Date of referral:  03/08/17               Reason for consult:  Facility Placement                Permission sought to share information with:  Facility Art therapist granted to share information::     Name::        Agency::     Relationship::     Contact Information:     Housing/Transportation Living arrangements for the past 2 months:  [place] of Information:  Facility Patient Interpreter Needed:  None Criminal Activity/Legal Involvement Pertinent to Current Situation/Hospitalization:  No - Comment as needed Significant Relationships:  Adult Children, Community Support Lives with:  Facility Resident Do you feel safe going back to the place where you live?  Yes Need for family participation in patient care:  Yes (Comment)  Care giving concerns: PT recommendation for STR (Obs with Medicare); Patient admitted from ALF/MCU   Social Worker assessment / plan:  CSW attempted to contact both listed emergency contacts (the patient's sons) with no answer. Voicemail was left. The CSW contacted the facility [facility]) to gain information.  According to Med Wannetta Sender, the patient can return when stable. The patient, at baseline, is continent, needs little assistance with bathing and dressing, and she is able to feed herself. The CSW gave a brief update as to the patient's condition, and the staff thanked the CSW as they were worried about the patient.  Currently, the patient has a recommendation from PT for STR; however, as an observation patient under Medicare, the patient would be required to pay out of pocket for the service. The CSW plan is to continue attempting to contact the family to deliver this information and gain the decision between SNF vs. ALF with HHPT.   Employment status:  Retired Forensic scientist:   Medicare PT Recommendations:  [place] / Referral to community resources:     Patient/Family's Response to care:  The facility thanked the CSW.  Patient/Family's Understanding of and Emotional Response to Diagnosis, Current Treatment, and Prognosis:  The patient has dementia, and the family has not been reached.  Emotional Assessment Appearance:  Appears older than stated age Attitude/Demeanor/Rapport:   (Patient has dementia) Affect (typically observed):   (Patient has dementia) Orientation:  Oriented to Self Alcohol / Substance use:  Never Used Psych involvement (Current and /or in the community):  No (Comment)  Discharge Needs  Concerns to be addressed:  Care Coordination, Discharge Planning Concerns Readmission within the last 30 days:  No Current discharge risk:  Cognitively Impaired Barriers to Discharge:  Continued Medical Work up   [employer], LCSW 03/08/2017, 10:46 AM

## 2017-03-08 NOTE — NC FL2 (Signed)
[place] LEVEL OF CARE SCREENING TOOL     IDENTIFICATION  Patient Name: Cathy Johnson Birthdate: [birth year] Sex: female Admission Date (Current Location): 03/06/2017  [facility] and [place] Number:  Engineering geologist and Address:  [facility], [address] [place], [place] [address]      Provider Number: [id number]  Attending Physician Name and Address:  Gladstone Lighter, MD  Relative Name and Phone Number:       Current Level of Care: Hospital Recommended Level of Care: [place] Prior Approval Number:    Date Approved/Denied:   PASRR Number:    Discharge Plan: Domiciliary (Rest home)    Current Diagnoses: Patient Active Problem List   Diagnosis Date Noted  . Facial droop 03/06/2017  . AKI (acute kidney injury) ([place]) 01/06/2016  . Syncope 01/06/2016  . Dementia 01/06/2016  . HTN (hypertension) 01/06/2016  . Anemia 01/06/2016  . Osteoarthritis of right knee 05/02/2014  . Knee osteoarthritis 05/02/2014    Orientation RESPIRATION BLADDER Height & Weight     Self  Normal Continent Weight: 132 lb 1.6 oz (59.9 kg) Height:  5\' 2"  (157.5 cm)  BEHAVIORAL SYMPTOMS/MOOD NEUROLOGICAL BOWEL NUTRITION STATUS      Continent Diet (Heart Healthy/aspiration precautions)  AMBULATORY STATUS COMMUNICATION OF NEEDS Skin   Supervision/Assistance Verbally Normal                       Personal Care Assistance Level of Assistance  Bathing, Feeding, Dressing Bathing Assistance: Limited assistance Feeding assistance: Independent Dressing Assistance: Limited assistance     Functional Limitations Info             SPECIAL CARE FACTORS FREQUENCY  PT (By licensed PT)     PT Frequency: Home health PT              Contractures Contractures Info: Present    Additional Factors Info  Allergies   Allergies Info: Boniva Ibandronic Acid, Crestor Rosuvastatin Calcium, Exelon Rivastigmine Tartrate, Fosamax  Alendronate Sodium, Lipitor Atorvastatin, Morphine And Related, Vagifem Estradiol           Current Medications (03/08/2017):  This is the current hospital active medication list Current Facility-Administered Medications  Medication Dose Route Frequency Provider Last Rate Last Dose  . acetaminophen (TYLENOL) tablet 650 mg  650 mg Oral Q4H PRN Epifanio Lesches, MD   650 mg at 03/08/17 0003   Or  . acetaminophen (TYLENOL) solution 650 mg  650 mg Per Tube Q4H PRN Epifanio Lesches, MD       Or  . acetaminophen (TYLENOL) suppository 650 mg  650 mg Rectal Q4H PRN Epifanio Lesches, MD      . amLODipine (NORVASC) tablet 5 mg  5 mg Oral Daily Epifanio Lesches, MD   5 mg at 03/08/17 0916  . aspirin EC tablet 325 mg  325 mg Oral Daily Epifanio Lesches, MD   325 mg at 03/08/17 0917  . atorvastatin (LIPITOR) tablet 20 mg  20 mg Oral q1800 Epifanio Lesches, MD   20 mg at 03/07/17 1252  . clopidogrel (PLAVIX) tablet 75 mg  75 mg Oral Daily Gladstone Lighter, MD   75 mg at 03/08/17 0916  . enoxaparin (LOVENOX) injection 40 mg  40 mg Subcutaneous Q24H Epifanio Lesches, MD   40 mg at 03/07/17 2127  . irbesartan (AVAPRO) tablet 37.5 mg  37.5 mg Oral Daily Epifanio Lesches, MD   75 mg at 03/08/17 0916  . LORazepam (ATIVAN)  tablet 0.5 mg  0.5 mg Oral BID Epifanio Lesches, MD   0.5 mg at 03/08/17 0917  . memantine (NAMENDA) tablet 10 mg  10 mg Oral BID Epifanio Lesches, MD   10 mg at 03/08/17 0916  . risperiDONE (RISPERDAL) tablet 1 mg  1 mg Oral QHS Epifanio Lesches, MD   1 mg at 03/07/17 2127  . sodium chloride flush (NS) 0.9 % injection 3 mL  3 mL Intravenous Q12H Gladstone Lighter, MD   3 mL at 03/08/17 0927  . vitamin B-12 (CYANOCOBALAMIN) tablet 1,000 mcg  1,000 mcg Oral Daily Epifanio Lesches, MD   1,000 mcg at 03/08/17 0917  . [START ON 03/09/2017] Vitamin D (Ergocalciferol) (DRISDOL) capsule 50,000 Units  50,000 Units Oral Q7 days Epifanio Lesches, MD          Discharge Medications: Medication List    TAKE these medications   acetaminophen 500 MG tablet Commonly known as:  TYLENOL Take 500 mg by mouth every 8 (eight) hours as needed for mild pain.   amLODipine 5 MG tablet Commonly known as:  NORVASC Take 5 mg by mouth daily.   aspirin 325 MG tablet Take 325 mg by mouth daily.   atorvastatin 20 MG tablet Commonly known as:  LIPITOR Take 20 mg by mouth daily at 6 PM.   clopidogrel 75 MG tablet Commonly known as:  PLAVIX Take 1 tablet (75 mg total) by mouth daily. Start taking on:  03/09/2017   LORazepam 0.5 MG tablet Commonly known as:  ATIVAN Take 1 tablet (0.5 mg total) by mouth 2 (two) times daily.   memantine 10 MG tablet Commonly known as:  NAMENDA Take 10 mg by mouth 2 (two) times daily.   risperiDONE 1 MG tablet Commonly known as:  RISPERDAL Take 1 mg by mouth at bedtime.   telmisartan 40 MG tablet Commonly known as:  MICARDIS Take 40 mg by mouth daily.   vitamin B-12 1000 MCG tablet Commonly known as:  CYANOCOBALAMIN Take 1,000 mcg by mouth daily.   Vitamin D (Ergocalciferol) 50000 units Caps capsule Commonly known as:  DRISDOL Take 50,000 Units by mouth every 7 (seven) days.       Relevant Imaging Results:  Relevant Lab Results:   Additional Information SS# [id number]  Zettie Pho, LCSW

## 2017-03-08 NOTE — Plan of Care (Signed)
Problem: Education: Goal: Knowledge of disease or condition will improve Outcome: Completed/Met Date Met: 03/08/17 By son Goal: Knowledge of patient specific risk factors addressed and post discharge goals established will improve Outcome: Completed/Met Date Met: 03/08/17 By son

## 2017-03-08 NOTE — Care Management Note (Signed)
Case Management Note  Patient Details  Name: Cathy Johnson MRN: [id number] Date of Birth: [birth year]  Subjective/Objective:   Discussed discharge planing with son [name] who is also POA. [name] is returning to [place]. Mr Flanery chose [facility] to be [name] home health care provider. Johnson referral was called to [facility] at [facility] for hh=PT, RN, Aide, SW. No other needs identified.               Action/Plan:   Expected Discharge Date:  03/08/17               Expected Discharge Plan:   03/08/17  In-House Referral:     Discharge planning Services     Post Acute Care Choice:   Yes Choice offered to:   Dan Humphreys who is POA  DME Arranged:   NA DME Agency:   NA  HH Arranged:   RN, PT, Aide, SW HH Agency:   [facility]  Status of Service:   Completed.   If discussed at [place] of Stay Meetings, dates discussed:    Additional Comments:  Cathy Pienta A, RN 03/08/2017, 11:43 AM

## 2017-03-08 NOTE — Clinical Social Work Note (Signed)
CSW spoke with the patient's legal guardian, son Okla Qazi at bedside to discuss discharge planning. The family has chosen to have the patient return to [facility] with [place] due to the patient's observation status and to decrease the risk of delirium due to unfamiliar surroundings. The facility is willing to accept the discharge today, and her son is planning on transporting her. The CSW will continue to follow pending additional discharge needs.  Santiago Bumpers, MSW, Latanya Presser [phone]

## 2017-03-08 NOTE — Progress Notes (Signed)
Son agreeable to discharge back to [facility] assisted living with [facility] referral which has been submitted. Oral and written AVS instructions completed with son with stated understanding; signed ativan rx and printed prescription for Plavix given to son.  Pt slept most of morning. No co's. VSS.  Transported in transport chair with pt transferred into private vehicle with 1-2 with pt discharged.

## 2017-03-09 DIAGNOSIS — F0281 Dementia in other diseases classified elsewhere with behavioral disturbance: Secondary | ICD-10-CM | POA: Diagnosis not present

## 2017-03-09 DIAGNOSIS — I10 Essential (primary) hypertension: Secondary | ICD-10-CM | POA: Diagnosis not present

## 2017-03-09 DIAGNOSIS — M25561 Pain in right knee: Secondary | ICD-10-CM | POA: Diagnosis not present

## 2017-03-09 DIAGNOSIS — M25562 Pain in left knee: Secondary | ICD-10-CM | POA: Diagnosis not present

## 2017-03-09 DIAGNOSIS — M6281 Muscle weakness (generalized): Secondary | ICD-10-CM | POA: Diagnosis not present

## 2017-03-09 DIAGNOSIS — G309 Alzheimer's disease, unspecified: Secondary | ICD-10-CM | POA: Diagnosis not present

## 2017-03-10 ENCOUNTER — Emergency Department
Admission: EM | Admit: 2017-03-10 | Discharge: 2017-03-10 | Disposition: A | Discharge status: Home / Self Care | Payer: Medicare Other | Attending: Emergency Medicine | Admitting: Emergency Medicine

## 2017-03-10 ENCOUNTER — Emergency Department: Payer: Medicare Other

## 2017-03-10 DIAGNOSIS — I10 Essential (primary) hypertension: Secondary | ICD-10-CM | POA: Diagnosis not present

## 2017-03-10 DIAGNOSIS — T424X1A Poisoning by benzodiazepines, accidental (unintentional), initial encounter: Secondary | ICD-10-CM | POA: Diagnosis not present

## 2017-03-10 DIAGNOSIS — R740 Nonspecific elevation of levels of transaminase and lactic acid dehydrogenase [LDH]: Secondary | ICD-10-CM | POA: Insufficient documentation

## 2017-03-10 DIAGNOSIS — R55 Syncope and collapse: Secondary | ICD-10-CM | POA: Diagnosis not present

## 2017-03-10 DIAGNOSIS — R4182 Altered mental status, unspecified: Secondary | ICD-10-CM | POA: Diagnosis not present

## 2017-03-10 DIAGNOSIS — R7401 Elevation of levels of liver transaminase levels: Secondary | ICD-10-CM

## 2017-03-10 LAB — URINALYSIS, COMPLETE (UACMP) WITH MICROSCOPIC
BACTERIA UA: NONE SEEN
BILIRUBIN URINE: NEGATIVE
GLUCOSE, UA: NEGATIVE mg/dL
HGB URINE DIPSTICK: NEGATIVE
Ketones, ur: NEGATIVE mg/dL
LEUKOCYTES UA: NEGATIVE
NITRITE: NEGATIVE
PH: 5 (ref 5.0–8.0)
Protein, ur: NEGATIVE mg/dL
Specific Gravity, Urine: 1.016 (ref 1.005–1.030)
Squamous Epithelial / LPF: NONE SEEN

## 2017-03-10 LAB — COMPREHENSIVE METABOLIC PANEL
ALBUMIN: 3.5 g/dL (ref 3.5–5.0)
ALT: 105 U/L — ABNORMAL HIGH (ref 14–54)
ANION GAP: 8 (ref 5–15)
AST: 86 U/L — ABNORMAL HIGH (ref 15–41)
Alkaline Phosphatase: 127 U/L — ABNORMAL HIGH (ref 38–126)
BILIRUBIN TOTAL: 0.7 mg/dL (ref 0.3–1.2)
BUN: 17 mg/dL (ref 6–20)
CO2: 21 mmol/L — ABNORMAL LOW (ref 22–32)
Calcium: 8.8 mg/dL — ABNORMAL LOW (ref 8.9–10.3)
Chloride: 107 mmol/L (ref 101–111)
Creatinine, Ser: 0.71 mg/dL (ref 0.44–1.00)
GFR calc non Af Amer: >60 mL/min (ref >60)
GLUCOSE: 100 mg/dL — AB (ref 65–99)
POTASSIUM: 3.7 mmol/L (ref 3.5–5.1)
Sodium: 136 mmol/L (ref 135–145)
TOTAL PROTEIN: 7 g/dL (ref 6.5–8.1)

## 2017-03-10 LAB — CBC WITH DIFFERENTIAL/PLATELET
Basophils Absolute: 0 10*3/uL (ref 0–0.1)
Basophils Relative: 0 %
EOS ABS: 0.1 10*3/uL (ref 0–0.7)
Eosinophils Relative: 1 %
HEMATOCRIT: 34.1 % — AB (ref 35.0–47.0)
Hemoglobin: 11.4 g/dL — ABNORMAL LOW (ref 12.0–16.0)
Lymphocytes Relative: 12 %
Lymphs Abs: 1.2 10*3/uL (ref 1.0–3.6)
MCH: 28.8 pg (ref 26.0–34.0)
MCHC: 33.6 g/dL (ref 32.0–36.0)
MCV: 85.7 fL (ref 80.0–100.0)
MONO ABS: 1.1 10*3/uL — AB (ref 0.2–0.9)
MONOS PCT: 11 %
Neutro Abs: 7.5 10*3/uL — ABNORMAL HIGH (ref 1.4–6.5)
Neutrophils Relative %: 76 %
Platelets: 265 10*3/uL (ref 150–440)
RBC: 3.98 MIL/uL (ref 3.80–5.20)
RDW: 16.4 % — AB (ref 11.5–14.5)
WBC: 10 10*3/uL (ref 3.6–11.0)

## 2017-03-10 LAB — TROPONIN I

## 2017-03-10 LAB — GLUCOSE, CAPILLARY: Glucose-Capillary: 99 mg/dL (ref 65–99)

## 2017-03-10 MED ORDER — FLUMAZENIL 0.5 MG/5ML IV SOLN
0.2000 mg | Freq: Once | INTRAVENOUS | Status: AC
  Administered 2017-03-10: 0.2 mg via INTRAVENOUS
  Filled 2017-03-10: qty 5

## 2017-03-10 MED ORDER — SODIUM CHLORIDE 0.9 % IV SOLN
1000.0000 mL | Freq: Once | INTRAVENOUS | Status: AC
  Administered 2017-03-10: 1000 mL via INTRAVENOUS

## 2017-03-10 NOTE — ED Notes (Signed)
Social work at bedside speaking w/ son

## 2017-03-10 NOTE — ED Notes (Signed)
Patient transported to CT 

## 2017-03-10 NOTE — ED Notes (Signed)
Patient transported to Ultrasound 

## 2017-03-10 NOTE — ED Notes (Signed)
Pt assisted to toilet 

## 2017-03-10 NOTE — ED Provider Notes (Signed)
[facility] Emergency Department Provider Note       Time seen: ----------------------------------------- 12:44 PM on 03/10/2017 -----------------------------------------  Level V caveat: Review of systems and history is limited by altered mental status   I have reviewed the triage vital signs and the nursing notes.   HISTORY   Chief Complaint No chief complaint on file.    HPI Cathy Johnson is a 83 y.o. female who presents to the ED for altered mental status. Patient presents from a nursing home with altered mental status. Nursing home staff was concerned because she was slumped over and drooling and less responsive. Reportedly she had recently been admitted to the hospital for same and this was thought to be secondary to her dementia. She cannot provide any review of systems or history.   Past Medical History:  Diagnosis Date  . Arthritis   . Dementia   . Hypertension   . Osteoarthritis of right knee 05/02/2014  . PFO (patent foramen ovale)    PFO by bubble study 02/05/11 TEE  . Stroke [facility])     Patient Active Problem List   Diagnosis Date Noted  . Facial droop 03/06/2017  . AKI (acute kidney injury) ([place]) 01/06/2016  . Syncope 01/06/2016  . Dementia 01/06/2016  . HTN (hypertension) 01/06/2016  . Anemia 01/06/2016  . Osteoarthritis of right knee 05/02/2014  . Knee osteoarthritis 05/02/2014    Past Surgical History:  Procedure Laterality Date  . BREAST SURGERY     LT BREAST MASS REMOVED  . CARPAL TUNNEL RELEASE    . KNEE ARTHROSCOPY     RIGHT  . PARTIAL KNEE ARTHROPLASTY Right 05/02/2014   Procedure: UNICOMPARTMENTAL KNEE;  Surgeon: Johnny Bridge, MD;  Location: [facility];  Service: Orthopedics;  Laterality: Right;    Allergies Boniva [ibandronic acid]; Crestor [rosuvastatin calcium]; Exelon [rivastigmine tartrate]; Fosamax [alendronate sodium]; Lipitor [atorvastatin]; Morphine and related; and Vagifem [estradiol]  Social  History Social History  Substance Use Topics  . Smoking status: Never Smoker  . Smokeless tobacco: Never Used  . Alcohol use No    Review of Systems Unknown at this time, positive for altered mental status  All systems negative/normal/unremarkable except as stated in the HPI  ____________________________________________   PHYSICAL EXAM:  VITAL SIGNS: ED Triage Vitals  Enc Vitals Group     BP      Pulse      Resp      Temp      Temp src      SpO2      Weight      Height      Head Circumference      Peak Flow      Pain Score      Pain Loc      Pain Edu?      Excl. in [place]?     Constitutional:Lethargic and disoriented, in no distress  Eyes: Conjunctivae are normal.  ENT   Head: Normocephalic and atraumatic.   Nose: No congestion/rhinnorhea.   Mouth/Throat: Mucous membranes are moist.   Neck: No stridor. Cardiovascular: Normal rate, regular rhythm. No murmurs, rubs, or gallops. Respiratory: Normal respiratory effort without tachypnea nor retractions. Breath sounds are clear and equal bilaterally. No wheezes/rales/rhonchi. Gastrointestinal: Soft and nontender. Normal bowel sounds Musculoskeletal: Nontender with normal range of motion in extremities. No lower extremity tenderness nor edema. Neurologic:   lethargy, no gross focal neurologic deficits are appreciated Skin:  Skin is warm, dry and intact. No rash noted. Psychiatric:  lethargic  ____________________________________________  EKG: Interpreted by me.Sinus tachycardia with rate 109 bpm, normal PR interval, normal QRS, normal QT.  ____________________________________________  ED COURSE:  Pertinent labs & imaging results that were available during my care of the patient were reviewed by me and considered in my medical decision making (see chart for details). Patient presents for Altered mental status will assess with labs and imaging as indicated. Clinical Course as of Mar 10 1452  Tue Mar 10, 2017  1422 Patient is awake and alert after receiving flumazenil  [JW]    Clinical Course User Index [JW] Earleen Newport, MD   Procedures ____________________________________________   LABS (pertinent positives/negatives)  Labs Reviewed  CBC WITH DIFFERENTIAL/PLATELET - Abnormal; Notable for the following:       Result Value   Hemoglobin 11.4 (*)    HCT 34.1 (*)    RDW 16.4 (*)    Neutro Abs 7.5 (*)    Monocytes Absolute 1.1 (*)    All other components within normal limits  COMPREHENSIVE METABOLIC PANEL - Abnormal; Notable for the following:    CO2 21 (*)    Glucose, Bld 100 (*)    Calcium 8.8 (*)    AST 86 (*)    ALT 105 (*)    Alkaline Phosphatase 127 (*)    All other components within normal limits  URINALYSIS, COMPLETE (UACMP) WITH MICROSCOPIC - Abnormal; Notable for the following:    Color, Urine YELLOW (*)    APPearance CLEAR (*)    All other components within normal limits  TROPONIN I  GLUCOSE, CAPILLARY  CBG MONITORING, ED    RADIOLOGY Images were viewed by me  CT head, chest x-ray IMPRESSION: 1. No acute intracranial abnormality identified. 2. Stable moderate chronic microvascular ischemic changes and moderate parenchymal volume loss of the brain. ___________________________________________  FINAL ASSESSMENT AND PLAN  Altered mental status, Transaminitis  Plan: Patient's labs and imaging were dictated above. Patient had presented for Altered mental status which appear to be related to benzodiazepines. She appears pleasant at this time and at her baseline. No other acute emergency medical conditions have been identified at this time. She does have elevated liver transaminases which will need to be rechecked.   Earleen Newport, MD   Note: This note was generated in part or whole with voice recognition software. Voice recognition is usually quite accurate but there are transcription errors that can and very often do occur. I apologize for  any typographical errors that were not detected and corrected.     Earleen Newport, MD 03/10/17 1455

## 2017-03-10 NOTE — ED Notes (Signed)
Pt called out to use bathroom. Pt placed on bedpan, urinated, saturated bed linen and gown. Placed clean linen on bed and gown on pt.

## 2017-03-10 NOTE — ED Notes (Signed)
Pt bib EMS from [facility] memory unit w/ c/o unresponsive episode. Per EMS, staff reported that pt slumped over in chair and was drooling. Ems reports that on arrival pt was alert, able to answer name/birthday.  Per EMS, pt was seen and hospitalized for same last week.  Pt able to follow some commands, will answer questions.  Pt has hx of TIA per EMS.  CBG 107 for EMS.

## 2017-03-10 NOTE — ED Notes (Signed)
Pt placed on bedpan at this time, no urine return in pan at this time. Pt states unable to urinate now

## 2017-03-10 NOTE — Progress Notes (Cosign Needed)
LCSW and Week day LCSW reviewed patient information and called " [facility] [phone] and spoke with DON. They have indicated that they feel the patient needs in patient re-hab at SNF. It was explained that patient has [facility]  PT arranged and they will follow up this week. Patient has HH-PT, RN, aid and Education officer, museum and son was encouraged to access all resources and services. He already disclosed he has a family member who is willing to provide sitter care and previous care provider is assisting with his Mom in transitioning to new [place].  Patient can return to [facility] as per [place] DON of the cottage   EDP and ED RN notified she can return, LCSW and LCSW weekday  met with patient and wished her well and speedy recovery. Son has given permission to follow up with area resources for future consult.  [employer] LCSW [phone]

## 2017-03-10 NOTE — ED Provider Notes (Signed)
Patient awaiting social work consultation. Possible disposition back to her skilled nursing facility.  Physical Exam  BP (!) 178/94 (BP Location: Left Arm)   Pulse (!) 117   Temp 98.2 F (36.8 C) (Oral)   Resp (!) 22   Ht 5\' 2"  (1.575 m)   Wt 59.9 kg (132 lb)   SpO2 98%   BMI 24.14 kg/m  ----------------------------------------- 5:05 PM on 03/10/2017 -----------------------------------------   Physical Exam No complaints at this time. Able to ambulate with assistance. ED Course  Procedures  MDM A sitter has been arranged for the patient and her skilled nursing facility has excepted her back. She'll be discharged home. Family is aware the plan and willing to comply.       Orbie Pyo, MD 03/10/17 [phone]

## 2017-03-10 NOTE — ED Notes (Signed)
MD Schaevitz informed of social work consult and that pt would be able to return [facility] [employer] [facility]

## 2017-03-10 NOTE — ED Notes (Addendum)
Cathy Johnson (pt son) [phone]

## 2017-03-10 NOTE — ED Notes (Signed)
Family at bedside. 

## 2017-03-13 DIAGNOSIS — Z6825 Body mass index (BMI) 25.0-25.9, adult: Secondary | ICD-10-CM | POA: Diagnosis not present

## 2017-03-13 DIAGNOSIS — E784 Other hyperlipidemia: Secondary | ICD-10-CM | POA: Diagnosis not present

## 2017-03-13 DIAGNOSIS — T424X4A Poisoning by benzodiazepines, undetermined, initial encounter: Secondary | ICD-10-CM | POA: Diagnosis not present

## 2017-03-13 DIAGNOSIS — F0390 Unspecified dementia without behavioral disturbance: Secondary | ICD-10-CM | POA: Diagnosis not present

## 2017-03-13 DIAGNOSIS — R000 Tachycardia, unspecified: Secondary | ICD-10-CM | POA: Diagnosis not present

## 2017-03-13 DIAGNOSIS — I10 Essential (primary) hypertension: Secondary | ICD-10-CM | POA: Diagnosis not present

## 2017-03-13 DIAGNOSIS — R748 Abnormal levels of other serum enzymes: Secondary | ICD-10-CM | POA: Diagnosis not present

## 2017-03-13 DIAGNOSIS — G458 Other transient cerebral ischemic attacks and related syndromes: Secondary | ICD-10-CM | POA: Diagnosis not present

## 2017-03-17 DIAGNOSIS — M25561 Pain in right knee: Secondary | ICD-10-CM | POA: Diagnosis not present

## 2017-03-17 DIAGNOSIS — M6281 Muscle weakness (generalized): Secondary | ICD-10-CM | POA: Diagnosis not present

## 2017-03-17 DIAGNOSIS — F0281 Dementia in other diseases classified elsewhere with behavioral disturbance: Secondary | ICD-10-CM | POA: Diagnosis not present

## 2017-03-17 DIAGNOSIS — M25562 Pain in left knee: Secondary | ICD-10-CM | POA: Diagnosis not present

## 2017-03-17 DIAGNOSIS — G309 Alzheimer's disease, unspecified: Secondary | ICD-10-CM | POA: Diagnosis not present

## 2017-03-17 DIAGNOSIS — I10 Essential (primary) hypertension: Secondary | ICD-10-CM | POA: Diagnosis not present

## 2017-03-19 DIAGNOSIS — M25561 Pain in right knee: Secondary | ICD-10-CM | POA: Diagnosis not present

## 2017-03-19 DIAGNOSIS — G309 Alzheimer's disease, unspecified: Secondary | ICD-10-CM | POA: Diagnosis not present

## 2017-03-19 DIAGNOSIS — F0281 Dementia in other diseases classified elsewhere with behavioral disturbance: Secondary | ICD-10-CM | POA: Diagnosis not present

## 2017-03-19 DIAGNOSIS — I10 Essential (primary) hypertension: Secondary | ICD-10-CM | POA: Diagnosis not present

## 2017-03-19 DIAGNOSIS — M6281 Muscle weakness (generalized): Secondary | ICD-10-CM | POA: Diagnosis not present

## 2017-03-19 DIAGNOSIS — M25562 Pain in left knee: Secondary | ICD-10-CM | POA: Diagnosis not present

## 2017-03-24 DIAGNOSIS — G309 Alzheimer's disease, unspecified: Secondary | ICD-10-CM | POA: Diagnosis not present

## 2017-03-24 DIAGNOSIS — Q211 Atrial septal defect: Secondary | ICD-10-CM | POA: Diagnosis not present

## 2017-03-24 DIAGNOSIS — F418 Other specified anxiety disorders: Secondary | ICD-10-CM | POA: Diagnosis not present

## 2017-03-24 DIAGNOSIS — Z8673 Personal history of transient ischemic attack (TIA), and cerebral infarction without residual deficits: Secondary | ICD-10-CM | POA: Diagnosis not present

## 2017-03-24 DIAGNOSIS — F0280 Dementia in other diseases classified elsewhere without behavioral disturbance: Secondary | ICD-10-CM | POA: Diagnosis not present

## 2017-03-24 DIAGNOSIS — M1711 Unilateral primary osteoarthritis, right knee: Secondary | ICD-10-CM | POA: Diagnosis not present

## 2017-03-24 DIAGNOSIS — I10 Essential (primary) hypertension: Secondary | ICD-10-CM | POA: Diagnosis not present

## 2017-03-24 DIAGNOSIS — E784 Other hyperlipidemia: Secondary | ICD-10-CM | POA: Diagnosis not present

## 2017-03-24 DIAGNOSIS — Z9181 History of falling: Secondary | ICD-10-CM | POA: Diagnosis not present

## 2017-03-25 DIAGNOSIS — M25562 Pain in left knee: Secondary | ICD-10-CM | POA: Diagnosis not present

## 2017-03-25 DIAGNOSIS — M6281 Muscle weakness (generalized): Secondary | ICD-10-CM | POA: Diagnosis not present

## 2017-03-25 DIAGNOSIS — I10 Essential (primary) hypertension: Secondary | ICD-10-CM | POA: Diagnosis not present

## 2017-03-25 DIAGNOSIS — G309 Alzheimer's disease, unspecified: Secondary | ICD-10-CM | POA: Diagnosis not present

## 2017-03-25 DIAGNOSIS — M25561 Pain in right knee: Secondary | ICD-10-CM | POA: Diagnosis not present

## 2017-03-25 DIAGNOSIS — F0281 Dementia in other diseases classified elsewhere with behavioral disturbance: Secondary | ICD-10-CM | POA: Diagnosis not present

## 2017-03-26 DIAGNOSIS — M25561 Pain in right knee: Secondary | ICD-10-CM | POA: Diagnosis not present

## 2017-03-26 DIAGNOSIS — I10 Essential (primary) hypertension: Secondary | ICD-10-CM | POA: Diagnosis not present

## 2017-03-26 DIAGNOSIS — M25562 Pain in left knee: Secondary | ICD-10-CM | POA: Diagnosis not present

## 2017-03-26 DIAGNOSIS — G309 Alzheimer's disease, unspecified: Secondary | ICD-10-CM | POA: Diagnosis not present

## 2017-03-26 DIAGNOSIS — F0281 Dementia in other diseases classified elsewhere with behavioral disturbance: Secondary | ICD-10-CM | POA: Diagnosis not present

## 2017-03-26 DIAGNOSIS — M6281 Muscle weakness (generalized): Secondary | ICD-10-CM | POA: Diagnosis not present

## 2017-03-31 DIAGNOSIS — M6281 Muscle weakness (generalized): Secondary | ICD-10-CM | POA: Diagnosis not present

## 2017-03-31 DIAGNOSIS — I10 Essential (primary) hypertension: Secondary | ICD-10-CM | POA: Diagnosis not present

## 2017-03-31 DIAGNOSIS — M25561 Pain in right knee: Secondary | ICD-10-CM | POA: Diagnosis not present

## 2017-03-31 DIAGNOSIS — F0281 Dementia in other diseases classified elsewhere with behavioral disturbance: Secondary | ICD-10-CM | POA: Diagnosis not present

## 2017-03-31 DIAGNOSIS — G309 Alzheimer's disease, unspecified: Secondary | ICD-10-CM | POA: Diagnosis not present

## 2017-03-31 DIAGNOSIS — M25562 Pain in left knee: Secondary | ICD-10-CM | POA: Diagnosis not present

## 2017-04-02 DIAGNOSIS — M25562 Pain in left knee: Secondary | ICD-10-CM | POA: Diagnosis not present

## 2017-04-02 DIAGNOSIS — G309 Alzheimer's disease, unspecified: Secondary | ICD-10-CM | POA: Diagnosis not present

## 2017-04-02 DIAGNOSIS — M25561 Pain in right knee: Secondary | ICD-10-CM | POA: Diagnosis not present

## 2017-04-02 DIAGNOSIS — I10 Essential (primary) hypertension: Secondary | ICD-10-CM | POA: Diagnosis not present

## 2017-04-02 DIAGNOSIS — M6281 Muscle weakness (generalized): Secondary | ICD-10-CM | POA: Diagnosis not present

## 2017-04-02 DIAGNOSIS — F0281 Dementia in other diseases classified elsewhere with behavioral disturbance: Secondary | ICD-10-CM | POA: Diagnosis not present

## 2017-04-06 DIAGNOSIS — F0280 Dementia in other diseases classified elsewhere without behavioral disturbance: Secondary | ICD-10-CM | POA: Diagnosis not present

## 2017-04-06 DIAGNOSIS — M1711 Unilateral primary osteoarthritis, right knee: Secondary | ICD-10-CM | POA: Diagnosis not present

## 2017-04-06 DIAGNOSIS — E785 Hyperlipidemia, unspecified: Secondary | ICD-10-CM | POA: Diagnosis not present

## 2017-04-06 DIAGNOSIS — Z8673 Personal history of transient ischemic attack (TIA), and cerebral infarction without residual deficits: Secondary | ICD-10-CM | POA: Diagnosis not present

## 2017-04-06 DIAGNOSIS — I10 Essential (primary) hypertension: Secondary | ICD-10-CM | POA: Diagnosis not present

## 2017-04-06 DIAGNOSIS — Q211 Atrial septal defect: Secondary | ICD-10-CM | POA: Diagnosis not present

## 2017-04-06 DIAGNOSIS — G308 Other Alzheimer's disease: Secondary | ICD-10-CM | POA: Diagnosis not present

## 2017-04-06 DIAGNOSIS — F418 Other specified anxiety disorders: Secondary | ICD-10-CM | POA: Diagnosis not present

## 2017-04-06 DIAGNOSIS — F419 Anxiety disorder, unspecified: Secondary | ICD-10-CM | POA: Diagnosis not present

## 2017-04-06 DIAGNOSIS — G309 Alzheimer's disease, unspecified: Secondary | ICD-10-CM | POA: Diagnosis not present

## 2017-04-06 DIAGNOSIS — Z9181 History of falling: Secondary | ICD-10-CM | POA: Diagnosis not present

## 2017-04-06 DIAGNOSIS — E784 Other hyperlipidemia: Secondary | ICD-10-CM | POA: Diagnosis not present

## 2017-04-09 DIAGNOSIS — F419 Anxiety disorder, unspecified: Secondary | ICD-10-CM | POA: Diagnosis not present

## 2017-04-09 DIAGNOSIS — Z6825 Body mass index (BMI) 25.0-25.9, adult: Secondary | ICD-10-CM | POA: Diagnosis not present

## 2017-04-09 DIAGNOSIS — I10 Essential (primary) hypertension: Secondary | ICD-10-CM | POA: Diagnosis not present

## 2017-04-09 DIAGNOSIS — N390 Urinary tract infection, site not specified: Secondary | ICD-10-CM | POA: Diagnosis not present

## 2017-04-09 DIAGNOSIS — E785 Hyperlipidemia, unspecified: Secondary | ICD-10-CM | POA: Diagnosis not present

## 2017-04-09 DIAGNOSIS — G458 Other transient cerebral ischemic attacks and related syndromes: Secondary | ICD-10-CM | POA: Diagnosis not present

## 2017-04-09 DIAGNOSIS — G309 Alzheimer's disease, unspecified: Secondary | ICD-10-CM | POA: Diagnosis not present

## 2017-04-09 DIAGNOSIS — R748 Abnormal levels of other serum enzymes: Secondary | ICD-10-CM | POA: Diagnosis not present

## 2017-04-09 DIAGNOSIS — M1711 Unilateral primary osteoarthritis, right knee: Secondary | ICD-10-CM | POA: Diagnosis not present

## 2017-04-09 DIAGNOSIS — F0280 Dementia in other diseases classified elsewhere without behavioral disturbance: Secondary | ICD-10-CM | POA: Diagnosis not present

## 2017-04-09 DIAGNOSIS — F0390 Unspecified dementia without behavioral disturbance: Secondary | ICD-10-CM | POA: Diagnosis not present

## 2017-04-13 DIAGNOSIS — M170 Bilateral primary osteoarthritis of knee: Secondary | ICD-10-CM | POA: Diagnosis not present

## 2017-04-14 DIAGNOSIS — M1711 Unilateral primary osteoarthritis, right knee: Secondary | ICD-10-CM | POA: Diagnosis not present

## 2017-04-14 DIAGNOSIS — I10 Essential (primary) hypertension: Secondary | ICD-10-CM | POA: Diagnosis not present

## 2017-04-14 DIAGNOSIS — E785 Hyperlipidemia, unspecified: Secondary | ICD-10-CM | POA: Diagnosis not present

## 2017-04-14 DIAGNOSIS — G309 Alzheimer's disease, unspecified: Secondary | ICD-10-CM | POA: Diagnosis not present

## 2017-04-14 DIAGNOSIS — F419 Anxiety disorder, unspecified: Secondary | ICD-10-CM | POA: Diagnosis not present

## 2017-04-14 DIAGNOSIS — F0280 Dementia in other diseases classified elsewhere without behavioral disturbance: Secondary | ICD-10-CM | POA: Diagnosis not present

## 2017-04-16 DIAGNOSIS — I10 Essential (primary) hypertension: Secondary | ICD-10-CM | POA: Diagnosis not present

## 2017-04-16 DIAGNOSIS — G309 Alzheimer's disease, unspecified: Secondary | ICD-10-CM | POA: Diagnosis not present

## 2017-04-16 DIAGNOSIS — F0280 Dementia in other diseases classified elsewhere without behavioral disturbance: Secondary | ICD-10-CM | POA: Diagnosis not present

## 2017-04-16 DIAGNOSIS — F419 Anxiety disorder, unspecified: Secondary | ICD-10-CM | POA: Diagnosis not present

## 2017-04-16 DIAGNOSIS — E785 Hyperlipidemia, unspecified: Secondary | ICD-10-CM | POA: Diagnosis not present

## 2017-04-16 DIAGNOSIS — M1711 Unilateral primary osteoarthritis, right knee: Secondary | ICD-10-CM | POA: Diagnosis not present

## 2017-04-20 DIAGNOSIS — G309 Alzheimer's disease, unspecified: Secondary | ICD-10-CM | POA: Diagnosis not present

## 2017-04-20 DIAGNOSIS — F0280 Dementia in other diseases classified elsewhere without behavioral disturbance: Secondary | ICD-10-CM | POA: Diagnosis not present

## 2017-04-20 DIAGNOSIS — F419 Anxiety disorder, unspecified: Secondary | ICD-10-CM | POA: Diagnosis not present

## 2017-04-20 DIAGNOSIS — I10 Essential (primary) hypertension: Secondary | ICD-10-CM | POA: Diagnosis not present

## 2017-04-20 DIAGNOSIS — M1711 Unilateral primary osteoarthritis, right knee: Secondary | ICD-10-CM | POA: Diagnosis not present

## 2017-04-20 DIAGNOSIS — E785 Hyperlipidemia, unspecified: Secondary | ICD-10-CM | POA: Diagnosis not present

## 2017-04-21 DIAGNOSIS — E785 Hyperlipidemia, unspecified: Secondary | ICD-10-CM | POA: Diagnosis not present

## 2017-04-21 DIAGNOSIS — M1711 Unilateral primary osteoarthritis, right knee: Secondary | ICD-10-CM | POA: Diagnosis not present

## 2017-04-21 DIAGNOSIS — I10 Essential (primary) hypertension: Secondary | ICD-10-CM | POA: Diagnosis not present

## 2017-04-21 DIAGNOSIS — F419 Anxiety disorder, unspecified: Secondary | ICD-10-CM | POA: Diagnosis not present

## 2017-04-21 DIAGNOSIS — G309 Alzheimer's disease, unspecified: Secondary | ICD-10-CM | POA: Diagnosis not present

## 2017-04-21 DIAGNOSIS — F0280 Dementia in other diseases classified elsewhere without behavioral disturbance: Secondary | ICD-10-CM | POA: Diagnosis not present

## 2017-04-23 DIAGNOSIS — F0280 Dementia in other diseases classified elsewhere without behavioral disturbance: Secondary | ICD-10-CM | POA: Diagnosis not present

## 2017-04-23 DIAGNOSIS — G309 Alzheimer's disease, unspecified: Secondary | ICD-10-CM | POA: Diagnosis not present

## 2017-04-23 DIAGNOSIS — F419 Anxiety disorder, unspecified: Secondary | ICD-10-CM | POA: Diagnosis not present

## 2017-04-23 DIAGNOSIS — I10 Essential (primary) hypertension: Secondary | ICD-10-CM | POA: Diagnosis not present

## 2017-04-23 DIAGNOSIS — M1711 Unilateral primary osteoarthritis, right knee: Secondary | ICD-10-CM | POA: Diagnosis not present

## 2017-04-23 DIAGNOSIS — E785 Hyperlipidemia, unspecified: Secondary | ICD-10-CM | POA: Diagnosis not present

## 2017-04-27 DIAGNOSIS — F0280 Dementia in other diseases classified elsewhere without behavioral disturbance: Secondary | ICD-10-CM | POA: Diagnosis not present

## 2017-04-27 DIAGNOSIS — M1711 Unilateral primary osteoarthritis, right knee: Secondary | ICD-10-CM | POA: Diagnosis not present

## 2017-04-27 DIAGNOSIS — I10 Essential (primary) hypertension: Secondary | ICD-10-CM | POA: Diagnosis not present

## 2017-04-27 DIAGNOSIS — G309 Alzheimer's disease, unspecified: Secondary | ICD-10-CM | POA: Diagnosis not present

## 2017-04-27 DIAGNOSIS — E785 Hyperlipidemia, unspecified: Secondary | ICD-10-CM | POA: Diagnosis not present

## 2017-04-27 DIAGNOSIS — F419 Anxiety disorder, unspecified: Secondary | ICD-10-CM | POA: Diagnosis not present

## 2017-04-28 DIAGNOSIS — G309 Alzheimer's disease, unspecified: Secondary | ICD-10-CM | POA: Diagnosis not present

## 2017-04-28 DIAGNOSIS — F0280 Dementia in other diseases classified elsewhere without behavioral disturbance: Secondary | ICD-10-CM | POA: Diagnosis not present

## 2017-04-28 DIAGNOSIS — M1711 Unilateral primary osteoarthritis, right knee: Secondary | ICD-10-CM | POA: Diagnosis not present

## 2017-04-28 DIAGNOSIS — E785 Hyperlipidemia, unspecified: Secondary | ICD-10-CM | POA: Diagnosis not present

## 2017-04-28 DIAGNOSIS — F419 Anxiety disorder, unspecified: Secondary | ICD-10-CM | POA: Diagnosis not present

## 2017-04-28 DIAGNOSIS — I10 Essential (primary) hypertension: Secondary | ICD-10-CM | POA: Diagnosis not present

## 2017-04-30 DIAGNOSIS — M1711 Unilateral primary osteoarthritis, right knee: Secondary | ICD-10-CM | POA: Diagnosis not present

## 2017-04-30 DIAGNOSIS — I10 Essential (primary) hypertension: Secondary | ICD-10-CM | POA: Diagnosis not present

## 2017-04-30 DIAGNOSIS — G309 Alzheimer's disease, unspecified: Secondary | ICD-10-CM | POA: Diagnosis not present

## 2017-04-30 DIAGNOSIS — F0280 Dementia in other diseases classified elsewhere without behavioral disturbance: Secondary | ICD-10-CM | POA: Diagnosis not present

## 2017-04-30 DIAGNOSIS — F419 Anxiety disorder, unspecified: Secondary | ICD-10-CM | POA: Diagnosis not present

## 2017-04-30 DIAGNOSIS — E785 Hyperlipidemia, unspecified: Secondary | ICD-10-CM | POA: Diagnosis not present

## 2017-05-04 DIAGNOSIS — I10 Essential (primary) hypertension: Secondary | ICD-10-CM | POA: Diagnosis not present

## 2017-05-04 DIAGNOSIS — F419 Anxiety disorder, unspecified: Secondary | ICD-10-CM | POA: Diagnosis not present

## 2017-05-04 DIAGNOSIS — F0280 Dementia in other diseases classified elsewhere without behavioral disturbance: Secondary | ICD-10-CM | POA: Diagnosis not present

## 2017-05-04 DIAGNOSIS — G309 Alzheimer's disease, unspecified: Secondary | ICD-10-CM | POA: Diagnosis not present

## 2017-05-04 DIAGNOSIS — M1711 Unilateral primary osteoarthritis, right knee: Secondary | ICD-10-CM | POA: Diagnosis not present

## 2017-05-04 DIAGNOSIS — E785 Hyperlipidemia, unspecified: Secondary | ICD-10-CM | POA: Diagnosis not present

## 2017-05-05 DIAGNOSIS — G309 Alzheimer's disease, unspecified: Secondary | ICD-10-CM | POA: Diagnosis not present

## 2017-05-05 DIAGNOSIS — F419 Anxiety disorder, unspecified: Secondary | ICD-10-CM | POA: Diagnosis not present

## 2017-05-05 DIAGNOSIS — F0280 Dementia in other diseases classified elsewhere without behavioral disturbance: Secondary | ICD-10-CM | POA: Diagnosis not present

## 2017-05-05 DIAGNOSIS — E785 Hyperlipidemia, unspecified: Secondary | ICD-10-CM | POA: Diagnosis not present

## 2017-05-05 DIAGNOSIS — I10 Essential (primary) hypertension: Secondary | ICD-10-CM | POA: Diagnosis not present

## 2017-05-05 DIAGNOSIS — M1711 Unilateral primary osteoarthritis, right knee: Secondary | ICD-10-CM | POA: Diagnosis not present

## 2017-05-07 DIAGNOSIS — I10 Essential (primary) hypertension: Secondary | ICD-10-CM | POA: Diagnosis not present

## 2017-05-07 DIAGNOSIS — M1711 Unilateral primary osteoarthritis, right knee: Secondary | ICD-10-CM | POA: Diagnosis not present

## 2017-05-07 DIAGNOSIS — F419 Anxiety disorder, unspecified: Secondary | ICD-10-CM | POA: Diagnosis not present

## 2017-05-07 DIAGNOSIS — E785 Hyperlipidemia, unspecified: Secondary | ICD-10-CM | POA: Diagnosis not present

## 2017-05-07 DIAGNOSIS — F0280 Dementia in other diseases classified elsewhere without behavioral disturbance: Secondary | ICD-10-CM | POA: Diagnosis not present

## 2017-05-07 DIAGNOSIS — G309 Alzheimer's disease, unspecified: Secondary | ICD-10-CM | POA: Diagnosis not present

## 2017-06-19 ENCOUNTER — Emergency Department
Admission: EM | Admit: 2017-06-19 | Discharge: 2017-06-19 | Disposition: A | Discharge status: Skilled Nursing Facility | Payer: Medicare Other | Attending: Emergency Medicine | Admitting: Emergency Medicine

## 2017-06-19 ENCOUNTER — Encounter: Payer: Self-pay | Admitting: Emergency Medicine

## 2017-06-19 DIAGNOSIS — F0390 Unspecified dementia without behavioral disturbance: Secondary | ICD-10-CM | POA: Diagnosis not present

## 2017-06-19 DIAGNOSIS — R4020 Unspecified coma: Secondary | ICD-10-CM | POA: Diagnosis not present

## 2017-06-19 DIAGNOSIS — I10 Essential (primary) hypertension: Secondary | ICD-10-CM | POA: Insufficient documentation

## 2017-06-19 DIAGNOSIS — R4182 Altered mental status, unspecified: Secondary | ICD-10-CM

## 2017-06-19 LAB — GLUCOSE, CAPILLARY: Glucose-Capillary: 82 mg/dL (ref 65–99)

## 2017-06-19 LAB — COMPREHENSIVE METABOLIC PANEL
ALBUMIN: 3.7 g/dL (ref 3.5–5.0)
ALK PHOS: 83 U/L (ref 38–126)
ALT: 14 U/L (ref 14–54)
AST: 21 U/L (ref 15–41)
Anion gap: 8 (ref 5–15)
BILIRUBIN TOTAL: 0.6 mg/dL (ref 0.3–1.2)
BUN: 19 mg/dL (ref 6–20)
CALCIUM: 8.8 mg/dL — AB (ref 8.9–10.3)
CO2: 26 mmol/L (ref 22–32)
Chloride: 107 mmol/L (ref 101–111)
Creatinine, Ser: 0.84 mg/dL (ref 0.44–1.00)
GFR calc Af Amer: >60 mL/min (ref >60)
GLUCOSE: 90 mg/dL (ref 65–99)
Potassium: 3.7 mmol/L (ref 3.5–5.1)
Sodium: 141 mmol/L (ref 135–145)
TOTAL PROTEIN: 6.3 g/dL — AB (ref 6.5–8.1)

## 2017-06-19 LAB — URINALYSIS, COMPLETE (UACMP) WITH MICROSCOPIC
BACTERIA UA: NONE SEEN
Bilirubin Urine: NEGATIVE
Glucose, UA: NEGATIVE mg/dL
Hgb urine dipstick: NEGATIVE
Ketones, ur: NEGATIVE mg/dL
LEUKOCYTES UA: NEGATIVE
Nitrite: NEGATIVE
PROTEIN: NEGATIVE mg/dL
SPECIFIC GRAVITY, URINE: 1.012 (ref 1.005–1.030)
pH: 7 (ref 5.0–8.0)

## 2017-06-19 LAB — CBC WITH DIFFERENTIAL/PLATELET
BASOS ABS: 0 10*3/uL (ref 0–0.1)
BASOS PCT: 1 %
Eosinophils Absolute: 0.2 10*3/uL (ref 0–0.7)
Eosinophils Relative: 2 %
HEMATOCRIT: 32.8 % — AB (ref 35.0–47.0)
HEMOGLOBIN: 11.1 g/dL — AB (ref 12.0–16.0)
LYMPHS PCT: 16 %
Lymphs Abs: 1.2 10*3/uL (ref 1.0–3.6)
MCH: 29.3 pg (ref 26.0–34.0)
MCHC: 33.7 g/dL (ref 32.0–36.0)
MCV: 86.9 fL (ref 80.0–100.0)
MONO ABS: 0.7 10*3/uL (ref 0.2–0.9)
Monocytes Relative: 10 %
NEUTROS PCT: 71 %
Neutro Abs: 5.5 10*3/uL (ref 1.4–6.5)
Platelets: 237 10*3/uL (ref 150–440)
RBC: 3.77 MIL/uL — ABNORMAL LOW (ref 3.80–5.20)
RDW: 16.6 % — AB (ref 11.5–14.5)
WBC: 7.6 10*3/uL (ref 3.6–11.0)

## 2017-06-19 LAB — TROPONIN I

## 2017-06-19 NOTE — ED Notes (Signed)
Pt unable to sign d/t mental status

## 2017-06-19 NOTE — ED Triage Notes (Signed)
Pt brought in by [facility] from [place] care for near syncope. Per EMS they were called out for CPR but staff called back stating that pt had woken up. Staff reported that during her morning medications pt appeared to go "unresponsive". Upon arrival to ER pt alert to her baseline, color WNL.

## 2017-06-19 NOTE — ED Notes (Signed)
Pt ambulated to restroom with assistance.  

## 2017-06-19 NOTE — ED Notes (Signed)
Pt able to ambulate to the restroom with assistance and void. Dr. Jimmye Norman informed and order for in and out cancelled.

## 2017-06-19 NOTE — Progress Notes (Addendum)
LCSW met with patient son and will consult with ED EN to make inquiry. Assessment to follow. LCSW put sons information Verdine Grenfell [place] LCSW [phone]

## 2017-06-19 NOTE — ED Provider Notes (Signed)
[facility] Emergency Department Provider Note       Time seen: ----------------------------------------- 7:17 AM on 06/19/2017 -----------------------------------------  Level V caveat: History/ROS limited by dementia   I have reviewed the triage vital signs and the nursing notes.   HISTORY   Chief Complaint No chief complaint on file.    HPI Cathy Johnson is a 83 y.o. female who presents to the ED for altered mental status.Patient is a resident of a memory care unit at a nursing home. EMS was initially called out for CPR although no CPR was performed. They arrived to find the patient completely alert and supposedly she woke up abruptly when EMS arrived. Patient has no complaints at this time.   Past Medical History:  Diagnosis Date  . Arthritis   . Dementia   . Hypertension   . Osteoarthritis of right knee 05/02/2014  . PFO (patent foramen ovale)    PFO by bubble study 02/05/11 TEE  . Stroke [facility])     Patient Active Problem List   Diagnosis Date Noted  . Facial droop 03/06/2017  . AKI (acute kidney injury) ([place]) 01/06/2016  . Syncope 01/06/2016  . Dementia 01/06/2016  . HTN (hypertension) 01/06/2016  . Anemia 01/06/2016  . Osteoarthritis of right knee 05/02/2014  . Knee osteoarthritis 05/02/2014    Past Surgical History:  Procedure Laterality Date  . BREAST SURGERY     LT BREAST MASS REMOVED  . CARPAL TUNNEL RELEASE    . KNEE ARTHROSCOPY     RIGHT  . PARTIAL KNEE ARTHROPLASTY Right 05/02/2014   Procedure: UNICOMPARTMENTAL KNEE;  Surgeon: Johnny Bridge, MD;  Location: [place];  Service: Orthopedics;  Laterality: Right;    Allergies Boniva [ibandronic acid]; Crestor [rosuvastatin calcium]; Exelon [rivastigmine tartrate]; Fosamax [alendronate sodium]; Lipitor [atorvastatin]; Morphine and related; and Vagifem [estradiol]  Social History Social History  Substance Use Topics  . Smoking status: Never Smoker  . Smokeless tobacco:  Never Used  . Alcohol use No    Review of Systems Constitutional: Negative for fever. Eyes: Negative for vision changes ENT:  Negative for congestion, sore throat Cardiovascular: Negative for chest pain. Respiratory: Negative for shortness of breath. Gastrointestinal: Negative for abdominal pain, vomiting and diarrhea. Genitourinary: Negative for dysuria. Musculoskeletal: Negative for back pain. Skin: Negative for rash. Neurological: Negative for headaches, focal weakness or numbness.  All systems negative/normal/unremarkable except as stated in the HPI  ____________________________________________   PHYSICAL EXAM:  VITAL SIGNS: ED Triage Vitals  Enc Vitals Group     BP      Pulse      Resp      Temp      Temp src      SpO2      Weight      Height      Head Circumference      Peak Flow      Pain Score      Pain Loc      Pain Edu?      Excl. in [place]?     Constitutional: Alert but disoriented, Well appearing and in no distress. Eyes: Conjunctivae are normal. Normal extraocular movements. ENT   Head: Normocephalic and atraumatic.   Nose: No congestion/rhinnorhea.   Mouth/Throat: Mucous membranes are moist.   Neck: No stridor. Cardiovascular: Normal rate, regular rhythm. No murmurs, rubs, or gallops. Respiratory: Normal respiratory effort without tachypnea nor retractions. Breath sounds are clear and equal bilaterally. No wheezes/rales/rhonchi. Gastrointestinal: Soft and nontender. Normal bowel sounds  Musculoskeletal: Nontender with normal range of motion in extremities. No lower extremity tenderness nor edema. Neurologic:  Normal speech and language. No gross focal neurologic deficits are appreciated.  Skin:  Skin is warm, dry and intact. No rash noted. Psychiatric: Mood and affect are normal. Speech and behavior are normal.  ____________________________________________  EKG: Interpreted by me.NSR with a rate of 88 bpm, supraventricular bigeminy,  normal axis, normal QT  ____________________________________________  ED COURSE:  Pertinent labs & imaging results that were available during my care of the patient were reviewed by me and considered in my medical decision making (see chart for details). Patient presents for brief altered mental status, we will assess with labs and imaging as indicated. Clinical Course as of Jun 19 1122  Fri Jun 19, 2017  0832 Patient was able to ambulate without difficulty.  [JW]    Clinical Course User Index [JW] Earleen Newport, MD   Procedures ____________________________________________   LABS (pertinent positives/negatives)  Labs Reviewed  CBC WITH DIFFERENTIAL/PLATELET - Abnormal; Notable for the following:       Result Value   RBC 3.77 (*)    Hemoglobin 11.1 (*)    HCT 32.8 (*)    RDW 16.6 (*)    All other components within normal limits  COMPREHENSIVE METABOLIC PANEL - Abnormal; Notable for the following:    Calcium 8.8 (*)    Total Protein 6.3 (*)    All other components within normal limits  URINALYSIS, COMPLETE (UACMP) WITH MICROSCOPIC - Abnormal; Notable for the following:    Color, Urine YELLOW (*)    APPearance HAZY (*)    Squamous Epithelial / LPF 0-5 (*)    All other components within normal limits  TROPONIN I  GLUCOSE, CAPILLARY  CBG MONITORING, ED   ____________________________________________  FINAL ASSESSMENT AND PLAN  Altered mental status  Plan: Patient's labs and imaging were dictated above. Patient had presented for Transient altered mental status which may be secondary to fatigue or her falling asleep. She is ambulating here without any difficulty and we have not had any further episodes. She is stable for outpatient follow-up.   Earleen Newport, MD   Note: This note was generated in part or whole with voice recognition software. Voice recognition is usually quite accurate but there are transcription errors that can and very often do occur. I  apologize for any typographical errors that were not detected and corrected.     Earleen Newport, MD 06/19/17 [phone]

## 2017-06-26 DIAGNOSIS — I10 Essential (primary) hypertension: Secondary | ICD-10-CM | POA: Diagnosis not present

## 2017-06-26 DIAGNOSIS — M810 Age-related osteoporosis without current pathological fracture: Secondary | ICD-10-CM | POA: Diagnosis not present

## 2017-06-26 DIAGNOSIS — F0390 Unspecified dementia without behavioral disturbance: Secondary | ICD-10-CM | POA: Diagnosis not present

## 2017-06-26 DIAGNOSIS — E785 Hyperlipidemia, unspecified: Secondary | ICD-10-CM | POA: Diagnosis not present

## 2017-06-29 DIAGNOSIS — Z79899 Other long term (current) drug therapy: Secondary | ICD-10-CM | POA: Diagnosis not present

## 2017-06-29 DIAGNOSIS — E559 Vitamin D deficiency, unspecified: Secondary | ICD-10-CM | POA: Diagnosis not present

## 2017-06-29 DIAGNOSIS — D518 Other vitamin B12 deficiency anemias: Secondary | ICD-10-CM | POA: Diagnosis not present

## 2017-06-29 DIAGNOSIS — E038 Other specified hypothyroidism: Secondary | ICD-10-CM | POA: Diagnosis not present

## 2017-06-29 DIAGNOSIS — E119 Type 2 diabetes mellitus without complications: Secondary | ICD-10-CM | POA: Diagnosis not present

## 2017-06-29 DIAGNOSIS — E782 Mixed hyperlipidemia: Secondary | ICD-10-CM | POA: Diagnosis not present

## 2017-07-09 DIAGNOSIS — E038 Other specified hypothyroidism: Secondary | ICD-10-CM | POA: Diagnosis not present

## 2017-07-09 DIAGNOSIS — D518 Other vitamin B12 deficiency anemias: Secondary | ICD-10-CM | POA: Diagnosis not present

## 2017-07-09 DIAGNOSIS — E559 Vitamin D deficiency, unspecified: Secondary | ICD-10-CM | POA: Diagnosis not present

## 2017-07-09 DIAGNOSIS — Z79899 Other long term (current) drug therapy: Secondary | ICD-10-CM | POA: Diagnosis not present

## 2017-07-09 DIAGNOSIS — E782 Mixed hyperlipidemia: Secondary | ICD-10-CM | POA: Diagnosis not present

## 2017-07-09 DIAGNOSIS — E119 Type 2 diabetes mellitus without complications: Secondary | ICD-10-CM | POA: Diagnosis not present

## 2017-07-15 DIAGNOSIS — R4182 Altered mental status, unspecified: Secondary | ICD-10-CM | POA: Diagnosis not present

## 2017-07-17 DIAGNOSIS — R300 Dysuria: Secondary | ICD-10-CM | POA: Diagnosis not present

## 2017-07-17 DIAGNOSIS — E538 Deficiency of other specified B group vitamins: Secondary | ICD-10-CM | POA: Diagnosis not present

## 2017-07-31 DIAGNOSIS — F0390 Unspecified dementia without behavioral disturbance: Secondary | ICD-10-CM | POA: Diagnosis not present

## 2017-07-31 DIAGNOSIS — M810 Age-related osteoporosis without current pathological fracture: Secondary | ICD-10-CM | POA: Diagnosis not present

## 2017-07-31 DIAGNOSIS — E785 Hyperlipidemia, unspecified: Secondary | ICD-10-CM | POA: Diagnosis not present

## 2017-07-31 DIAGNOSIS — I10 Essential (primary) hypertension: Secondary | ICD-10-CM | POA: Diagnosis not present

## 2017-07-31 DIAGNOSIS — Z23 Encounter for immunization: Secondary | ICD-10-CM | POA: Diagnosis not present

## 2017-08-16 ENCOUNTER — Encounter: Payer: Self-pay | Admitting: *Deleted

## 2017-08-16 ENCOUNTER — Emergency Department: Payer: Medicare Other

## 2017-08-16 ENCOUNTER — Emergency Department
Admission: EM | Admit: 2017-08-16 | Discharge: 2017-08-16 | Disposition: A | Discharge status: Home / Self Care | Payer: Medicare Other | Attending: Emergency Medicine | Admitting: Emergency Medicine

## 2017-08-16 DIAGNOSIS — Y929 Unspecified place or not applicable: Secondary | ICD-10-CM | POA: Insufficient documentation

## 2017-08-16 DIAGNOSIS — F0390 Unspecified dementia without behavioral disturbance: Secondary | ICD-10-CM | POA: Diagnosis not present

## 2017-08-16 DIAGNOSIS — Z885 Allergy status to narcotic agent status: Secondary | ICD-10-CM | POA: Diagnosis not present

## 2017-08-16 DIAGNOSIS — Z7902 Long term (current) use of antithrombotics/antiplatelets: Secondary | ICD-10-CM | POA: Insufficient documentation

## 2017-08-16 DIAGNOSIS — I10 Essential (primary) hypertension: Secondary | ICD-10-CM | POA: Insufficient documentation

## 2017-08-16 DIAGNOSIS — S0990XA Unspecified injury of head, initial encounter: Secondary | ICD-10-CM | POA: Diagnosis not present

## 2017-08-16 DIAGNOSIS — Z79899 Other long term (current) drug therapy: Secondary | ICD-10-CM | POA: Diagnosis not present

## 2017-08-16 DIAGNOSIS — Y939 Activity, unspecified: Secondary | ICD-10-CM | POA: Insufficient documentation

## 2017-08-16 DIAGNOSIS — W19XXXA Unspecified fall, initial encounter: Secondary | ICD-10-CM | POA: Insufficient documentation

## 2017-08-16 DIAGNOSIS — Z8673 Personal history of transient ischemic attack (TIA), and cerebral infarction without residual deficits: Secondary | ICD-10-CM | POA: Diagnosis not present

## 2017-08-16 DIAGNOSIS — Y999 Unspecified external cause status: Secondary | ICD-10-CM | POA: Insufficient documentation

## 2017-08-16 DIAGNOSIS — R000 Tachycardia, unspecified: Secondary | ICD-10-CM | POA: Diagnosis not present

## 2017-08-16 MED ORDER — ALBUTEROL SULFATE (2.5 MG/3ML) 0.083% IN NEBU: 5.0000 mg | INHALATION_SOLUTION | Freq: Once | RESPIRATORY_TRACT | Status: DC

## 2017-08-16 NOTE — ED Notes (Signed)
Pt has dementia.  Pt confused on arrival   Pt from [facility]   Pt fell onto the floor while walking without her walker.   No obvious injury noted.  nsr on monitor.

## 2017-08-16 NOTE — ED Provider Notes (Addendum)
[facility] Emergency Department Provider Note  ____________________________________________   I have reviewed the triage vital signs and the nursing notes.   HISTORY  Chief Complaint Fall and Head Injury    HPI Cathy Johnson is a 83 y.o. female unfortunately suffers from dementia, states that she not recall what happened to her.  She is on Plavix, according to nursing home and EMS, patient is walking and had a witnessed non-syncopal fall.  She has no complaints.  #5 chart caveat, history limited by patient dementia    Past Medical History:  Diagnosis Date  . Arthritis   . Dementia   . Hypertension   . Osteoarthritis of right knee 05/02/2014  . PFO (patent foramen ovale)    PFO by bubble study 02/05/11 TEE  . Stroke [facility])     Patient Active Problem List   Diagnosis Date Noted  . Facial droop 03/06/2017  . AKI (acute kidney injury) ([place]) 01/06/2016  . Syncope 01/06/2016  . Dementia 01/06/2016  . HTN (hypertension) 01/06/2016  . Anemia 01/06/2016  . Osteoarthritis of right knee 05/02/2014  . Knee osteoarthritis 05/02/2014    Past Surgical History:  Procedure Laterality Date  . BREAST SURGERY     LT BREAST MASS REMOVED  . CARPAL TUNNEL RELEASE    . KNEE ARTHROSCOPY     RIGHT  . PARTIAL KNEE ARTHROPLASTY Right 05/02/2014   Procedure: UNICOMPARTMENTAL KNEE;  Surgeon: Johnny Bridge, MD;  Location: [place];  Service: Orthopedics;  Laterality: Right;    Prior to Admission medications   Medication Sig Start Date End Date Taking? Authorizing Provider  acetaminophen (TYLENOL) 500 MG tablet Take 500 mg by mouth every 8 (eight) hours as needed for mild pain.    [provider]  amLODipine (NORVASC) 5 MG tablet Take 5 mg by mouth daily.    [provider]  aspirin 325 MG tablet Take 325 mg by mouth daily.    [provider]  atorvastatin (LIPITOR) 20 MG tablet Take 20 mg by mouth daily at 6 PM.     [provider]   clopidogrel (PLAVIX) 75 MG tablet Take 1 tablet (75 mg total) by mouth daily. 03/09/17   Gladstone Lighter, MD  divalproex (DEPAKOTE) 250 MG DR tablet Take 250 mg by mouth 3 (three) times daily.    [provider]  LORazepam (ATIVAN) 0.5 MG tablet Take 1 tablet (0.5 mg total) by mouth 2 (two) times daily. 03/08/17   Gladstone Lighter, MD  memantine (NAMENDA) 10 MG tablet Take 10 mg by mouth 2 (two) times daily.    [provider]  risperiDONE (RISPERDAL) 0.5 MG tablet Take 0.5 mg by mouth daily as needed.    [provider]  risperiDONE (RISPERDAL) 1 MG tablet Take 1 mg by mouth at bedtime.    [provider]  telmisartan (MICARDIS) 40 MG tablet Take 40 mg by mouth daily.     [provider]  vitamin B-12 (CYANOCOBALAMIN) 1000 MCG tablet Take 1,000 mcg by mouth daily.    [provider]    Allergies Boniva [ibandronic acid]; Crestor [rosuvastatin calcium]; Exelon [rivastigmine tartrate]; Fosamax [alendronate sodium]; Lipitor [atorvastatin]; Morphine and related; and Vagifem [estradiol]  No family history on file.  Social History Social History  Substance Use Topics  . Smoking status: Never Smoker  . Smokeless tobacco: Never Used  . Alcohol use No    Review of Systems Level 5 chart caveat, cannot obtain 2/2 pt dementia  ____________________________________________  PHYSICAL EXAM:  VITAL SIGNS: ED Triage Vitals  Enc Vitals Group     BP 08/16/17 1613 (!) 155/78     Pulse Rate 08/16/17 1613 (!) 102     Resp 08/16/17 1613 20     Temp 08/16/17 1613 98.7 F (37.1 C)     Temp Source 08/16/17 1613 Oral     SpO2 08/16/17 1613 96 %     Weight 08/16/17 1610 149 lb (67.6 kg)     Height 08/16/17 1610 5\' 3"  (1.6 m)     Head Circumference --      Peak Flow --      Pain Score --      Pain Loc --      Pain Edu? --      Excl. in [place]? --     Constitutional: Alert and oriented to name, unsure place or date, can tell me about her  childhood. Well appearing and in no acute distress. Eyes: Conjunctivae are normal Head: Atraumatic HEENT: No congestion/rhinnorhea. Mucous membranes are moist.  Oropharynx non-erythematous Neck:   Nontender with no meningismus, no masses, no stridor Cardiovascular: Normal rate, regular rhythm. Grossly normal heart sounds.  Good peripheral circulation. Respiratory: Normal respiratory effort.  No retractions. Lungs CTAB. Abdominal: Soft and nontender. No distention. No guarding no rebound Back:  There is no focal tenderness or step off.  there is no midline tenderness there are no lesions noted. there is no CVA tenderness Musculoskeletal: No lower extremity tenderness, no upper extremity tenderness. No joint effusions, no DVT signs strong distal pulses no edema Neurologic:  Normal speech and language. No gross focal neurologic deficits are appreciated.  Skin:  Skin is warm, dry and intact. No rash noted. Psychiatric: Mood and affect are normal. Speech and behavior are normal.  ____________________________________________   LABS (all labs ordered are listed, but only abnormal results are displayed)  Labs Reviewed - No data to display  Pertinent labs  results that were available during my care of the patient were reviewed by me and considered in my medical decision making (see chart for details). ____________________________________________  EKG  I personally interpreted any EKGs ordered by me or triage Sinus rhythm, rate 99 no acute ischemic change Axis. ____________________________________________  RADIOLOGY  Pertinent labs & imaging results that were available during my care of the patient were reviewed by me and considered in my medical decision making (see chart for details). If possible, patient and/or family made aware of any abnormal findings. ____________________________________________    PROCEDURES  Procedure(s) performed: None  Procedures  Critical Care performed:  None  ____________________________________________   INITIAL IMPRESSION / ASSESSMENT AND PLAN / ED COURSE  Pertinent labs & imaging results that were available during my care of the patient were reviewed by me and considered in my medical decision making (see chart for details).  She with a non-syncopal fall here today with no complaints or concerns, she is unfortunately suffering from dementia but is at her baseline we will obtain a CT scan of her head as well as the concern I do not think further workup is indicated at this time  ----------------------------------------- 5:29 PM on 08/16/2017 -----------------------------------------  She did no acute distress I did call her sons listed phone number no unanswered, I did leave a message that he should call me without any details given that I do not know exactly whose voicemail it was.  ----------------------------------------- 6:02 PM on 08/16/2017 -----------------------------------------  Is at her bedside, feels that she is at  her baseline, and agrees with no further intervention or management at this time, he is happy to take her back    ____________________________________________   FINAL CLINICAL IMPRESSION(S) / ED DIAGNOSES  Final diagnoses:  None      This chart was dictated using voice recognition software.  Despite best efforts to proofread,  errors can occur which can change meaning.      Schuyler Amor, MD 08/16/17 1648    Schuyler Amor, MD 08/16/17 1729    Schuyler Amor, MD 08/16/17 1745    Schuyler Amor, MD 08/16/17 [phone]

## 2017-08-16 NOTE — ED Triage Notes (Signed)
Pt brought in via ems from [facility]  Pt fell while walking without her walker onto the floor  No loc.  No lac Eduardo Osier  Pt alert.

## 2017-08-18 DIAGNOSIS — I739 Peripheral vascular disease, unspecified: Secondary | ICD-10-CM | POA: Diagnosis not present

## 2017-08-18 DIAGNOSIS — M79675 Pain in left toe(s): Secondary | ICD-10-CM | POA: Diagnosis not present

## 2017-08-18 DIAGNOSIS — B351 Tinea unguium: Secondary | ICD-10-CM | POA: Diagnosis not present

## 2017-08-18 DIAGNOSIS — L84 Corns and callosities: Secondary | ICD-10-CM | POA: Diagnosis not present

## 2017-08-21 DIAGNOSIS — R2681 Unsteadiness on feet: Secondary | ICD-10-CM | POA: Diagnosis not present

## 2017-08-21 DIAGNOSIS — F0390 Unspecified dementia without behavioral disturbance: Secondary | ICD-10-CM | POA: Diagnosis not present

## 2017-08-21 DIAGNOSIS — M170 Bilateral primary osteoarthritis of knee: Secondary | ICD-10-CM | POA: Diagnosis not present

## 2017-09-02 DIAGNOSIS — M6281 Muscle weakness (generalized): Secondary | ICD-10-CM | POA: Diagnosis not present

## 2017-09-02 DIAGNOSIS — M25561 Pain in right knee: Secondary | ICD-10-CM | POA: Diagnosis not present

## 2017-09-02 DIAGNOSIS — M25562 Pain in left knee: Secondary | ICD-10-CM | POA: Diagnosis not present

## 2017-09-02 DIAGNOSIS — R262 Difficulty in walking, not elsewhere classified: Secondary | ICD-10-CM | POA: Diagnosis not present

## 2017-09-04 DIAGNOSIS — F0390 Unspecified dementia without behavioral disturbance: Secondary | ICD-10-CM | POA: Diagnosis not present

## 2017-09-04 DIAGNOSIS — M170 Bilateral primary osteoarthritis of knee: Secondary | ICD-10-CM | POA: Diagnosis not present

## 2017-09-04 DIAGNOSIS — M6281 Muscle weakness (generalized): Secondary | ICD-10-CM | POA: Diagnosis not present

## 2017-09-04 DIAGNOSIS — R262 Difficulty in walking, not elsewhere classified: Secondary | ICD-10-CM | POA: Diagnosis not present

## 2017-09-04 DIAGNOSIS — M25562 Pain in left knee: Secondary | ICD-10-CM | POA: Diagnosis not present

## 2017-09-04 DIAGNOSIS — I10 Essential (primary) hypertension: Secondary | ICD-10-CM | POA: Diagnosis not present

## 2017-09-04 DIAGNOSIS — M25561 Pain in right knee: Secondary | ICD-10-CM | POA: Diagnosis not present

## 2017-09-04 DIAGNOSIS — M1990 Unspecified osteoarthritis, unspecified site: Secondary | ICD-10-CM | POA: Diagnosis not present

## 2017-09-07 DIAGNOSIS — M6281 Muscle weakness (generalized): Secondary | ICD-10-CM | POA: Diagnosis not present

## 2017-09-07 DIAGNOSIS — M25561 Pain in right knee: Secondary | ICD-10-CM | POA: Diagnosis not present

## 2017-09-07 DIAGNOSIS — M25562 Pain in left knee: Secondary | ICD-10-CM | POA: Diagnosis not present

## 2017-09-07 DIAGNOSIS — R262 Difficulty in walking, not elsewhere classified: Secondary | ICD-10-CM | POA: Diagnosis not present

## 2017-09-08 DIAGNOSIS — F0281 Dementia in other diseases classified elsewhere with behavioral disturbance: Secondary | ICD-10-CM | POA: Diagnosis not present

## 2017-09-15 DIAGNOSIS — Z0000 Encounter for general adult medical examination without abnormal findings: Secondary | ICD-10-CM | POA: Diagnosis not present

## 2017-09-22 ENCOUNTER — Emergency Department: Payer: Medicare Other

## 2017-09-22 ENCOUNTER — Emergency Department
Admission: EM | Admit: 2017-09-22 | Discharge: 2017-09-22 | Disposition: A | Discharge status: Home / Self Care | Payer: Medicare Other | Attending: Emergency Medicine | Admitting: Emergency Medicine

## 2017-09-22 ENCOUNTER — Other Ambulatory Visit: Payer: Self-pay

## 2017-09-22 DIAGNOSIS — R296 Repeated falls: Secondary | ICD-10-CM

## 2017-09-22 DIAGNOSIS — F0390 Unspecified dementia without behavioral disturbance: Secondary | ICD-10-CM | POA: Insufficient documentation

## 2017-09-22 DIAGNOSIS — D518 Other vitamin B12 deficiency anemias: Secondary | ICD-10-CM | POA: Diagnosis not present

## 2017-09-22 DIAGNOSIS — R4020 Unspecified coma: Secondary | ICD-10-CM | POA: Diagnosis not present

## 2017-09-22 DIAGNOSIS — Y92129 Unspecified place in nursing home as the place of occurrence of the external cause: Secondary | ICD-10-CM | POA: Diagnosis not present

## 2017-09-22 DIAGNOSIS — Z79899 Other long term (current) drug therapy: Secondary | ICD-10-CM | POA: Diagnosis not present

## 2017-09-22 DIAGNOSIS — S300XXA Contusion of lower back and pelvis, initial encounter: Secondary | ICD-10-CM

## 2017-09-22 DIAGNOSIS — Z7982 Long term (current) use of aspirin: Secondary | ICD-10-CM | POA: Diagnosis not present

## 2017-09-22 DIAGNOSIS — Y999 Unspecified external cause status: Secondary | ICD-10-CM | POA: Insufficient documentation

## 2017-09-22 DIAGNOSIS — W19XXXA Unspecified fall, initial encounter: Secondary | ICD-10-CM | POA: Insufficient documentation

## 2017-09-22 DIAGNOSIS — R102 Pelvic and perineal pain: Secondary | ICD-10-CM | POA: Diagnosis not present

## 2017-09-22 DIAGNOSIS — Z8673 Personal history of transient ischemic attack (TIA), and cerebral infarction without residual deficits: Secondary | ICD-10-CM | POA: Insufficient documentation

## 2017-09-22 DIAGNOSIS — Z743 Need for continuous supervision: Secondary | ICD-10-CM | POA: Diagnosis not present

## 2017-09-22 DIAGNOSIS — I10 Essential (primary) hypertension: Secondary | ICD-10-CM | POA: Diagnosis not present

## 2017-09-22 DIAGNOSIS — E559 Vitamin D deficiency, unspecified: Secondary | ICD-10-CM | POA: Diagnosis not present

## 2017-09-22 DIAGNOSIS — E119 Type 2 diabetes mellitus without complications: Secondary | ICD-10-CM | POA: Diagnosis not present

## 2017-09-22 DIAGNOSIS — S3993XA Unspecified injury of pelvis, initial encounter: Secondary | ICD-10-CM | POA: Diagnosis not present

## 2017-09-22 DIAGNOSIS — Z7902 Long term (current) use of antithrombotics/antiplatelets: Secondary | ICD-10-CM | POA: Insufficient documentation

## 2017-09-22 DIAGNOSIS — R4182 Altered mental status, unspecified: Secondary | ICD-10-CM | POA: Diagnosis not present

## 2017-09-22 DIAGNOSIS — Y939 Activity, unspecified: Secondary | ICD-10-CM | POA: Diagnosis not present

## 2017-09-22 DIAGNOSIS — S3992XA Unspecified injury of lower back, initial encounter: Secondary | ICD-10-CM | POA: Diagnosis present

## 2017-09-22 DIAGNOSIS — E038 Other specified hypothyroidism: Secondary | ICD-10-CM | POA: Diagnosis not present

## 2017-09-22 DIAGNOSIS — F05 Delirium due to known physiological condition: Secondary | ICD-10-CM | POA: Diagnosis not present

## 2017-09-22 DIAGNOSIS — E7849 Other hyperlipidemia: Secondary | ICD-10-CM | POA: Diagnosis not present

## 2017-09-22 LAB — CBC WITH DIFFERENTIAL/PLATELET
BASOS PCT: 0 %
Basophils Absolute: 0 10*3/uL (ref 0–0.1)
EOS ABS: 0.2 10*3/uL (ref 0–0.7)
Eosinophils Relative: 2 %
HEMATOCRIT: 31.3 % — AB (ref 35.0–47.0)
Hemoglobin: 10.3 g/dL — ABNORMAL LOW (ref 12.0–16.0)
Lymphocytes Relative: 17 %
Lymphs Abs: 1.3 10*3/uL (ref 1.0–3.6)
MCH: 29.7 pg (ref 26.0–34.0)
MCHC: 32.7 g/dL (ref 32.0–36.0)
MCV: 90.8 fL (ref 80.0–100.0)
MONO ABS: 0.8 10*3/uL (ref 0.2–0.9)
Monocytes Relative: 10 %
NEUTROS ABS: 5.4 10*3/uL (ref 1.4–6.5)
Neutrophils Relative %: 71 %
Platelets: 291 10*3/uL (ref 150–440)
RBC: 3.45 MIL/uL — ABNORMAL LOW (ref 3.80–5.20)
RDW: 16.7 % — AB (ref 11.5–14.5)
WBC: 7.8 10*3/uL (ref 3.6–11.0)

## 2017-09-22 LAB — URINALYSIS, COMPLETE (UACMP) WITH MICROSCOPIC
BILIRUBIN URINE: NEGATIVE
Bacteria, UA: NONE SEEN
GLUCOSE, UA: NEGATIVE mg/dL
Hgb urine dipstick: NEGATIVE
KETONES UR: NEGATIVE mg/dL
Leukocytes, UA: NEGATIVE
Nitrite: NEGATIVE
PROTEIN: NEGATIVE mg/dL
Specific Gravity, Urine: 1.018 (ref 1.005–1.030)
pH: 7 (ref 5.0–8.0)

## 2017-09-22 LAB — BASIC METABOLIC PANEL
ANION GAP: 9 (ref 5–15)
BUN: 20 mg/dL (ref 6–20)
CALCIUM: 8.6 mg/dL — AB (ref 8.9–10.3)
CO2: 24 mmol/L (ref 22–32)
CREATININE: 0.71 mg/dL (ref 0.44–1.00)
Chloride: 106 mmol/L (ref 101–111)
GLUCOSE: 92 mg/dL (ref 65–99)
Potassium: 3.7 mmol/L (ref 3.5–5.1)
Sodium: 139 mmol/L (ref 135–145)

## 2017-09-22 NOTE — Discharge Instructions (Addendum)
Exam and evaluation are reassuring in the emergency department today.  Return to emergency department for any new or worsening condition including change in mental status, fever, or any weakness or numbness or trouble breathing.

## 2017-09-22 NOTE — ED Notes (Signed)
Pt transported back to [facility] via [facility] at this time. VSS. Skin warm and dry. PAINAD score 2. Discharge instructions included in packet and with patient via EMS.

## 2017-09-22 NOTE — ED Notes (Signed)
This RN attempted to contact [facility] [phone] to get full story about patient. Phone kept ringing and ringing and no one answered; no voicemail to leave a message. Will try again shortly.

## 2017-09-22 NOTE — ED Triage Notes (Signed)
Pt to ER via [facility] from [facility]. EMS reports staff called them because pt was found on the floor. Staff states that she has had trouble with her knees, they arent sure if she fell or lowered herself to the floor. No deformities or abrasions seen.

## 2017-09-22 NOTE — ED Notes (Signed)
[place] area phone off the hook at this time. This RN will continue to try to get in touch with them.

## 2017-09-22 NOTE — ED Notes (Signed)
Attempted to call report again (third time) to [facility]; no response. Discharge instructions sent with patient via [facility].

## 2017-09-22 NOTE — ED Notes (Signed)
Patient transported to CT 

## 2017-09-22 NOTE — ED Provider Notes (Addendum)
[facility] Emergency Department Provider Note ____________________________________________   I have reviewed the triage vital signs and the triage nursing note.  HISTORY  Chief Complaint Fall   Historian Level 5 Caveat History Limited by dementia  HPI Cathy Johnson is a 83 y.o. female brought in by EMS, report from EMS from nursing home staff is that patient is in the dementia unit and was found in the floor.  Patient was not noted to have any injuries or complaining of any injuries, but because she was found in the floor she was sent over for evaluation.  Patient states she does not know what happened she does not know if she had a fall.  She does not know why she was on the floor.  She is not complain of any pain.   Past Medical History:  Diagnosis Date  . Arthritis   . Dementia   . Hypertension   . Osteoarthritis of right knee 05/02/2014  . PFO (patent foramen ovale)    PFO by bubble study 02/05/11 TEE  . Stroke [facility])     Patient Active Problem List   Diagnosis Date Noted  . Facial droop 03/06/2017  . AKI (acute kidney injury) ([place]) 01/06/2016  . Syncope 01/06/2016  . Dementia 01/06/2016  . HTN (hypertension) 01/06/2016  . Anemia 01/06/2016  . Osteoarthritis of right knee 05/02/2014  . Knee osteoarthritis 05/02/2014    Past Surgical History:  Procedure Laterality Date  . BREAST SURGERY     LT BREAST MASS REMOVED  . CARPAL TUNNEL RELEASE    . KNEE ARTHROSCOPY     RIGHT  . PARTIAL KNEE ARTHROPLASTY Right 05/02/2014   Procedure: UNICOMPARTMENTAL KNEE;  Surgeon: Johnny Bridge, MD;  Location: [place];  Service: Orthopedics;  Laterality: Right;    Prior to Admission medications   Medication Sig Start Date End Date Taking? Authorizing Provider  acetaminophen (TYLENOL) 650 MG CR tablet Take 1,300 mg by mouth every 8 (eight) hours as needed for pain.   Yes [provider]  aspirin 81 MG tablet Take 81 mg by mouth daily.    Yes  [provider]  atorvastatin (LIPITOR) 20 MG tablet Take 20 mg by mouth daily at 6 PM.    Yes [provider]  Cholecalciferol (VITAMIN D) 2000 units tablet Take 2,000 Units by mouth daily.   Yes [provider]  clopidogrel (PLAVIX) 75 MG tablet Take 1 tablet (75 mg total) by mouth daily. 03/09/17  Yes Gladstone Lighter, MD  divalproex (DEPAKOTE) 250 MG DR tablet Take 250 mg by mouth 2 (two) times daily.    Yes [provider]  levothyroxine (SYNTHROID, LEVOTHROID) 25 MCG tablet Take 25 mcg by mouth daily.   Yes [provider]  lisinopril (PRINIVIL,ZESTRIL) 5 MG tablet Take 5 mg by mouth daily.   Yes [provider]  LORazepam (ATIVAN) 0.5 MG tablet Take 1 tablet (0.5 mg total) by mouth 2 (two) times daily. Patient taking differently: Take 0.5 mg by mouth 2 (two) times daily as needed for anxiety.  03/08/17  Yes Gladstone Lighter, MD  memantine (NAMENDA) 10 MG tablet Take 10 mg by mouth 2 (two) times daily.   Yes [provider]  risperiDONE (RISPERDAL) 1 MG tablet Take 1 mg by mouth at bedtime.    Yes [provider]  sertraline (ZOLOFT) 50 MG tablet Take 50 mg by mouth daily.   Yes [provider]  telmisartan (MICARDIS) 40 MG tablet Take 40 mg  by mouth daily.    Yes [provider]  vitamin B-12 (CYANOCOBALAMIN) 500 MCG tablet Take 500 mcg by mouth daily.    Yes [provider]    Allergies  Allergen Reactions  . Boniva [Ibandronic Acid]   . Crestor [Rosuvastatin Calcium]   . Exelon [Rivastigmine Tartrate]   . Fosamax [Alendronate Sodium]   . Lipitor [Atorvastatin]   . Morphine And Related   . Vagifem [Estradiol]     No family history on file.  Social History Social History   Tobacco Use  . Smoking status: Never Smoker  . Smokeless tobacco: Never Used  Substance Use Topics  . Alcohol use: No  . Drug use: No    Review of Systems  Level 5 Caveat History Limited by  dementia  Denies headache, chest pain, abdominal pain ___________________________________________   PHYSICAL EXAM:  VITAL SIGNS: ED Triage Vitals  Enc Vitals Group     BP 09/22/17 0951 (!) 173/84     Pulse Rate 09/22/17 0951 79     Resp 09/22/17 0951 20     Temp 09/22/17 0951 (!) 97.5 F (36.4 C)     Temp Source 09/22/17 0951 Oral     SpO2 09/22/17 0951 97 %     Weight 09/22/17 0952 149 lb (67.6 kg)     Height 09/22/17 0952 5\' 6"  (1.676 m)     Head Circumference --      Peak Flow --      Pain Score --      Pain Loc --      Pain Edu? --      Excl. in [place]? --      Constitutional: Alert, but poor historian. Well appearing and in no distress. HEENT   Head: Normocephalic and atraumatic.      Eyes: Conjunctivae are normal. Pupils equal and round.       Ears:         Nose: No congestion/rhinnorhea.   Mouth/Throat: Mucous membranes are moist.   Neck: No stridor.  Tender C-spine to palpation or range of motion. Cardiovascular/Chest: Normal rate, regular rhythm.  No murmurs, rubs, or gallops. Respiratory: Normal respiratory effort without tachypnea nor retractions. Breath sounds are clear and equal bilaterally. No wheezes/rales/rhonchi. Gastrointestinal: Soft. No distention, no guarding, no rebound. Nontender.    Genitourinary/rectal:Deferred Musculoskeletal: No point tenderness to spine.  Left buttock bruising.  Nontender with normal range of motion in all extremities. No joint effusions.  No lower extremity tenderness.  No edema. Neurologic: No facial droop or slurred speech.  Poor historian.  Normal speech and language. No gross or focal neurologic deficits are appreciated. Skin:  Skin is warm, dry and intact. No rash noted. Psychiatric: No agitation.   ____________________________________________  LABS (pertinent positives/negatives) I, Lisa Roca, MD the attending physician have reviewed the labs noted below.  Labs Reviewed  URINALYSIS, COMPLETE (UACMP) WITH  MICROSCOPIC - Abnormal; Notable for the following components:      Result Value   Color, Urine YELLOW (*)    APPearance CLEAR (*)    Squamous Epithelial / LPF 0-5 (*)    All other components within normal limits  BASIC METABOLIC PANEL - Abnormal; Notable for the following components:   Calcium 8.6 (*)    All other components within normal limits  CBC WITH DIFFERENTIAL/PLATELET - Abnormal; Notable for the following components:   RBC 3.45 (*)    Hemoglobin 10.3 (*)    HCT 31.3 (*)    RDW 16.7 (*)  All other components within normal limits    ____________________________________________    EKG I, Lisa Roca, MD, the attending physician have personally viewed and interpreted all ECGs.  79 bpm.  Sinus arrhythmia.  Normal axis.  Nonspecific T wave ____________________________________________  RADIOLOGY All Xrays were viewed by me.  Imaging interpreted by Radiologist, and I, Lisa Roca, MD the attending physician have reviewed the radiologist interpretation noted below.  CT head without contrast:  IMPRESSION: Atrophy with chronic small vessel ischemic disease. No CT evidence for acute intracranial abnormality.  DG pelvis:  IMPRESSION: No acute abnormality.  CT pelvis without contrast:  IMPRESSION: Large subcutaneous hematoma in the medial left buttock. Negative for acute bony or joint abnormality.  Mild to moderate bilateral hip osteoarthritis. __________________________________________  PROCEDURES  Procedure(s) performed: None  Critical Care performed: None   ____________________________________________  ED COURSE / ASSESSMENT AND PLAN  Pertinent labs & imaging results that were available during my care of the patient were reviewed by me and considered in my medical decision making (see chart for details).   Patient apparently found in the floor, but without any clear evidence for traumatic injury.  Patient does not have any clear evidence of traumatic  injury on exam.  There is no report of altered mental status, but she is demented and can provide no history.  She is on aspirin and elderly, given this scenario, I will CT her head just to ensure that there is no findings there as her exam is relatively unreliable.  From the standpoint of neck pain, she does seem to feel tell me if she is in pain or not, and has no tenderness I am not suspicious of neck traumatic injury.  We will check screening laboratory studies while she is here including urinalysis and blood work plus EKG.  Given patient not really attempting to bear much weight, unclear whether or not this is just due to mental status with dementia, however imaged pelvis with x-ray and there is no focal findings, will obtain CT just to rule out any underlying occult fracture prior to discharge.   Chart indicated guardian, I called patient's emergency contact the son, and left a message to call back the emergency department, there is no answer and no return phone call over the course here in the ED.  Patient will be discharged back to her nursing facility after CT shows no underlying fracture, but the buttock hematoma.  DIFFERENTIAL DIAGNOSIS: Including but not limited to electrolyte disturbance, dehydration, mechanical fall, urinary tract infection, traumatic injuries, etc.  CONSULTATIONS: None  Patient / Family / Caregiver informed of clinical course, medical decision-making process, and agree with plan.   I discussed return precautions, follow-up instructions, and discharge instructions with patient and/or family.  Discharge Instructions : Exam and evaluation are reassuring in the emergency department today.  Return to emergency department for any new or worsening condition including change in mental status, fever, or any weakness or numbness or trouble breathing.    ___________________________________________   FINAL CLINICAL IMPRESSION(S) / ED DIAGNOSES   Final diagnoses:   Unwitnessed fall  Traumatic hematoma of buttock, initial encounter      ___________________________________________        Note: This dictation was prepared with Dragon dictation. Any transcriptional errors that result from this process are unintentional    Lisa Roca, MD 09/22/17 [place]    Lisa Roca, MD 09/22/17 [phone]

## 2017-09-22 NOTE — ED Notes (Signed)
Pt placed on bedpan to urinate.

## 2017-09-23 DIAGNOSIS — M25562 Pain in left knee: Secondary | ICD-10-CM | POA: Diagnosis not present

## 2017-09-23 DIAGNOSIS — M170 Bilateral primary osteoarthritis of knee: Secondary | ICD-10-CM | POA: Diagnosis not present

## 2017-09-23 DIAGNOSIS — M25561 Pain in right knee: Secondary | ICD-10-CM | POA: Diagnosis not present

## 2017-09-24 DIAGNOSIS — R262 Difficulty in walking, not elsewhere classified: Secondary | ICD-10-CM | POA: Diagnosis not present

## 2017-09-24 DIAGNOSIS — M25561 Pain in right knee: Secondary | ICD-10-CM | POA: Diagnosis not present

## 2017-09-24 DIAGNOSIS — M25562 Pain in left knee: Secondary | ICD-10-CM | POA: Diagnosis not present

## 2017-09-24 DIAGNOSIS — M6281 Muscle weakness (generalized): Secondary | ICD-10-CM | POA: Diagnosis not present

## 2017-09-25 DIAGNOSIS — R4182 Altered mental status, unspecified: Secondary | ICD-10-CM | POA: Diagnosis not present

## 2017-09-25 DIAGNOSIS — M1990 Unspecified osteoarthritis, unspecified site: Secondary | ICD-10-CM | POA: Diagnosis not present

## 2017-09-25 DIAGNOSIS — F0390 Unspecified dementia without behavioral disturbance: Secondary | ICD-10-CM | POA: Diagnosis not present

## 2017-09-25 DIAGNOSIS — R451 Restlessness and agitation: Secondary | ICD-10-CM | POA: Diagnosis not present

## 2017-10-01 DIAGNOSIS — R262 Difficulty in walking, not elsewhere classified: Secondary | ICD-10-CM | POA: Diagnosis not present

## 2017-10-01 DIAGNOSIS — M25561 Pain in right knee: Secondary | ICD-10-CM | POA: Diagnosis not present

## 2017-10-01 DIAGNOSIS — M6281 Muscle weakness (generalized): Secondary | ICD-10-CM | POA: Diagnosis not present

## 2017-10-01 DIAGNOSIS — M25562 Pain in left knee: Secondary | ICD-10-CM | POA: Diagnosis not present

## 2017-10-02 DIAGNOSIS — I10 Essential (primary) hypertension: Secondary | ICD-10-CM | POA: Diagnosis not present

## 2017-10-02 DIAGNOSIS — E785 Hyperlipidemia, unspecified: Secondary | ICD-10-CM | POA: Diagnosis not present

## 2017-10-02 DIAGNOSIS — F0390 Unspecified dementia without behavioral disturbance: Secondary | ICD-10-CM | POA: Diagnosis not present

## 2017-10-02 DIAGNOSIS — M1990 Unspecified osteoarthritis, unspecified site: Secondary | ICD-10-CM | POA: Diagnosis not present

## 2017-10-30 DIAGNOSIS — I10 Essential (primary) hypertension: Secondary | ICD-10-CM | POA: Diagnosis not present

## 2017-10-30 DIAGNOSIS — E785 Hyperlipidemia, unspecified: Secondary | ICD-10-CM | POA: Diagnosis not present

## 2017-10-30 DIAGNOSIS — B351 Tinea unguium: Secondary | ICD-10-CM | POA: Diagnosis not present

## 2017-10-30 DIAGNOSIS — L84 Corns and callosities: Secondary | ICD-10-CM | POA: Diagnosis not present

## 2017-10-30 DIAGNOSIS — I739 Peripheral vascular disease, unspecified: Secondary | ICD-10-CM | POA: Diagnosis not present

## 2017-10-30 DIAGNOSIS — M1990 Unspecified osteoarthritis, unspecified site: Secondary | ICD-10-CM | POA: Diagnosis not present

## 2017-10-30 DIAGNOSIS — M79675 Pain in left toe(s): Secondary | ICD-10-CM | POA: Diagnosis not present

## 2017-10-30 DIAGNOSIS — F0390 Unspecified dementia without behavioral disturbance: Secondary | ICD-10-CM | POA: Diagnosis not present

## 2017-11-10 DIAGNOSIS — R4182 Altered mental status, unspecified: Secondary | ICD-10-CM | POA: Diagnosis not present

## 2017-11-27 DIAGNOSIS — M1990 Unspecified osteoarthritis, unspecified site: Secondary | ICD-10-CM | POA: Diagnosis not present

## 2017-11-27 DIAGNOSIS — F0390 Unspecified dementia without behavioral disturbance: Secondary | ICD-10-CM | POA: Diagnosis not present

## 2017-11-27 DIAGNOSIS — E785 Hyperlipidemia, unspecified: Secondary | ICD-10-CM | POA: Diagnosis not present

## 2017-11-27 DIAGNOSIS — I10 Essential (primary) hypertension: Secondary | ICD-10-CM | POA: Diagnosis not present

## 2017-12-11 DIAGNOSIS — E785 Hyperlipidemia, unspecified: Secondary | ICD-10-CM | POA: Diagnosis not present

## 2017-12-11 DIAGNOSIS — I10 Essential (primary) hypertension: Secondary | ICD-10-CM | POA: Diagnosis not present

## 2017-12-11 DIAGNOSIS — F0390 Unspecified dementia without behavioral disturbance: Secondary | ICD-10-CM | POA: Diagnosis not present

## 2017-12-11 DIAGNOSIS — M1990 Unspecified osteoarthritis, unspecified site: Secondary | ICD-10-CM | POA: Diagnosis not present

## 2017-12-21 DIAGNOSIS — D518 Other vitamin B12 deficiency anemias: Secondary | ICD-10-CM | POA: Diagnosis not present

## 2017-12-21 DIAGNOSIS — E559 Vitamin D deficiency, unspecified: Secondary | ICD-10-CM | POA: Diagnosis not present

## 2017-12-21 DIAGNOSIS — E119 Type 2 diabetes mellitus without complications: Secondary | ICD-10-CM | POA: Diagnosis not present

## 2017-12-21 DIAGNOSIS — Z79899 Other long term (current) drug therapy: Secondary | ICD-10-CM | POA: Diagnosis not present

## 2017-12-21 DIAGNOSIS — E038 Other specified hypothyroidism: Secondary | ICD-10-CM | POA: Diagnosis not present

## 2017-12-21 DIAGNOSIS — E7849 Other hyperlipidemia: Secondary | ICD-10-CM | POA: Diagnosis not present

## 2017-12-25 DIAGNOSIS — I739 Peripheral vascular disease, unspecified: Secondary | ICD-10-CM | POA: Diagnosis not present

## 2017-12-25 DIAGNOSIS — E039 Hypothyroidism, unspecified: Secondary | ICD-10-CM | POA: Diagnosis not present

## 2017-12-25 DIAGNOSIS — E785 Hyperlipidemia, unspecified: Secondary | ICD-10-CM | POA: Diagnosis not present

## 2017-12-25 DIAGNOSIS — I10 Essential (primary) hypertension: Secondary | ICD-10-CM | POA: Diagnosis not present

## 2018-01-06 DIAGNOSIS — F0151 Vascular dementia with behavioral disturbance: Secondary | ICD-10-CM | POA: Diagnosis not present

## 2018-01-06 DIAGNOSIS — F339 Major depressive disorder, recurrent, unspecified: Secondary | ICD-10-CM | POA: Diagnosis not present

## 2018-01-13 DIAGNOSIS — F0151 Vascular dementia with behavioral disturbance: Secondary | ICD-10-CM | POA: Diagnosis not present

## 2018-01-13 DIAGNOSIS — D518 Other vitamin B12 deficiency anemias: Secondary | ICD-10-CM | POA: Diagnosis not present

## 2018-01-13 DIAGNOSIS — F339 Major depressive disorder, recurrent, unspecified: Secondary | ICD-10-CM | POA: Diagnosis not present

## 2018-01-13 DIAGNOSIS — E119 Type 2 diabetes mellitus without complications: Secondary | ICD-10-CM | POA: Diagnosis not present

## 2018-01-13 DIAGNOSIS — E7849 Other hyperlipidemia: Secondary | ICD-10-CM | POA: Diagnosis not present

## 2018-01-13 DIAGNOSIS — R4182 Altered mental status, unspecified: Secondary | ICD-10-CM | POA: Diagnosis not present

## 2018-01-13 DIAGNOSIS — Z79899 Other long term (current) drug therapy: Secondary | ICD-10-CM | POA: Diagnosis not present

## 2018-01-13 DIAGNOSIS — E559 Vitamin D deficiency, unspecified: Secondary | ICD-10-CM | POA: Diagnosis not present

## 2018-01-13 DIAGNOSIS — E038 Other specified hypothyroidism: Secondary | ICD-10-CM | POA: Diagnosis not present

## 2018-01-15 DIAGNOSIS — R5383 Other fatigue: Secondary | ICD-10-CM | POA: Diagnosis not present

## 2018-01-15 DIAGNOSIS — R451 Restlessness and agitation: Secondary | ICD-10-CM | POA: Diagnosis not present

## 2018-01-15 DIAGNOSIS — Z79899 Other long term (current) drug therapy: Secondary | ICD-10-CM | POA: Diagnosis not present

## 2018-01-15 DIAGNOSIS — E039 Hypothyroidism, unspecified: Secondary | ICD-10-CM | POA: Diagnosis not present

## 2018-01-15 DIAGNOSIS — F0151 Vascular dementia with behavioral disturbance: Secondary | ICD-10-CM | POA: Diagnosis not present

## 2018-01-20 DIAGNOSIS — R451 Restlessness and agitation: Secondary | ICD-10-CM | POA: Diagnosis not present

## 2018-01-20 DIAGNOSIS — F0151 Vascular dementia with behavioral disturbance: Secondary | ICD-10-CM | POA: Diagnosis not present

## 2018-01-20 DIAGNOSIS — F339 Major depressive disorder, recurrent, unspecified: Secondary | ICD-10-CM | POA: Diagnosis not present

## 2018-01-26 DIAGNOSIS — M170 Bilateral primary osteoarthritis of knee: Secondary | ICD-10-CM | POA: Diagnosis not present

## 2018-01-26 DIAGNOSIS — E039 Hypothyroidism, unspecified: Secondary | ICD-10-CM | POA: Diagnosis not present

## 2018-01-26 DIAGNOSIS — F0150 Vascular dementia without behavioral disturbance: Secondary | ICD-10-CM | POA: Diagnosis not present

## 2018-01-26 DIAGNOSIS — I739 Peripheral vascular disease, unspecified: Secondary | ICD-10-CM | POA: Diagnosis not present

## 2018-01-27 DIAGNOSIS — Z515 Encounter for palliative care: Secondary | ICD-10-CM | POA: Diagnosis not present

## 2018-01-27 DIAGNOSIS — F0390 Unspecified dementia without behavioral disturbance: Secondary | ICD-10-CM | POA: Diagnosis not present

## 2018-01-29 DIAGNOSIS — I10 Essential (primary) hypertension: Secondary | ICD-10-CM | POA: Diagnosis not present

## 2018-01-29 DIAGNOSIS — R05 Cough: Secondary | ICD-10-CM | POA: Diagnosis not present

## 2018-02-03 DIAGNOSIS — F0151 Vascular dementia with behavioral disturbance: Secondary | ICD-10-CM | POA: Diagnosis not present

## 2018-02-03 DIAGNOSIS — R451 Restlessness and agitation: Secondary | ICD-10-CM | POA: Diagnosis not present

## 2018-02-03 DIAGNOSIS — F339 Major depressive disorder, recurrent, unspecified: Secondary | ICD-10-CM | POA: Diagnosis not present

## 2018-02-12 DIAGNOSIS — S60212A Contusion of left wrist, initial encounter: Secondary | ICD-10-CM | POA: Diagnosis not present

## 2018-02-17 DIAGNOSIS — F0151 Vascular dementia with behavioral disturbance: Secondary | ICD-10-CM | POA: Diagnosis not present

## 2018-02-17 DIAGNOSIS — R4689 Other symptoms and signs involving appearance and behavior: Secondary | ICD-10-CM | POA: Diagnosis not present

## 2018-02-17 DIAGNOSIS — F339 Major depressive disorder, recurrent, unspecified: Secondary | ICD-10-CM | POA: Diagnosis not present

## 2018-02-19 DIAGNOSIS — I739 Peripheral vascular disease, unspecified: Secondary | ICD-10-CM | POA: Diagnosis not present

## 2018-02-19 DIAGNOSIS — B351 Tinea unguium: Secondary | ICD-10-CM | POA: Diagnosis not present

## 2018-02-19 DIAGNOSIS — L84 Corns and callosities: Secondary | ICD-10-CM | POA: Diagnosis not present

## 2018-02-19 DIAGNOSIS — M79675 Pain in left toe(s): Secondary | ICD-10-CM | POA: Diagnosis not present

## 2018-02-22 DIAGNOSIS — Z79899 Other long term (current) drug therapy: Secondary | ICD-10-CM | POA: Diagnosis not present

## 2018-02-22 DIAGNOSIS — E038 Other specified hypothyroidism: Secondary | ICD-10-CM | POA: Diagnosis not present

## 2018-02-24 DIAGNOSIS — F339 Major depressive disorder, recurrent, unspecified: Secondary | ICD-10-CM | POA: Diagnosis not present

## 2018-02-24 DIAGNOSIS — F0150 Vascular dementia without behavioral disturbance: Secondary | ICD-10-CM | POA: Diagnosis not present

## 2018-02-24 DIAGNOSIS — R4689 Other symptoms and signs involving appearance and behavior: Secondary | ICD-10-CM | POA: Diagnosis not present

## 2018-02-24 DIAGNOSIS — R451 Restlessness and agitation: Secondary | ICD-10-CM | POA: Diagnosis not present

## 2018-02-26 DIAGNOSIS — I10 Essential (primary) hypertension: Secondary | ICD-10-CM | POA: Diagnosis not present

## 2018-02-26 DIAGNOSIS — M810 Age-related osteoporosis without current pathological fracture: Secondary | ICD-10-CM | POA: Diagnosis not present

## 2018-02-26 DIAGNOSIS — F0151 Vascular dementia with behavioral disturbance: Secondary | ICD-10-CM | POA: Diagnosis not present

## 2018-02-26 DIAGNOSIS — E785 Hyperlipidemia, unspecified: Secondary | ICD-10-CM | POA: Diagnosis not present

## 2018-03-08 DIAGNOSIS — D518 Other vitamin B12 deficiency anemias: Secondary | ICD-10-CM | POA: Diagnosis not present

## 2018-03-08 DIAGNOSIS — E038 Other specified hypothyroidism: Secondary | ICD-10-CM | POA: Diagnosis not present

## 2018-03-08 DIAGNOSIS — E119 Type 2 diabetes mellitus without complications: Secondary | ICD-10-CM | POA: Diagnosis not present

## 2018-03-08 DIAGNOSIS — E782 Mixed hyperlipidemia: Secondary | ICD-10-CM | POA: Diagnosis not present

## 2018-03-24 DIAGNOSIS — F0390 Unspecified dementia without behavioral disturbance: Secondary | ICD-10-CM | POA: Diagnosis not present

## 2018-03-24 DIAGNOSIS — Z515 Encounter for palliative care: Secondary | ICD-10-CM | POA: Diagnosis not present

## 2018-03-26 DIAGNOSIS — F0151 Vascular dementia with behavioral disturbance: Secondary | ICD-10-CM | POA: Diagnosis not present

## 2018-03-26 DIAGNOSIS — M1990 Unspecified osteoarthritis, unspecified site: Secondary | ICD-10-CM | POA: Diagnosis not present

## 2018-03-26 DIAGNOSIS — E785 Hyperlipidemia, unspecified: Secondary | ICD-10-CM | POA: Diagnosis not present

## 2018-03-26 DIAGNOSIS — I10 Essential (primary) hypertension: Secondary | ICD-10-CM | POA: Diagnosis not present

## 2018-04-07 DIAGNOSIS — D518 Other vitamin B12 deficiency anemias: Secondary | ICD-10-CM | POA: Diagnosis not present

## 2018-04-07 DIAGNOSIS — E119 Type 2 diabetes mellitus without complications: Secondary | ICD-10-CM | POA: Diagnosis not present

## 2018-04-07 DIAGNOSIS — E038 Other specified hypothyroidism: Secondary | ICD-10-CM | POA: Diagnosis not present

## 2018-04-07 DIAGNOSIS — E7849 Other hyperlipidemia: Secondary | ICD-10-CM | POA: Diagnosis not present

## 2018-04-07 DIAGNOSIS — Z79899 Other long term (current) drug therapy: Secondary | ICD-10-CM | POA: Diagnosis not present

## 2018-04-07 DIAGNOSIS — E559 Vitamin D deficiency, unspecified: Secondary | ICD-10-CM | POA: Diagnosis not present

## 2018-04-08 DIAGNOSIS — E038 Other specified hypothyroidism: Secondary | ICD-10-CM | POA: Diagnosis not present

## 2018-04-08 DIAGNOSIS — D518 Other vitamin B12 deficiency anemias: Secondary | ICD-10-CM | POA: Diagnosis not present

## 2018-04-08 DIAGNOSIS — E782 Mixed hyperlipidemia: Secondary | ICD-10-CM | POA: Diagnosis not present

## 2018-04-08 DIAGNOSIS — E119 Type 2 diabetes mellitus without complications: Secondary | ICD-10-CM | POA: Diagnosis not present

## 2018-04-12 DIAGNOSIS — R0989 Other specified symptoms and signs involving the circulatory and respiratory systems: Secondary | ICD-10-CM | POA: Diagnosis not present

## 2018-04-12 DIAGNOSIS — I714 Abdominal aortic aneurysm, without rupture: Secondary | ICD-10-CM | POA: Diagnosis not present

## 2018-04-13 DIAGNOSIS — D492 Neoplasm of unspecified behavior of bone, soft tissue, and skin: Secondary | ICD-10-CM | POA: Diagnosis not present

## 2018-04-13 DIAGNOSIS — R011 Cardiac murmur, unspecified: Secondary | ICD-10-CM | POA: Diagnosis not present

## 2018-04-14 DIAGNOSIS — I739 Peripheral vascular disease, unspecified: Secondary | ICD-10-CM | POA: Diagnosis not present

## 2018-04-14 DIAGNOSIS — R221 Localized swelling, mass and lump, neck: Secondary | ICD-10-CM | POA: Diagnosis not present

## 2018-04-23 DIAGNOSIS — R451 Restlessness and agitation: Secondary | ICD-10-CM | POA: Diagnosis not present

## 2018-04-23 DIAGNOSIS — I10 Essential (primary) hypertension: Secondary | ICD-10-CM | POA: Diagnosis not present

## 2018-04-23 DIAGNOSIS — F0151 Vascular dementia with behavioral disturbance: Secondary | ICD-10-CM | POA: Diagnosis not present

## 2018-04-23 DIAGNOSIS — E039 Hypothyroidism, unspecified: Secondary | ICD-10-CM | POA: Diagnosis not present

## 2018-05-14 DIAGNOSIS — G309 Alzheimer's disease, unspecified: Secondary | ICD-10-CM | POA: Diagnosis not present

## 2018-05-14 DIAGNOSIS — Z7902 Long term (current) use of antithrombotics/antiplatelets: Secondary | ICD-10-CM | POA: Diagnosis not present

## 2018-05-14 DIAGNOSIS — Z8673 Personal history of transient ischemic attack (TIA), and cerebral infarction without residual deficits: Secondary | ICD-10-CM | POA: Diagnosis not present

## 2018-05-14 DIAGNOSIS — F329 Major depressive disorder, single episode, unspecified: Secondary | ICD-10-CM | POA: Diagnosis not present

## 2018-05-14 DIAGNOSIS — I10 Essential (primary) hypertension: Secondary | ICD-10-CM | POA: Diagnosis not present

## 2018-05-14 DIAGNOSIS — F0281 Dementia in other diseases classified elsewhere with behavioral disturbance: Secondary | ICD-10-CM | POA: Diagnosis not present

## 2018-05-16 ENCOUNTER — Emergency Department
Admission: EM | Admit: 2018-05-16 | Discharge: 2018-05-16 | Disposition: A | Discharge status: Home / Self Care | Payer: Medicare Other | Attending: Emergency Medicine | Admitting: Emergency Medicine

## 2018-05-16 ENCOUNTER — Other Ambulatory Visit: Payer: Self-pay

## 2018-05-16 ENCOUNTER — Emergency Department: Payer: Medicare Other

## 2018-05-16 ENCOUNTER — Encounter: Payer: Self-pay | Admitting: Emergency Medicine

## 2018-05-16 DIAGNOSIS — F0390 Unspecified dementia without behavioral disturbance: Secondary | ICD-10-CM | POA: Insufficient documentation

## 2018-05-16 DIAGNOSIS — Y92129 Unspecified place in nursing home as the place of occurrence of the external cause: Secondary | ICD-10-CM | POA: Insufficient documentation

## 2018-05-16 DIAGNOSIS — Z79899 Other long term (current) drug therapy: Secondary | ICD-10-CM | POA: Insufficient documentation

## 2018-05-16 DIAGNOSIS — W050XXA Fall from non-moving wheelchair, initial encounter: Secondary | ICD-10-CM | POA: Insufficient documentation

## 2018-05-16 DIAGNOSIS — S0990XA Unspecified injury of head, initial encounter: Secondary | ICD-10-CM | POA: Diagnosis not present

## 2018-05-16 DIAGNOSIS — Z7982 Long term (current) use of aspirin: Secondary | ICD-10-CM | POA: Diagnosis not present

## 2018-05-16 DIAGNOSIS — W19XXXA Unspecified fall, initial encounter: Secondary | ICD-10-CM

## 2018-05-16 DIAGNOSIS — Z7401 Bed confinement status: Secondary | ICD-10-CM | POA: Diagnosis not present

## 2018-05-16 DIAGNOSIS — Y9389 Activity, other specified: Secondary | ICD-10-CM | POA: Insufficient documentation

## 2018-05-16 DIAGNOSIS — Y998 Other external cause status: Secondary | ICD-10-CM | POA: Insufficient documentation

## 2018-05-16 DIAGNOSIS — Z7902 Long term (current) use of antithrombotics/antiplatelets: Secondary | ICD-10-CM | POA: Diagnosis not present

## 2018-05-16 DIAGNOSIS — S0101XA Laceration without foreign body of scalp, initial encounter: Secondary | ICD-10-CM | POA: Diagnosis not present

## 2018-05-16 DIAGNOSIS — I10 Essential (primary) hypertension: Secondary | ICD-10-CM | POA: Diagnosis not present

## 2018-05-16 DIAGNOSIS — S199XXA Unspecified injury of neck, initial encounter: Secondary | ICD-10-CM | POA: Diagnosis not present

## 2018-05-16 LAB — BASIC METABOLIC PANEL
ANION GAP: 6 (ref 5–15)
BUN: 22 mg/dL (ref 8–23)
CALCIUM: 9 mg/dL (ref 8.9–10.3)
CO2: 26 mmol/L (ref 22–32)
Chloride: 107 mmol/L (ref 98–111)
Creatinine, Ser: 0.97 mg/dL (ref 0.44–1.00)
GFR calc Af Amer: 59 mL/min — ABNORMAL LOW (ref >60)
GFR calc non Af Amer: 51 mL/min — ABNORMAL LOW (ref >60)
GLUCOSE: 108 mg/dL — AB (ref 70–99)
Potassium: 4 mmol/L (ref 3.5–5.1)
SODIUM: 139 mmol/L (ref 135–145)

## 2018-05-16 LAB — URINALYSIS, COMPLETE (UACMP) WITH MICROSCOPIC
BACTERIA UA: NONE SEEN
BILIRUBIN URINE: NEGATIVE
Glucose, UA: NEGATIVE mg/dL
Hgb urine dipstick: NEGATIVE
Ketones, ur: NEGATIVE mg/dL
Nitrite: NEGATIVE
Protein, ur: NEGATIVE mg/dL
SPECIFIC GRAVITY, URINE: 1.012 (ref 1.005–1.030)
pH: 5 (ref 5.0–8.0)

## 2018-05-16 LAB — CBC
HCT: 34.5 % — ABNORMAL LOW (ref 35.0–47.0)
Hemoglobin: 11.6 g/dL — ABNORMAL LOW (ref 12.0–16.0)
MCH: 29.5 pg (ref 26.0–34.0)
MCHC: 33.6 g/dL (ref 32.0–36.0)
MCV: 87.7 fL (ref 80.0–100.0)
PLATELETS: 270 10*3/uL (ref 150–440)
RBC: 3.93 MIL/uL (ref 3.80–5.20)
RDW: 16 % — AB (ref 11.5–14.5)
WBC: 8.7 10*3/uL (ref 3.6–11.0)

## 2018-05-16 LAB — GLUCOSE, CAPILLARY: GLUCOSE-CAPILLARY: 96 mg/dL (ref 70–99)

## 2018-05-16 MED ORDER — LISINOPRIL 5 MG PO TABS
5.0000 mg | ORAL_TABLET | Freq: Once | ORAL | Status: AC
  Administered 2018-05-16: 5 mg via ORAL
  Filled 2018-05-16: qty 1

## 2018-05-16 NOTE — ED Notes (Signed)
c-collar removed at this time. Small lac to back of head. No bleeding. Will clean with saline and Dr. Jacqualine Code stated he will dermabond lac.

## 2018-05-16 NOTE — ED Notes (Signed)
Called number for legal guardian, son Janace Decker. Called number in chart, spoke to his wife who gave cell phone number [phone], left voicemail stating pt would be DC. Wife told this RN that [name] is on his way to ED now.

## 2018-05-16 NOTE — ED Notes (Signed)
Called lab informing them blood had been sent prior to orders being placed. Stated they would run tests now.

## 2018-05-16 NOTE — ED Notes (Signed)
EMS at bedside to take pt back to [facility]. Called son to let him know that his mom is on her way back at this time.

## 2018-05-16 NOTE — ED Notes (Signed)
Son, [name], Legal guardian at bedside. Signed for DC. Stated that pt hasn't walked in past 6-7 months, has been in wheelchair. States advanced arthritis in legs. Hard to move legs. Stated ok to send pt back by EMS to [facility]. Spoke to son about pt medications. Informed given BP med here. Talked about pt anxiety meds, states facility has been working on doses for pt anxiety meds. Wants to be called when EMS arrives to take pt back so he can meet at [facility].

## 2018-05-16 NOTE — ED Provider Notes (Signed)
[facility] Emergency Department Provider Note   ____________________________________________   First MD Initiated Contact with Patient 05/16/18 [phone]     (approximate)  I have reviewed the triage vital signs and the nursing notes.   HISTORY  Chief Complaint Fall  EM caveat: Patient history of dementia, patient unable to provide history.  Called to attempt to reach patient's son, Gwyndolyn Saxon.  Left a message with outpatient identifying information and a callback number.  Have not heard back from him as of time of 725am.  HPI Cathy Johnson is a 84 y.o. female Per paramedics, the patient had an unwitnessed fall or fell from her wheelchair sometime in about 3 or 4 this morning.  She was noted to have a small laceration of the back of her head, was transported for further care and evaluation.  Patient herself does not have any complaint.  She seems hard of hearing, but does arouse to voice denies being any pain.  She is able to tell me her name, but otherwise unable to provide orientation.    Past Medical History:  Diagnosis Date  . Arthritis   . Dementia   . Hypertension   . Osteoarthritis of right knee 05/02/2014  . PFO (patent foramen ovale)    PFO by bubble study 02/05/11 TEE  . Stroke [facility])     Patient Active Problem List   Diagnosis Date Noted  . Facial droop 03/06/2017  . AKI (acute kidney injury) ([place]) 01/06/2016  . Syncope 01/06/2016  . Dementia 01/06/2016  . HTN (hypertension) 01/06/2016  . Anemia 01/06/2016  . Osteoarthritis of right knee 05/02/2014  . Knee osteoarthritis 05/02/2014    Past Surgical History:  Procedure Laterality Date  . BREAST SURGERY     LT BREAST MASS REMOVED  . CARPAL TUNNEL RELEASE    . KNEE ARTHROSCOPY     RIGHT  . PARTIAL KNEE ARTHROPLASTY Right 05/02/2014   Procedure: UNICOMPARTMENTAL KNEE;  Surgeon: Johnny Bridge, MD;  Location: [place];  Service: Orthopedics;  Laterality: Right;    Prior to Admission  medications   Medication Sig Start Date End Date Taking? Authorizing Provider  acetaminophen (TYLENOL) 650 MG CR tablet Take 1,300 mg by mouth every 8 (eight) hours as needed for pain.    [provider]  aspirin 81 MG tablet Take 81 mg by mouth daily.     [provider]  atorvastatin (LIPITOR) 20 MG tablet Take 20 mg by mouth daily at 6 PM.     [provider]  Cholecalciferol (VITAMIN D) 2000 units tablet Take 2,000 Units by mouth daily.    [provider]  clopidogrel (PLAVIX) 75 MG tablet Take 1 tablet (75 mg total) by mouth daily. 03/09/17   Gladstone Lighter, MD  divalproex (DEPAKOTE) 250 MG DR tablet Take 250 mg by mouth 2 (two) times daily.     [provider]  levothyroxine (SYNTHROID, LEVOTHROID) 25 MCG tablet Take 25 mcg by mouth daily.    [provider]  lisinopril (PRINIVIL,ZESTRIL) 5 MG tablet Take 5 mg by mouth daily.    [provider]  LORazepam (ATIVAN) 0.5 MG tablet Take 1 tablet (0.5 mg total) by mouth 2 (two) times daily. Patient taking differently: Take 0.5 mg by mouth 2 (two) times daily as needed for anxiety.  03/08/17   Gladstone Lighter, MD  memantine (NAMENDA) 10 MG tablet Take 10 mg by mouth 2 (two) times daily.    [provider]  risperiDONE (RISPERDAL)  1 MG tablet Take 1 mg by mouth at bedtime.     [provider]  sertraline (ZOLOFT) 50 MG tablet Take 50 mg by mouth daily.    [provider]  telmisartan (MICARDIS) 40 MG tablet Take 40 mg by mouth daily.     [provider]  vitamin B-12 (CYANOCOBALAMIN) 500 MCG tablet Take 500 mcg by mouth daily.     [provider]    Allergies Boniva [ibandronic acid]; Crestor [rosuvastatin calcium]; Exelon [rivastigmine tartrate]; Fosamax [alendronate sodium]; Lipitor [atorvastatin]; Morphine and related; and Vagifem [estradiol]  History reviewed. No pertinent family history.  Social History Social History    Tobacco Use  . Smoking status: Never Smoker  . Smokeless tobacco: Never Used  Substance Use Topics  . Alcohol use: No  . Drug use: No    Review of Systems EM caveat   ____________________________________________   PHYSICAL EXAM:  VITAL SIGNS: ED Triage Vitals  Enc Vitals Group     BP 05/16/18 0653 (!) 160/83     Pulse Rate 05/16/18 0653 91     Resp 05/16/18 0653 18     Temp 05/16/18 0653 98.3 F (36.8 C)     Temp Source 05/16/18 0653 Oral     SpO2 05/16/18 0653 95 %     Weight 05/16/18 0654 140 lb (63.5 kg)     Height 05/16/18 0654 5' (1.524 m)     Head Circumference --      Peak Flow --      Pain Score --      Pain Loc --      Pain Edu? --      Excl. in [place]? --     Constitutional: Resting with eyes closed, arousable to voice.  Seems slightly somnolent, but in no distress resting very comfortably. Eyes: Conjunctivae are normal. Head: Atraumatic set for approximately 1 cm small superficial dry no longer bleeding laceration in the left superior occipital region without foreign body or hematoma. Nose: No congestion/rhinnorhea. Mouth/Throat: Mucous membranes are moist. Neck: No stridor.  His mom no midline cervical tenderness.  Cervical collar is applied by ER staff. Cardiovascular: Normal rate, regular rhythm. Grossly normal heart sounds.  Good peripheral circulation. Respiratory: Normal respiratory effort.  No retractions. Lungs CTAB. Gastrointestinal: Soft and nontender. No distention. Musculoskeletal: No lower extremity tenderness nor edema.  No evidence of any bruising or injury noted on any of the extremities.  Able to range her extremities without pain or discomfort noted.  No gross abnormalities.  No contusions bruising or skin tears to noted. Neurologic: The patient opens her eyes, somnolent, but alerts to voice.  Hard of hearing.  Oriented to self only.  Speech is somewhat stuttered.  Skin:  Skin is warm, dry and intact. No rash noted. Psychiatric: Mood and  affect are calm.  Showing no agitation. ____________________________________________   LABS (all labs ordered are listed, but only abnormal results are displayed)  Labs Reviewed  CBC - Abnormal; Notable for the following components:      Result Value   Hemoglobin 11.6 (*)    HCT 34.5 (*)    RDW 16.0 (*)    All other components within normal limits  BASIC METABOLIC PANEL - Abnormal; Notable for the following components:   Glucose, Bld 108 (*)    GFR calc non Af Amer 51 (*)    GFR calc Af Amer 59 (*)    All other components within normal limits  URINALYSIS, COMPLETE (UACMP) WITH MICROSCOPIC -  Abnormal; Notable for the following components:   Color, Urine STRAW (*)    APPearance CLEAR (*)    Leukocytes, UA TRACE (*)    All other components within normal limits  URINE CULTURE  GLUCOSE, CAPILLARY  CBG MONITORING, ED   ____________________________________________  EKG  Reviewed and entered by me at 7 AM Heart rate 90 QRS 90 QTc 460 Normal sinus rhythm, no evidence of acute ischemia. ____________________________________________  RADIOLOGY  CT scan results of the head and cervical spine reviewed by me.     ____________________________________________   PROCEDURES  Procedure(s) performed: laceration  .Marland KitchenLaceration Repair Date/Time: 05/16/2018 9:37 AM Performed by: Delman Kitten, MD Authorized by: Delman Kitten, MD   Consent:    Consent obtained:  Verbal   Consent given by:  Patient Anesthesia (see MAR for exact dosages):    Anesthesia method:  None Laceration details:    Location:  Scalp   Scalp location:  Occipital   Length (cm):  1   Depth (mm):  3 Repair type:    Repair type:  Simple Exploration:    Contaminated: no   Treatment:    Amount of cleaning:  Standard   Irrigation solution:  Sterile saline   Visualized foreign bodies/material removed: no   Skin repair:    Repair method:  Tissue adhesive Approximation:    Approximation:  Close Post-procedure  details:    Dressing:  Open (no dressing)   Patient tolerance of procedure:  Tolerated well, no immediate complications    Critical Care performed: No  ____________________________________________   INITIAL IMPRESSION / ASSESSMENT AND PLAN / ED COURSE  Pertinent labs & imaging results that were available during my care of the patient were reviewed by me and considered in my medical decision making (see chart for details).  Reported unwitnessed fall from wheelchair.  Patient resting, somnolent but arousable and oriented to her name.  Somewhat stuttered speech, but does have a previous history of denoted stroke in her chart.  Also notable dementia.  Do not believe there is an acute neurologic event, rather what she reported is that she had a fall from wheelchair with a laceration to the back of her head.  Vital signs are normal except for hypertension.  Plan to obtain a broad work-up given the unknown circumstance of the fall, including CT head and cervical spine, CBC BMP and urinalysis.  EKG reassuring.  Patient denying any pain without any evidence of obvious long bone or spinal injury.  Denies any pain or discomfort at this time.  Clinical Course as of May 16 1144  Sun May 16, 2018  0811 Reexam, resting comfortably.  Logrolled and examined back, no lacerations or injuries to noted.  No bleeding noted on the scalp, plan to take c-collar off of CT scan negative for fracture.   [MQ]    Clinical Course User Index [MQ] Delman Kitten, MD    Bp elevated, patient resting comfortably, denies headache or any symptoms.  Like evaluation repaired.  Await family arrival reports are coming from [place].  Will give her home dose of lisinopril, hospital does not have her mycardis on formulary. ____________________________________________   FINAL CLINICAL IMPRESSION(S) / ED DIAGNOSES  Final diagnoses:  Fall, initial encounter  Closed head injury, initial encounter  Scalp laceration, initial  encounter      NEW MEDICATIONS STARTED DURING THIS VISIT:  New Prescriptions   No medications on file     Note:  This document was prepared using Dragon voice recognition software  and may include unintentional dictation errors.   ----------------------------------------- 11:45 AM on 05/16/2018 -----------------------------------------  Patient's son is arrived at bedside, agreeable with plan to discharge her back to her care facility.  Reports she has up-and-down days, she has not ambulated for about 6 months now.  She appears to be at her normal, and he reports her speech changes chronic after her previous stroke.  We will discharge her, patient is nonambulatory son reports unable to take her home which is very reasonable given her nonambulatory status.  Will discharge back to her care home.     Delman Kitten, MD 05/16/18 1147

## 2018-05-16 NOTE — ED Triage Notes (Signed)
EMS pt to [facility] from home with report of unwitnessed fall from a wheelchair. Pt has dementia and is on blood thinners. Pt also has a small lac to the back of her head with bleeding controlled.

## 2018-05-16 NOTE — ED Notes (Signed)
Son called asking about pt update. Stated he would be on his way later.

## 2018-05-16 NOTE — ED Notes (Signed)
C-collar placed per Dr. Jacqualine Code request.

## 2018-05-17 LAB — URINE CULTURE
CULTURE: NO GROWTH
Special Requests: NORMAL

## 2018-05-19 DIAGNOSIS — F329 Major depressive disorder, single episode, unspecified: Secondary | ICD-10-CM | POA: Diagnosis not present

## 2018-05-19 DIAGNOSIS — F0281 Dementia in other diseases classified elsewhere with behavioral disturbance: Secondary | ICD-10-CM | POA: Diagnosis not present

## 2018-05-19 DIAGNOSIS — G309 Alzheimer's disease, unspecified: Secondary | ICD-10-CM | POA: Diagnosis not present

## 2018-05-19 DIAGNOSIS — I10 Essential (primary) hypertension: Secondary | ICD-10-CM | POA: Diagnosis not present

## 2018-05-19 DIAGNOSIS — Z7902 Long term (current) use of antithrombotics/antiplatelets: Secondary | ICD-10-CM | POA: Diagnosis not present

## 2018-05-19 DIAGNOSIS — Z8673 Personal history of transient ischemic attack (TIA), and cerebral infarction without residual deficits: Secondary | ICD-10-CM | POA: Diagnosis not present

## 2018-05-21 DIAGNOSIS — F0281 Dementia in other diseases classified elsewhere with behavioral disturbance: Secondary | ICD-10-CM | POA: Diagnosis not present

## 2018-05-21 DIAGNOSIS — I10 Essential (primary) hypertension: Secondary | ICD-10-CM | POA: Diagnosis not present

## 2018-05-21 DIAGNOSIS — Z8673 Personal history of transient ischemic attack (TIA), and cerebral infarction without residual deficits: Secondary | ICD-10-CM | POA: Diagnosis not present

## 2018-05-21 DIAGNOSIS — F329 Major depressive disorder, single episode, unspecified: Secondary | ICD-10-CM | POA: Diagnosis not present

## 2018-05-21 DIAGNOSIS — Z7902 Long term (current) use of antithrombotics/antiplatelets: Secondary | ICD-10-CM | POA: Diagnosis not present

## 2018-05-21 DIAGNOSIS — G309 Alzheimer's disease, unspecified: Secondary | ICD-10-CM | POA: Diagnosis not present

## 2018-05-24 DIAGNOSIS — Z7902 Long term (current) use of antithrombotics/antiplatelets: Secondary | ICD-10-CM | POA: Diagnosis not present

## 2018-05-24 DIAGNOSIS — F329 Major depressive disorder, single episode, unspecified: Secondary | ICD-10-CM | POA: Diagnosis not present

## 2018-05-24 DIAGNOSIS — I10 Essential (primary) hypertension: Secondary | ICD-10-CM | POA: Diagnosis not present

## 2018-05-24 DIAGNOSIS — Z8673 Personal history of transient ischemic attack (TIA), and cerebral infarction without residual deficits: Secondary | ICD-10-CM | POA: Diagnosis not present

## 2018-05-24 DIAGNOSIS — G309 Alzheimer's disease, unspecified: Secondary | ICD-10-CM | POA: Diagnosis not present

## 2018-05-24 DIAGNOSIS — F0281 Dementia in other diseases classified elsewhere with behavioral disturbance: Secondary | ICD-10-CM | POA: Diagnosis not present

## 2018-05-26 DIAGNOSIS — F0151 Vascular dementia with behavioral disturbance: Secondary | ICD-10-CM | POA: Diagnosis not present

## 2018-05-26 DIAGNOSIS — F329 Major depressive disorder, single episode, unspecified: Secondary | ICD-10-CM | POA: Diagnosis not present

## 2018-05-26 DIAGNOSIS — F419 Anxiety disorder, unspecified: Secondary | ICD-10-CM | POA: Diagnosis not present

## 2018-05-28 DIAGNOSIS — F329 Major depressive disorder, single episode, unspecified: Secondary | ICD-10-CM | POA: Diagnosis not present

## 2018-05-28 DIAGNOSIS — I10 Essential (primary) hypertension: Secondary | ICD-10-CM | POA: Diagnosis not present

## 2018-05-28 DIAGNOSIS — F0281 Dementia in other diseases classified elsewhere with behavioral disturbance: Secondary | ICD-10-CM | POA: Diagnosis not present

## 2018-05-28 DIAGNOSIS — G309 Alzheimer's disease, unspecified: Secondary | ICD-10-CM | POA: Diagnosis not present

## 2018-06-01 DIAGNOSIS — Z8673 Personal history of transient ischemic attack (TIA), and cerebral infarction without residual deficits: Secondary | ICD-10-CM | POA: Diagnosis not present

## 2018-06-01 DIAGNOSIS — Z7902 Long term (current) use of antithrombotics/antiplatelets: Secondary | ICD-10-CM | POA: Diagnosis not present

## 2018-06-01 DIAGNOSIS — I10 Essential (primary) hypertension: Secondary | ICD-10-CM | POA: Diagnosis not present

## 2018-06-01 DIAGNOSIS — F329 Major depressive disorder, single episode, unspecified: Secondary | ICD-10-CM | POA: Diagnosis not present

## 2018-06-01 DIAGNOSIS — G309 Alzheimer's disease, unspecified: Secondary | ICD-10-CM | POA: Diagnosis not present

## 2018-06-01 DIAGNOSIS — F0281 Dementia in other diseases classified elsewhere with behavioral disturbance: Secondary | ICD-10-CM | POA: Diagnosis not present

## 2018-06-02 DIAGNOSIS — D518 Other vitamin B12 deficiency anemias: Secondary | ICD-10-CM | POA: Diagnosis not present

## 2018-06-02 DIAGNOSIS — E782 Mixed hyperlipidemia: Secondary | ICD-10-CM | POA: Diagnosis not present

## 2018-06-02 DIAGNOSIS — E038 Other specified hypothyroidism: Secondary | ICD-10-CM | POA: Diagnosis not present

## 2018-06-02 DIAGNOSIS — E119 Type 2 diabetes mellitus without complications: Secondary | ICD-10-CM | POA: Diagnosis not present

## 2018-06-07 DIAGNOSIS — F329 Major depressive disorder, single episode, unspecified: Secondary | ICD-10-CM | POA: Diagnosis not present

## 2018-06-07 DIAGNOSIS — G309 Alzheimer's disease, unspecified: Secondary | ICD-10-CM | POA: Diagnosis not present

## 2018-06-07 DIAGNOSIS — Z7902 Long term (current) use of antithrombotics/antiplatelets: Secondary | ICD-10-CM | POA: Diagnosis not present

## 2018-06-07 DIAGNOSIS — I10 Essential (primary) hypertension: Secondary | ICD-10-CM | POA: Diagnosis not present

## 2018-06-07 DIAGNOSIS — F0281 Dementia in other diseases classified elsewhere with behavioral disturbance: Secondary | ICD-10-CM | POA: Diagnosis not present

## 2018-06-07 DIAGNOSIS — Z8673 Personal history of transient ischemic attack (TIA), and cerebral infarction without residual deficits: Secondary | ICD-10-CM | POA: Diagnosis not present

## 2018-06-10 DIAGNOSIS — I739 Peripheral vascular disease, unspecified: Secondary | ICD-10-CM | POA: Diagnosis not present

## 2018-06-10 DIAGNOSIS — B351 Tinea unguium: Secondary | ICD-10-CM | POA: Diagnosis not present

## 2018-06-10 DIAGNOSIS — M79675 Pain in left toe(s): Secondary | ICD-10-CM | POA: Diagnosis not present

## 2018-06-10 DIAGNOSIS — L84 Corns and callosities: Secondary | ICD-10-CM | POA: Diagnosis not present

## 2018-06-15 DIAGNOSIS — F0281 Dementia in other diseases classified elsewhere with behavioral disturbance: Secondary | ICD-10-CM | POA: Diagnosis not present

## 2018-06-15 DIAGNOSIS — I10 Essential (primary) hypertension: Secondary | ICD-10-CM | POA: Diagnosis not present

## 2018-06-15 DIAGNOSIS — Z8673 Personal history of transient ischemic attack (TIA), and cerebral infarction without residual deficits: Secondary | ICD-10-CM | POA: Diagnosis not present

## 2018-06-15 DIAGNOSIS — F329 Major depressive disorder, single episode, unspecified: Secondary | ICD-10-CM | POA: Diagnosis not present

## 2018-06-15 DIAGNOSIS — Z7902 Long term (current) use of antithrombotics/antiplatelets: Secondary | ICD-10-CM | POA: Diagnosis not present

## 2018-06-15 DIAGNOSIS — G309 Alzheimer's disease, unspecified: Secondary | ICD-10-CM | POA: Diagnosis not present

## 2018-06-18 DIAGNOSIS — I739 Peripheral vascular disease, unspecified: Secondary | ICD-10-CM | POA: Diagnosis not present

## 2018-06-18 DIAGNOSIS — E785 Hyperlipidemia, unspecified: Secondary | ICD-10-CM | POA: Diagnosis not present

## 2018-06-18 DIAGNOSIS — I10 Essential (primary) hypertension: Secondary | ICD-10-CM | POA: Diagnosis not present

## 2018-06-18 DIAGNOSIS — F0151 Vascular dementia with behavioral disturbance: Secondary | ICD-10-CM | POA: Diagnosis not present

## 2018-06-26 DIAGNOSIS — Z7902 Long term (current) use of antithrombotics/antiplatelets: Secondary | ICD-10-CM | POA: Diagnosis not present

## 2018-06-26 DIAGNOSIS — I10 Essential (primary) hypertension: Secondary | ICD-10-CM | POA: Diagnosis not present

## 2018-06-26 DIAGNOSIS — Z8673 Personal history of transient ischemic attack (TIA), and cerebral infarction without residual deficits: Secondary | ICD-10-CM | POA: Diagnosis not present

## 2018-06-26 DIAGNOSIS — F0281 Dementia in other diseases classified elsewhere with behavioral disturbance: Secondary | ICD-10-CM | POA: Diagnosis not present

## 2018-06-26 DIAGNOSIS — G309 Alzheimer's disease, unspecified: Secondary | ICD-10-CM | POA: Diagnosis not present

## 2018-06-26 DIAGNOSIS — F329 Major depressive disorder, single episode, unspecified: Secondary | ICD-10-CM | POA: Diagnosis not present

## 2018-07-01 DIAGNOSIS — R05 Cough: Secondary | ICD-10-CM | POA: Diagnosis not present

## 2018-07-07 DIAGNOSIS — E038 Other specified hypothyroidism: Secondary | ICD-10-CM | POA: Diagnosis not present

## 2018-07-07 DIAGNOSIS — E119 Type 2 diabetes mellitus without complications: Secondary | ICD-10-CM | POA: Diagnosis not present

## 2018-07-07 DIAGNOSIS — D518 Other vitamin B12 deficiency anemias: Secondary | ICD-10-CM | POA: Diagnosis not present

## 2018-07-07 DIAGNOSIS — E7849 Other hyperlipidemia: Secondary | ICD-10-CM | POA: Diagnosis not present

## 2018-07-07 DIAGNOSIS — Z79899 Other long term (current) drug therapy: Secondary | ICD-10-CM | POA: Diagnosis not present

## 2018-07-07 DIAGNOSIS — E559 Vitamin D deficiency, unspecified: Secondary | ICD-10-CM | POA: Diagnosis not present

## 2018-07-09 DIAGNOSIS — R05 Cough: Secondary | ICD-10-CM | POA: Diagnosis not present

## 2018-07-16 DIAGNOSIS — F0390 Unspecified dementia without behavioral disturbance: Secondary | ICD-10-CM | POA: Diagnosis not present

## 2018-07-16 DIAGNOSIS — D649 Anemia, unspecified: Secondary | ICD-10-CM | POA: Diagnosis not present

## 2018-07-16 DIAGNOSIS — M810 Age-related osteoporosis without current pathological fracture: Secondary | ICD-10-CM | POA: Diagnosis not present

## 2018-07-16 DIAGNOSIS — I10 Essential (primary) hypertension: Secondary | ICD-10-CM | POA: Diagnosis not present

## 2018-07-19 DIAGNOSIS — E119 Type 2 diabetes mellitus without complications: Secondary | ICD-10-CM | POA: Diagnosis not present

## 2018-07-19 DIAGNOSIS — D518 Other vitamin B12 deficiency anemias: Secondary | ICD-10-CM | POA: Diagnosis not present

## 2018-07-19 DIAGNOSIS — E038 Other specified hypothyroidism: Secondary | ICD-10-CM | POA: Diagnosis not present

## 2018-07-19 DIAGNOSIS — E782 Mixed hyperlipidemia: Secondary | ICD-10-CM | POA: Diagnosis not present

## 2018-07-20 ENCOUNTER — Emergency Department: Payer: Medicare Other

## 2018-07-20 ENCOUNTER — Other Ambulatory Visit: Payer: Self-pay

## 2018-07-20 ENCOUNTER — Encounter: Payer: Self-pay | Admitting: Emergency Medicine

## 2018-07-20 ENCOUNTER — Observation Stay
Admission: EM | Admit: 2018-07-20 | Discharge: 2018-07-22 | Disposition: A | Discharge status: Skilled Nursing Facility | Payer: Medicare Other | Attending: Internal Medicine | Admitting: Internal Medicine

## 2018-07-20 DIAGNOSIS — Z96653 Presence of artificial knee joint, bilateral: Secondary | ICD-10-CM | POA: Insufficient documentation

## 2018-07-20 DIAGNOSIS — I499 Cardiac arrhythmia, unspecified: Secondary | ICD-10-CM | POA: Diagnosis not present

## 2018-07-20 DIAGNOSIS — R739 Hyperglycemia, unspecified: Secondary | ICD-10-CM | POA: Insufficient documentation

## 2018-07-20 DIAGNOSIS — M1990 Unspecified osteoarthritis, unspecified site: Secondary | ICD-10-CM | POA: Diagnosis not present

## 2018-07-20 DIAGNOSIS — R4182 Altered mental status, unspecified: Principal | ICD-10-CM | POA: Diagnosis present

## 2018-07-20 DIAGNOSIS — G309 Alzheimer's disease, unspecified: Secondary | ICD-10-CM | POA: Diagnosis not present

## 2018-07-20 DIAGNOSIS — D649 Anemia, unspecified: Secondary | ICD-10-CM | POA: Insufficient documentation

## 2018-07-20 DIAGNOSIS — Q211 Atrial septal defect: Secondary | ICD-10-CM | POA: Diagnosis not present

## 2018-07-20 DIAGNOSIS — Z888 Allergy status to other drugs, medicaments and biological substances status: Secondary | ICD-10-CM | POA: Diagnosis not present

## 2018-07-20 DIAGNOSIS — Z8673 Personal history of transient ischemic attack (TIA), and cerebral infarction without residual deficits: Secondary | ICD-10-CM | POA: Insufficient documentation

## 2018-07-20 DIAGNOSIS — M1711 Unilateral primary osteoarthritis, right knee: Secondary | ICD-10-CM | POA: Insufficient documentation

## 2018-07-20 DIAGNOSIS — F0280 Dementia in other diseases classified elsewhere without behavioral disturbance: Secondary | ICD-10-CM | POA: Insufficient documentation

## 2018-07-20 DIAGNOSIS — Z885 Allergy status to narcotic agent status: Secondary | ICD-10-CM | POA: Diagnosis not present

## 2018-07-20 DIAGNOSIS — R4020 Unspecified coma: Secondary | ICD-10-CM | POA: Diagnosis not present

## 2018-07-20 DIAGNOSIS — I10 Essential (primary) hypertension: Secondary | ICD-10-CM | POA: Diagnosis not present

## 2018-07-20 DIAGNOSIS — Z79899 Other long term (current) drug therapy: Secondary | ICD-10-CM | POA: Insufficient documentation

## 2018-07-20 DIAGNOSIS — R05 Cough: Secondary | ICD-10-CM | POA: Diagnosis not present

## 2018-07-20 DIAGNOSIS — I672 Cerebral atherosclerosis: Secondary | ICD-10-CM | POA: Insufficient documentation

## 2018-07-20 DIAGNOSIS — Z7982 Long term (current) use of aspirin: Secondary | ICD-10-CM | POA: Insufficient documentation

## 2018-07-20 LAB — CBC WITH DIFFERENTIAL/PLATELET
BASOS ABS: 0.1 10*3/uL (ref 0–0.1)
BASOS PCT: 1 %
EOS ABS: 0.3 10*3/uL (ref 0–0.7)
Eosinophils Relative: 3 %
HEMATOCRIT: 30.7 % — AB (ref 35.0–47.0)
HEMOGLOBIN: 10.5 g/dL — AB (ref 12.0–16.0)
Lymphocytes Relative: 19 %
Lymphs Abs: 1.8 10*3/uL (ref 1.0–3.6)
MCH: 30.1 pg (ref 26.0–34.0)
MCHC: 34.2 g/dL (ref 32.0–36.0)
MCV: 88.1 fL (ref 80.0–100.0)
MONOS PCT: 8 %
Monocytes Absolute: 0.7 10*3/uL (ref 0.2–0.9)
NEUTROS ABS: 6.5 10*3/uL (ref 1.4–6.5)
NEUTROS PCT: 69 %
Platelets: 281 10*3/uL (ref 150–440)
RBC: 3.48 MIL/uL — AB (ref 3.80–5.20)
RDW: 16.3 % — ABNORMAL HIGH (ref 11.5–14.5)
WBC: 9.3 10*3/uL (ref 3.6–11.0)

## 2018-07-20 LAB — COMPREHENSIVE METABOLIC PANEL
ALT: 21 U/L (ref 0–44)
ANION GAP: 8 (ref 5–15)
AST: 28 U/L (ref 15–41)
Albumin: 3.5 g/dL (ref 3.5–5.0)
Alkaline Phosphatase: 137 U/L — ABNORMAL HIGH (ref 38–126)
BUN: 16 mg/dL (ref 8–23)
CO2: 24 mmol/L (ref 22–32)
Calcium: 8.5 mg/dL — ABNORMAL LOW (ref 8.9–10.3)
Chloride: 108 mmol/L (ref 98–111)
Creatinine, Ser: 0.96 mg/dL (ref 0.44–1.00)
GFR, EST AFRICAN AMERICAN: 59 mL/min — AB (ref >60)
GFR, EST NON AFRICAN AMERICAN: 51 mL/min — AB (ref >60)
Glucose, Bld: 104 mg/dL — ABNORMAL HIGH (ref 70–99)
Potassium: 3.6 mmol/L (ref 3.5–5.1)
SODIUM: 140 mmol/L (ref 135–145)
TOTAL PROTEIN: 6 g/dL — AB (ref 6.5–8.1)
Total Bilirubin: 0.3 mg/dL (ref 0.3–1.2)

## 2018-07-20 LAB — LACTIC ACID, PLASMA: Lactic Acid, Venous: 1.5 mmol/L (ref 0.5–1.9)

## 2018-07-20 MED ORDER — SODIUM CHLORIDE 0.9 % IV SOLN
2.0000 g | Freq: Once | INTRAVENOUS | Status: AC
  Administered 2018-07-21: 2 g via INTRAVENOUS
  Filled 2018-07-20: qty 2

## 2018-07-20 MED ORDER — METRONIDAZOLE IN NACL 5-0.79 MG/ML-% IV SOLN
500.0000 mg | Freq: Three times a day (TID) | INTRAVENOUS | Status: DC
  Administered 2018-07-21: 500 mg via INTRAVENOUS
  Filled 2018-07-20: qty 100

## 2018-07-20 MED ORDER — VANCOMYCIN HCL IN DEXTROSE 1-5 GM/200ML-% IV SOLN
1000.0000 mg | Freq: Once | INTRAVENOUS | Status: AC
  Administered 2018-07-21: 1000 mg via INTRAVENOUS
  Filled 2018-07-20: qty 200

## 2018-07-20 NOTE — ED Provider Notes (Signed)
[facility] Emergency Department Provider Note  L5 caveat: history of physical exam limited secondary toAltered mental status  I have reviewed the triage vital signs and the nursing notes.   HISTORY  Chief Complaint Cough and Altered Mental Status    HPI Cathy Johnson is a 84 y.o. female with low-dose of chronic medical condition including dementia presents to the emergency department via EMS with initial call for cardiac arrest and "foaming at the mouth". On EMS arrival patient responds to noxious stimuli with spontaneous cardiopulmonary function.  On arrival to the emergency department patient alert however confused.   Past Medical History:  Diagnosis Date  . Arthritis   . Dementia ([place])   . Hypertension   . Osteoarthritis of right knee 05/02/2014  . PFO (patent foramen ovale)    PFO by bubble study 02/05/11 TEE  . Stroke [facility])     Patient Active Problem List   Diagnosis Date Noted  . Altered mental status 07/21/2018  . Facial droop 03/06/2017  . AKI (acute kidney injury) ([place]) 01/06/2016  . Syncope 01/06/2016  . Dementia ([place]) 01/06/2016  . HTN (hypertension) 01/06/2016  . Anemia 01/06/2016  . Osteoarthritis of right knee 05/02/2014  . Knee osteoarthritis 05/02/2014    Past Surgical History:  Procedure Laterality Date  . BREAST SURGERY     LT BREAST MASS REMOVED  . CARPAL TUNNEL RELEASE    . KNEE ARTHROSCOPY     RIGHT  . PARTIAL KNEE ARTHROPLASTY Right 05/02/2014   Procedure: UNICOMPARTMENTAL KNEE;  Surgeon: Johnny Bridge, MD;  Location: [place];  Service: Orthopedics;  Laterality: Right;    Prior to Admission medications   Medication Sig Start Date End Date Taking? Authorizing Provider  acetaminophen (TYLENOL) 650 MG CR tablet Take 1,300 mg by mouth every 8 (eight) hours as needed for pain.   Yes [provider]  aspirin 81 MG tablet Take 81 mg by mouth daily.    Yes [provider]  atorvastatin (LIPITOR) 20 MG  tablet Take 20 mg by mouth daily at 6 PM.    Yes [provider]  bisacodyl (FLEET) 10 MG/30ML ENEM Place 10 mg rectally as needed.   Yes [provider]  Cholecalciferol (VITAMIN D) 2000 units tablet Take 2,000 Units by mouth daily.   Yes [provider]  clopidogrel (PLAVIX) 75 MG tablet Take 1 tablet (75 mg total) by mouth daily. 03/09/17  Yes Gladstone Lighter, MD  diclofenac sodium (VOLTAREN) 1 % GEL Apply 4 g topically 2 (two) times daily.   Yes [provider]  guaifenesin (ROBITUSSIN) 100 MG/5ML syrup Take 200 mg by mouth 4 (four) times daily as needed for cough.   Yes [provider]  levothyroxine (SYNTHROID, LEVOTHROID) 25 MCG tablet Take 25 mcg by mouth daily.   Yes [provider]  lisinopril (PRINIVIL,ZESTRIL) 5 MG tablet Take 5 mg by mouth daily.   Yes [provider]  loperamide (IMODIUM A-D) 2 MG tablet Take 2 mg by mouth as needed for diarrhea or loose stools.   Yes [provider]  LORazepam (ATIVAN) 0.5 MG tablet Take 1 tablet (0.5 mg total) by mouth 2 (two) times daily. Patient taking differently: Take 0.5 mg by mouth 2 (two) times daily as needed for anxiety.  03/08/17  Yes Gladstone Lighter, MD  memantine (NAMENDA) 10 MG tablet Take 10 mg by mouth 2 (two) times daily.   Yes [provider]  polyethylene glycol (MIRALAX / GLYCOLAX) packet  Take 17 g by mouth daily.   Yes [provider]  risperiDONE (RISPERDAL) 1 MG tablet Take 1 mg by mouth at bedtime.    Yes [provider]  sertraline (ZOLOFT) 50 MG tablet Take 50 mg by mouth daily.   Yes [provider]  telmisartan (MICARDIS) 40 MG tablet Take 40 mg by mouth daily.    Yes [provider]  vitamin B-12 (CYANOCOBALAMIN) 500 MCG tablet Take 500 mcg by mouth daily.    Yes [provider]  divalproex (DEPAKOTE) 250 MG DR tablet Take 250 mg by mouth 2 (two) times daily.     [provider]     Allergies Boniva [ibandronic acid]; Crestor [rosuvastatin calcium]; Exelon [rivastigmine tartrate]; Fosamax [alendronate sodium]; Lipitor [atorvastatin]; Morphine and related; and Vagifem [estradiol]  History reviewed. No pertinent family history.  Social History Social History   Tobacco Use  . Smoking status: Never Smoker  . Smokeless tobacco: Never Used  Substance Use Topics  . Alcohol use: No  . Drug use: No    Review of Systems Constitutional: No fever/chills Eyes: No visual changes. ENT: No sore throat. Cardiovascular: Denies chest pain. Respiratory: Denies shortness of breath. Gastrointestinal: No abdominal pain.  No nausea, no vomiting.  No diarrhea.  No constipation. Genitourinary: Negative for dysuria. Musculoskeletal: Negative for neck pain.  Negative for back pain. Integumentary: Negative for rash. Neurological: Negative for headaches, focal weakness or numbness.   ____________________________________________   PHYSICAL EXAM:  VITAL SIGNS: ED Triage Vitals  Enc Vitals Group     BP      Pulse      Resp      Temp      Temp src      SpO2      Weight      Height      Head Circumference      Peak Flow      Pain Score      Pain Loc      Pain Edu?      Excl. in [place]?     Constitutional: Alert but confused well appearing and in no acute distress. Eyes: Conjunctivae are normal. PERRL. EOMI. Head: Atraumatic. Mouth/Throat: Mucous membranes are moist. Oropharynx non-erythematous. Neck: No stridor.  No meningeal signs.  Cardiovascular: Normal rate, regular rhythm. Good peripheral circulation. Grossly normal heart sounds. Respiratory: Normal respiratory effort.  No retractions. Lungs CTAB. Gastrointestinal: Soft and nontender. No distention.  Musculoskeletal: No lower extremity tenderness nor edema. No gross deformities of extremities. Neurologic:   No gross focal neurologic deficits are appreciated.  Skin:  Skin is warm, dry and intact. No rash  noted. Psychiatric: Mood and affect are normal. Speech and behavior are normal.  ____________________________________________   LABS (all labs ordered are listed, but only abnormal results are displayed)  Labs Reviewed  COMPREHENSIVE METABOLIC PANEL - Abnormal; Notable for the following components:      Result Value   Glucose, Bld 104 (*)    Calcium 8.5 (*)    Total Protein 6.0 (*)    Alkaline Phosphatase 137 (*)    GFR calc non Af Amer 51 (*)    GFR calc Af Amer 59 (*)    All other components within normal limits  CBC WITH DIFFERENTIAL/PLATELET - Abnormal; Notable for the following components:   RBC 3.48 (*)    Hemoglobin 10.5 (*)    HCT 30.7 (*)    RDW 16.3 (*)    All other components within normal limits  URINALYSIS, COMPLETE (UACMP) WITH MICROSCOPIC - Abnormal; Notable for the following components:   Color, Urine YELLOW (*)    APPearance HAZY (*)    Hgb urine dipstick MODERATE (*)    Leukocytes, UA SMALL (*)    All other components within normal limits  CULTURE, BLOOD (ROUTINE X 2)  CULTURE, BLOOD (ROUTINE X 2)  LACTIC ACID, PLASMA  TROPONIN I  VALPROIC ACID LEVEL  VALPROIC ACID LEVEL, FREE  MAGNESIUM  PHOSPHORUS  CALCIUM, IONIZED   ____________________________________________  EKG  ED ECG REPORT I, [place] N Jenasis Straley, the attending physician, personally viewed and interpreted this ECG.   Date: 07/21/2018  EKG Time: 7:35 PM  Rate: 89  Rhythm: Normal Sinus Rhythm  Axis: Normal  Intervals:Normal  ST&T Change: None  ____________________________________________  RADIOLOGY I, Christoval N Fahed Morten, personally viewed and evaluated these images (plain radiographs) as part of my medical decision making, as well as reviewing the written report by the radiologist.  ED MD interpretation:  No intracranial abnormality  Official radiology report(s): Ct Head Wo Contrast  Result Date: 07/21/2018 CLINICAL DATA:  Altered level of consciousness EXAM: CT HEAD WITHOUT  CONTRAST TECHNIQUE: Contiguous axial images were obtained from the base of the skull through the vertex without intravenous contrast. COMPARISON:  05/16/2018 FINDINGS: Brain: No evidence of acute infarction, hemorrhage, hydrocephalus, extra-axial collection or mass lesion/mass effect. Global cortical and central atrophy. Subcortical white matter and periventricular small vessel ischemic changes. Vascular: Mild intracranial atherosclerosis. Skull: Normal. Negative for fracture or focal lesion. Sinuses/Orbits: The visualized paranasal sinuses are essentially clear. The mastoid air cells are unopacified. Other: None. IMPRESSION: No evidence of acute intracranial abnormality. Atrophy with small vessel ischemic changes. Electronically Signed   By: Julian Hy M.D.   On: 07/21/2018 00:13   Dg Chest Port 1 View  Result Date: 07/21/2018 CLINICAL DATA:  Cough EXAM: PORTABLE CHEST 1 VIEW COMPARISON:  09/14/2016 FINDINGS: Lungs are clear.  No pleural effusion or pneumothorax. The heart is normal in size. IMPRESSION: No evidence of acute cardiopulmonary disease. Electronically Signed   By: Julian Hy M.D.   On: 07/21/2018 00:12       Procedures   ____________________________________________   INITIAL IMPRESSION / ASSESSMENT AND PLAN / ED COURSE  As part of my medical decision making, I reviewed the following data within the [place] NUMBER   84 year old female presenting with above-stated history and physical exam.  Patient is now alert and confused however laboratory data revealed no gross abnormality thus far.  EKG revealed no evidence of ischemia or infarction.  Given reported history of cardiac arrest patient discussed with Dr. Aliene Altes for hospital admission for observation. ____________________________________________  FINAL CLINICAL IMPRESSION(S) / ED DIAGNOSES  Final diagnoses:  Altered mental status, unspecified altered mental status type     MEDICATIONS GIVEN  DURING THIS VISIT:  Medications  senna-docusate (Senokot-S) tablet 1 tablet (has no administration in time range)  bisacodyl (DULCOLAX) EC tablet 5 mg (has no administration in time range)  ondansetron (ZOFRAN) tablet 4 mg (has no administration in time range)    Or  ondansetron (ZOFRAN) injection 4 mg (has no administration in time range)  enoxaparin (LOVENOX) injection 40 mg (has no administration in time range)  acetaminophen (TYLENOL) tablet 650 mg (has no administration in time range)    Or  acetaminophen (TYLENOL) suppository 650 mg (has no administration in time range)  aspirin EC tablet 81 mg (has no administration in time range)  cefTRIAXone (ROCEPHIN) 1 g in sodium chloride  0.9 % 100 mL IVPB (has no administration in time range)  lactated ringers infusion ( Intravenous New Bag/Given 07/21/18 0610)  ceFEPIme (MAXIPIME) 2 g in sodium chloride 0.9 % 100 mL IVPB (0 g Intravenous Stopped 07/21/18 0043)  vancomycin (VANCOCIN) IVPB 1000 mg/200 mL premix (0 mg Intravenous Stopped 07/21/18 0118)     ED Discharge Orders    None       Note:  This document was prepared using Dragon voice recognition software and may include unintentional dictation errors.    Gregor Hams, MD 07/21/18 [phone]

## 2018-07-20 NOTE — ED Triage Notes (Signed)
Patient coming from [facility] where EMS was called out for cardiac arrest but patient was in NSR and was awaken but not very verbal but patient does have hx of dementia. Patient has had cough. Vitals were stable for EMS.

## 2018-07-21 ENCOUNTER — Encounter: Payer: Self-pay | Admitting: Internal Medicine

## 2018-07-21 DIAGNOSIS — R4182 Altered mental status, unspecified: Secondary | ICD-10-CM | POA: Diagnosis present

## 2018-07-21 DIAGNOSIS — N390 Urinary tract infection, site not specified: Secondary | ICD-10-CM | POA: Diagnosis not present

## 2018-07-21 DIAGNOSIS — E860 Dehydration: Secondary | ICD-10-CM | POA: Diagnosis not present

## 2018-07-21 DIAGNOSIS — R464 Slowness and poor responsiveness: Secondary | ICD-10-CM | POA: Diagnosis not present

## 2018-07-21 LAB — URINALYSIS, COMPLETE (UACMP) WITH MICROSCOPIC
BILIRUBIN URINE: NEGATIVE
Bacteria, UA: NONE SEEN
GLUCOSE, UA: NEGATIVE mg/dL
KETONES UR: NEGATIVE mg/dL
Nitrite: NEGATIVE
PH: 5 (ref 5.0–8.0)
Protein, ur: NEGATIVE mg/dL
SPECIFIC GRAVITY, URINE: 1.009 (ref 1.005–1.030)

## 2018-07-21 LAB — BLOOD CULTURE ID PANEL (REFLEXED)
Acinetobacter baumannii: NOT DETECTED
CANDIDA GLABRATA: NOT DETECTED
CANDIDA KRUSEI: NOT DETECTED
CANDIDA PARAPSILOSIS: NOT DETECTED
CANDIDA TROPICALIS: NOT DETECTED
Candida albicans: NOT DETECTED
ENTEROBACTER CLOACAE COMPLEX: NOT DETECTED
ESCHERICHIA COLI: NOT DETECTED
Enterobacteriaceae species: NOT DETECTED
Enterococcus species: NOT DETECTED
Haemophilus influenzae: NOT DETECTED
KLEBSIELLA OXYTOCA: NOT DETECTED
KLEBSIELLA PNEUMONIAE: NOT DETECTED
Listeria monocytogenes: NOT DETECTED
Methicillin resistance: DETECTED — AB
Neisseria meningitidis: NOT DETECTED
PROTEUS SPECIES: NOT DETECTED
Pseudomonas aeruginosa: NOT DETECTED
Serratia marcescens: NOT DETECTED
Staphylococcus aureus (BCID): NOT DETECTED
Staphylococcus species: DETECTED — AB
Streptococcus agalactiae: NOT DETECTED
Streptococcus pneumoniae: NOT DETECTED
Streptococcus pyogenes: NOT DETECTED
Streptococcus species: NOT DETECTED

## 2018-07-21 LAB — MRSA PCR SCREENING: MRSA by PCR: NEGATIVE

## 2018-07-21 LAB — PHOSPHORUS: PHOSPHORUS: 3.3 mg/dL (ref 2.5–4.6)

## 2018-07-21 LAB — TROPONIN I: Troponin I: <0.03 ng/mL (ref <0.03)

## 2018-07-21 LAB — MAGNESIUM: Magnesium: 2.1 mg/dL (ref 1.7–2.4)

## 2018-07-21 MED ORDER — VANCOMYCIN HCL IN DEXTROSE 750-5 MG/150ML-% IV SOLN: 750.0000 mg | INTRAVENOUS | Status: DC

## 2018-07-21 MED ORDER — ASPIRIN 81 MG PO TABS: 81.0000 mg | ORAL_TABLET | Freq: Every day | ORAL | Status: DC

## 2018-07-21 MED ORDER — VITAMIN B-12 1000 MCG PO TABS
500.0000 ug | ORAL_TABLET | Freq: Every day | ORAL | Status: DC
  Administered 2018-07-21 – 2018-07-22 (×2): 500 ug via ORAL
  Filled 2018-07-21 (×2): qty 1

## 2018-07-21 MED ORDER — VITAMIN D3 25 MCG (1000 UNIT) PO TABS
2000.0000 [IU] | ORAL_TABLET | Freq: Every day | ORAL | Status: DC
  Administered 2018-07-21 – 2018-07-22 (×2): 2000 [IU] via ORAL
  Filled 2018-07-21 (×2): qty 2

## 2018-07-21 MED ORDER — ASPIRIN EC 81 MG PO TBEC
81.0000 mg | DELAYED_RELEASE_TABLET | Freq: Every day | ORAL | Status: DC
  Administered 2018-07-21 – 2018-07-22 (×2): 81 mg via ORAL
  Filled 2018-07-21 (×2): qty 1

## 2018-07-21 MED ORDER — ONDANSETRON HCL 4 MG PO TABS: 4.0000 mg | ORAL_TABLET | Freq: Four times a day (QID) | ORAL | Status: DC | PRN

## 2018-07-21 MED ORDER — SODIUM CHLORIDE 0.9 % IV SOLN: 2.0000 g | Freq: Two times a day (BID) | INTRAVENOUS | Status: DC

## 2018-07-21 MED ORDER — LACTATED RINGERS IV SOLN
INTRAVENOUS | Status: AC
  Administered 2018-07-21: 06:00:00 via INTRAVENOUS

## 2018-07-21 MED ORDER — RISPERIDONE 1 MG PO TABS
1.0000 mg | ORAL_TABLET | Freq: Every day | ORAL | Status: DC
  Administered 2018-07-21: 1 mg via ORAL
  Filled 2018-07-21 (×2): qty 1

## 2018-07-21 MED ORDER — CLOPIDOGREL BISULFATE 75 MG PO TABS
75.0000 mg | ORAL_TABLET | Freq: Every day | ORAL | Status: DC
  Administered 2018-07-21 – 2018-07-22 (×2): 75 mg via ORAL
  Filled 2018-07-21 (×2): qty 1

## 2018-07-21 MED ORDER — LEVOTHYROXINE SODIUM 25 MCG PO TABS
25.0000 ug | ORAL_TABLET | ORAL | Status: DC
  Administered 2018-07-21 – 2018-07-22 (×2): 25 ug via ORAL
  Filled 2018-07-21 (×2): qty 1

## 2018-07-21 MED ORDER — VANCOMYCIN HCL IN DEXTROSE 1-5 GM/200ML-% IV SOLN
1000.0000 mg | INTRAVENOUS | Status: DC
  Administered 2018-07-21: 1000 mg via INTRAVENOUS
  Filled 2018-07-21 (×2): qty 200

## 2018-07-21 MED ORDER — ONDANSETRON HCL 4 MG/2ML IJ SOLN: 4.0000 mg | Freq: Four times a day (QID) | INTRAMUSCULAR | Status: DC | PRN

## 2018-07-21 MED ORDER — LORAZEPAM 0.5 MG PO TABS
0.5000 mg | ORAL_TABLET | Freq: Two times a day (BID) | ORAL | Status: DC | PRN
  Administered 2018-07-21 – 2018-07-22 (×2): 0.5 mg via ORAL
  Filled 2018-07-21 (×2): qty 1

## 2018-07-21 MED ORDER — POLYETHYLENE GLYCOL 3350 17 G PO PACK
17.0000 g | PACK | Freq: Every day | ORAL | Status: DC
  Administered 2018-07-21 – 2018-07-22 (×2): 17 g via ORAL
  Filled 2018-07-21 (×2): qty 1

## 2018-07-21 MED ORDER — SODIUM CHLORIDE 0.9 % IV SOLN
1.0000 g | INTRAVENOUS | Status: DC
  Administered 2018-07-21 – 2018-07-22 (×2): 1 g via INTRAVENOUS
  Filled 2018-07-21 (×2): qty 1

## 2018-07-21 MED ORDER — SERTRALINE HCL 50 MG PO TABS
50.0000 mg | ORAL_TABLET | Freq: Every day | ORAL | Status: DC
  Administered 2018-07-21 – 2018-07-22 (×2): 50 mg via ORAL
  Filled 2018-07-21 (×2): qty 1

## 2018-07-21 MED ORDER — DIVALPROEX SODIUM 250 MG PO DR TAB
250.0000 mg | DELAYED_RELEASE_TABLET | Freq: Two times a day (BID) | ORAL | Status: DC
  Filled 2018-07-21 (×2): qty 1

## 2018-07-21 MED ORDER — ACETAMINOPHEN 650 MG RE SUPP: 650.0000 mg | Freq: Four times a day (QID) | RECTAL | Status: DC | PRN

## 2018-07-21 MED ORDER — MEMANTINE HCL 10 MG PO TABS
10.0000 mg | ORAL_TABLET | Freq: Two times a day (BID) | ORAL | Status: DC
  Administered 2018-07-21 – 2018-07-22 (×3): 10 mg via ORAL
  Filled 2018-07-21 (×4): qty 1

## 2018-07-21 MED ORDER — ENOXAPARIN SODIUM 40 MG/0.4ML ~~LOC~~ SOLN
40.0000 mg | SUBCUTANEOUS | Status: DC
  Administered 2018-07-21: 40 mg via SUBCUTANEOUS
  Filled 2018-07-21: qty 0.4

## 2018-07-21 MED ORDER — BISACODYL 5 MG PO TBEC: 5.0000 mg | DELAYED_RELEASE_TABLET | Freq: Every day | ORAL | Status: DC | PRN

## 2018-07-21 MED ORDER — ATORVASTATIN CALCIUM 20 MG PO TABS
20.0000 mg | ORAL_TABLET | Freq: Every day | ORAL | Status: DC
  Administered 2018-07-21: 20 mg via ORAL
  Filled 2018-07-21: qty 1

## 2018-07-21 MED ORDER — IRBESARTAN 150 MG PO TABS
150.0000 mg | ORAL_TABLET | Freq: Every day | ORAL | Status: DC
  Administered 2018-07-21 – 2018-07-22 (×2): 150 mg via ORAL
  Filled 2018-07-21 (×2): qty 1

## 2018-07-21 MED ORDER — ACETAMINOPHEN 325 MG PO TABS: 650.0000 mg | ORAL_TABLET | Freq: Four times a day (QID) | ORAL | Status: DC | PRN

## 2018-07-21 MED ORDER — SENNOSIDES-DOCUSATE SODIUM 8.6-50 MG PO TABS: 1.0000 | ORAL_TABLET | Freq: Every evening | ORAL | Status: DC | PRN

## 2018-07-21 NOTE — Progress Notes (Signed)
PHARMACY - PHYSICIAN COMMUNICATION CRITICAL VALUE ALERT - BLOOD CULTURE IDENTIFICATION (BCID)  Cathy Johnson is an 84 y.o. female who presented to [facility] on 07/20/2018 with a chief complaint of AMS  Assessment:  MRSS (1/2 sets)  Name of physician (or Provider) Contacted: Dr. Dustin Flock  Current antibiotics: ceftriaxone  Changes to prescribed antibiotics recommended: Will add vancomycin  Results for orders placed or performed during the hospital encounter of 07/20/18  Blood Culture ID Panel (Reflexed) (Collected: 07/20/2018 11:46 PM)  Result Value Ref Range   Enterococcus species NOT DETECTED NOT DETECTED   Listeria monocytogenes NOT DETECTED NOT DETECTED   Staphylococcus species DETECTED (A) NOT DETECTED   Staphylococcus aureus NOT DETECTED NOT DETECTED   Methicillin resistance DETECTED (A) NOT DETECTED   Streptococcus species NOT DETECTED NOT DETECTED   Streptococcus agalactiae NOT DETECTED NOT DETECTED   Streptococcus pneumoniae NOT DETECTED NOT DETECTED   Streptococcus pyogenes NOT DETECTED NOT DETECTED   Acinetobacter baumannii NOT DETECTED NOT DETECTED   Enterobacteriaceae species NOT DETECTED NOT DETECTED   Enterobacter cloacae complex NOT DETECTED NOT DETECTED   Escherichia coli NOT DETECTED NOT DETECTED   Klebsiella oxytoca NOT DETECTED NOT DETECTED   Klebsiella pneumoniae NOT DETECTED NOT DETECTED   Proteus species NOT DETECTED NOT DETECTED   Serratia marcescens NOT DETECTED NOT DETECTED   Haemophilus influenzae NOT DETECTED NOT DETECTED   Neisseria meningitidis NOT DETECTED NOT DETECTED   Pseudomonas aeruginosa NOT DETECTED NOT DETECTED   Candida albicans NOT DETECTED NOT DETECTED   Candida glabrata NOT DETECTED NOT DETECTED   Candida krusei NOT DETECTED NOT DETECTED   Candida parapsilosis NOT DETECTED NOT DETECTED   Candida tropicalis NOT DETECTED NOT DETECTED    Tawnya Crook, PharmD Pharmacy Resident  07/21/2018 7:41 PM

## 2018-07-21 NOTE — Progress Notes (Signed)
Pharmacy Antibiotic Note  Cathy Johnson is a 84 y.o. female admitted on 07/20/2018 with AMS. BCID 10/2 with MRSS (1/2 sets). Suspect likely contaminant. Per discussion with MD, will treat with vancomycin. Pharmacy has been consulted for vancomycin dosing.  Plan: Patient received vancomycin 1 g IV 10/2 at [place]. Will start vancomycin 1 g IV q24h with first dose now. Goal trough 15-20 mcg/mL. Will order trough prior to 4th dose  Height: 5\' 4"  (162.6 cm) Weight: 157 lb 4.8 oz (71.4 kg) IBW/kg (Calculated) : 54.7  Temp (24hrs), Avg:98.4 F (36.9 C), Min:97.5 F (36.4 C), Max:99.1 F (37.3 C)  Recent Labs  Lab 07/20/18 2333  WBC 9.3  CREATININE 0.96  LATICACIDVEN 1.5    Estimated Creatinine Clearance: 39.3 mL/min (by C-G formula based on SCr of 0.96 mg/dL).    Allergies  Allergen Reactions  . Boniva [Ibandronic Acid]   . Crestor [Rosuvastatin Calcium]   . Exelon [Rivastigmine Tartrate]   . Fosamax [Alendronate Sodium]   . Lipitor [Atorvastatin]   . Morphine And Related   . Vagifem [Estradiol]     Antimicrobials this admission: Cefepime 10/2 x 1 dose Vancomycin 10/2 >> Ceftriaxone 10/2 >>  Dose adjustments this admission: N/A  Microbiology results: 10/1 BCx: MRSS (1 set)  10/1 MRSA PCR: negative  Thank you for allowing pharmacy to be a part of this patient's care.  Tawnya Crook, PharmD Pharmacy Resident  07/21/2018 7:52 PM

## 2018-07-21 NOTE — ED Notes (Addendum)
This nurse was on hold for 10 min trying to give report but floor nurse tied up in another room and unable to take report at this time.

## 2018-07-21 NOTE — H&P (Signed)
[place] [facility] [place] NAME: Cathy Johnson    MR#:  [id number]  DATE OF BIRTH:  1928-04-01  DATE OF ADMISSION:  07/20/2018  PRIMARY CARE PHYSICIAN: [facility]   REQUESTING/REFERRING PHYSICIAN: Gregor Hams, MD  CHIEF COMPLAINT:   Chief Complaint  Patient presents with  . Cough  . Altered Mental Status    HISTORY OF PRESENT ILLNESS:  Cathy Johnson  is a 84 y.o. female with a known history of severe dementia, PFO, on ASA, p/w reported "cardiac arrest". Per my understanding, pt resides at [facility], and was reportedly found foaming at the mouth and poorly responsive by staff. EMS was called, and on arrival pt was found to have a pulse. On my assessment, she has pulse, respirations and adequate blood pressure and oxygenation to sustain life. Her labwork does not demonstrate the metabolic indices one would typically associated with cardiac arrest and global hypoperfusion. Based on the aforementioned information, paired with the fact that no resuscitative measures were instituted, I am led to conclude that the pt did not indeed suffer from a cardiac arrest. The pt is AAOx0, and can provide no useful/meaningful information. She is very pleasant though. My best guess as to the event precipitating hospitalization would be an episode of transient AMS, possible LOC/syncope. CT head (-) acute intracranial abnl.  PAST MEDICAL HISTORY:   Past Medical History:  Diagnosis Date  . Arthritis   . Dementia ([place])   . Hypertension   . Osteoarthritis of right knee 05/02/2014  . PFO (patent foramen ovale)    PFO by bubble study 02/05/11 TEE  . Stroke [facility])     PAST SURGICAL HISTORY:   Past Surgical History:  Procedure Laterality Date  . BREAST SURGERY     LT BREAST MASS REMOVED  . CARPAL TUNNEL RELEASE    . KNEE ARTHROSCOPY     RIGHT  . PARTIAL KNEE ARTHROPLASTY Right 05/02/2014   Procedure: UNICOMPARTMENTAL KNEE;  Surgeon: Johnny Bridge, MD;  Location: [place];  Service: Orthopedics;  Laterality: Right;    SOCIAL HISTORY:   Social History   Tobacco Use  . Smoking status: Never Smoker  . Smokeless tobacco: Never Used  Substance Use Topics  . Alcohol use: No    FAMILY HISTORY:  History reviewed. No pertinent family history.  DRUG ALLERGIES:   Allergies  Allergen Reactions  . Boniva [Ibandronic Acid]   . Crestor [Rosuvastatin Calcium]   . Exelon [Rivastigmine Tartrate]   . Fosamax [Alendronate Sodium]   . Lipitor [Atorvastatin]   . Morphine And Related   . Vagifem [Estradiol]     REVIEW OF SYSTEMS:   Review of Systems  Unable to perform ROS: Dementia   Severe dementia, AAOx0. Denies pain. MEDICATIONS AT HOME:   Prior to Admission medications   Medication Sig Start Date End Date Taking? Authorizing Provider  acetaminophen (TYLENOL) 650 MG CR tablet Take 1,300 mg by mouth every 8 (eight) hours as needed for pain.   Yes [provider]  aspirin 81 MG tablet Take 81 mg by mouth daily.    Yes [provider]  atorvastatin (LIPITOR) 20 MG tablet Take 20 mg by mouth daily at 6 PM.    Yes [provider]  bisacodyl (FLEET) 10 MG/30ML ENEM Place 10 mg rectally as needed.   Yes [provider]  Cholecalciferol (VITAMIN D) 2000 units tablet Take 2,000 Units by mouth daily.   Yes [provider]  clopidogrel (PLAVIX) 75 MG tablet Take 1 tablet (75 mg total) by mouth daily. 03/09/17  Yes Gladstone Lighter, MD  diclofenac sodium (VOLTAREN) 1 % GEL Apply 4 g topically 2 (two) times daily.   Yes [provider]  guaifenesin (ROBITUSSIN) 100 MG/5ML syrup Take 200 mg by mouth 4 (four) times daily as needed for cough.   Yes [provider]  levothyroxine (SYNTHROID, LEVOTHROID) 25 MCG tablet Take 25 mcg by mouth daily.   Yes [provider]  lisinopril (PRINIVIL,ZESTRIL) 5 MG tablet Take 5 mg by mouth daily.   Yes [provider]    loperamide (IMODIUM A-D) 2 MG tablet Take 2 mg by mouth as needed for diarrhea or loose stools.   Yes [provider]  LORazepam (ATIVAN) 0.5 MG tablet Take 1 tablet (0.5 mg total) by mouth 2 (two) times daily. Patient taking differently: Take 0.5 mg by mouth 2 (two) times daily as needed for anxiety.  03/08/17  Yes Gladstone Lighter, MD  memantine (NAMENDA) 10 MG tablet Take 10 mg by mouth 2 (two) times daily.   Yes [provider]  polyethylene glycol (MIRALAX / GLYCOLAX) packet Take 17 g by mouth daily.   Yes [provider]  risperiDONE (RISPERDAL) 1 MG tablet Take 1 mg by mouth at bedtime.    Yes [provider]  sertraline (ZOLOFT) 50 MG tablet Take 50 mg by mouth daily.   Yes [provider]  telmisartan (MICARDIS) 40 MG tablet Take 40 mg by mouth daily.    Yes [provider]  vitamin B-12 (CYANOCOBALAMIN) 500 MCG tablet Take 500 mcg by mouth daily.    Yes [provider]  divalproex (DEPAKOTE) 250 MG DR tablet Take 250 mg by mouth 2 (two) times daily.     [provider]      VITAL SIGNS:  Blood pressure (!) 175/90, pulse 79, temperature 99.1 F (37.3 C), temperature source Rectal, resp. rate (!) 22, weight 65 kg, SpO2 97 %.  PHYSICAL EXAMINATION:  Physical Exam  Constitutional: She appears well-developed and well-nourished. She is active and cooperative.  Non-toxic appearance. She does not have a sickly appearance. She does not appear ill. No distress. She is not intubated.  HENT:  Head: Atraumatic.  Mouth/Throat: Oropharynx is clear and moist. No oropharyngeal exudate.  Eyes: Conjunctivae, EOM and lids are normal. No scleral icterus.  Neck: Neck supple. No JVD present. No thyromegaly present.  Cardiovascular: Normal rate, regular rhythm, S1 normal, S2 normal and normal heart sounds.  No extrasystoles are present. Exam reveals no gallop, no S3, no S4, no distant heart sounds and no friction rub.  No murmur  heard. Pulmonary/Chest: Effort normal. No accessory muscle usage or stridor. No apnea, no tachypnea and no bradypnea. She is not intubated. No respiratory distress. She has decreased breath sounds in the right upper field, the right middle field, the right lower field, the left upper field and the left middle field. She has no wheezes. She has no rhonchi. She has no rales.  Abdominal: Soft. Bowel sounds are normal. She exhibits no distension. There is no tenderness. There is no rigidity, no rebound and no guarding.  Musculoskeletal: Normal range of motion. She exhibits no edema or tenderness.  Lymphadenopathy:    She has no cervical adenopathy.  Neurological: She is alert.  (+) dementia.  Skin: Skin is warm and dry. No rash noted. She is not diaphoretic. No erythema.  Psychiatric: Her speech is delayed and tangential.  Cognition and memory are impaired. She is noncommunicative. She exhibits abnormal recent memory and abnormal remote memory.  (+) dementia.   LABORATORY PANEL:   CBC Recent Labs  Lab 07/20/18 2333  WBC 9.3  HGB 10.5*  HCT 30.7*  PLT 281   ------------------------------------------------------------------------------------------------------------------  Chemistries  Recent Labs  Lab 07/20/18 2333  NA 140  K 3.6  CL 108  CO2 24  GLUCOSE 104*  BUN 16  CREATININE 0.96  CALCIUM 8.5*  AST 28  ALT 21  ALKPHOS 137*  BILITOT 0.3   ------------------------------------------------------------------------------------------------------------------  Cardiac Enzymes No results for input(s): TROPONINI in the last 168 hours. ------------------------------------------------------------------------------------------------------------------  RADIOLOGY:  Ct Head Wo Contrast  Result Date: 07/21/2018 CLINICAL DATA:  Altered level of consciousness EXAM: CT HEAD WITHOUT CONTRAST TECHNIQUE: Contiguous axial images were obtained from the base of the skull through the vertex  without intravenous contrast. COMPARISON:  05/16/2018 FINDINGS: Brain: No evidence of acute infarction, hemorrhage, hydrocephalus, extra-axial collection or mass lesion/mass effect. Global cortical and central atrophy. Subcortical white matter and periventricular small vessel ischemic changes. Vascular: Mild intracranial atherosclerosis. Skull: Normal. Negative for fracture or focal lesion. Sinuses/Orbits: The visualized paranasal sinuses are essentially clear. The mastoid air cells are unopacified. Other: None. IMPRESSION: No evidence of acute intracranial abnormality. Atrophy with small vessel ischemic changes. Electronically Signed   By: Julian Hy M.D.   On: 07/21/2018 00:13   Dg Chest Port 1 View  Result Date: 07/21/2018 CLINICAL DATA:  Cough EXAM: PORTABLE CHEST 1 VIEW COMPARISON:  09/14/2016 FINDINGS: Lungs are clear.  No pleural effusion or pneumothorax. The heart is normal in size. IMPRESSION: No evidence of acute cardiopulmonary disease. Electronically Signed   By: Julian Hy M.D.   On: 07/21/2018 00:12   IMPRESSION AND PLAN:   A/P: 39F w/ severe dementia presents from [facility] w/ reported "cardiac arrest", more likely transient AMS/poor responsiveness, possible LOC/syncope. -Transient AMS/poor responsiveness, possible LOC/syncope: VSS. Exam unimpressive. Labwork demonstrates mild hyperglycemia, hypocalcemia, hypoproteinemia and normocytic anemia; these findings are consistent with prior labwork, and appear to be chronic. CT head (-) acute intracranial abnl. CXR (-) acute cardiopulmonary pathology. Situation is unclear. The only thing that is clear is that the pt did not have a cardiac arrest. U/A demonstrates what may represent a mild UTI. She is also likely mildly dehydrated. Cardiac monitoring. Ceftriaxone. Gentle IVF. Will check Depakote level. 03/07/2017 Echo demonstrated, "Mild LVH with LVEF 65-70%. Indeterminate diastolic function.  Mildly calcified aortic valve. Trivial  mitral and tricuspid regurgitation". Given her severe dementia, she is not ordered for any further AMS or neurocardiogenic syncope workup, as these are not likely to inform/affect her management. Anticipate D/C back to [facility] during the day (barring any unforeseen circumstance). -c/w home meds. -FEN/GI: Regular diet. -DVT PPx: Lovenox. -Code status: DNR/DNI. -Disposition: Observation, < 2 midnights.   All the records are reviewed and case discussed with ED provider. Management plans discussed with the patient, family and they are in agreement.  CODE STATUS: DNR/DNI.  TOTAL TIME TAKING CARE OF THIS PATIENT: 45 minutes.    Arta Silence M.D on 07/21/2018 at 4:57 AM  Between 7am to 6pm - Pager - [phone]  After 6pm go to [email or web address] - Proofreader  [facility]  Office  [phone]  CC: Primary care physician; [facility]   Note: This dictation was prepared with Dragon dictation along with smaller phrase technology. Any transcriptional errors that result from this process are unintentional.

## 2018-07-21 NOTE — ED Notes (Signed)
Patient transported to 251

## 2018-07-21 NOTE — Progress Notes (Signed)
Patient was transferred from the ER following acute AMS. On admission patient was able to follow command , but was unable to provide answer to admission questions. She was alert to self. Patient was placed on the monitor, and provide redirection and re-orientation as needed.

## 2018-07-21 NOTE — Progress Notes (Signed)
CODE SEPSIS - PHARMACY COMMUNICATION  **Broad Spectrum Antibiotics should be administered within 1 hour of Sepsis diagnosis**  Time Code Sepsis Called/Page Received: 1001 2332  Antibiotics Ordered: 1001 2332  Time of 1st antibiotic administration: 1002 0010  Additional action taken by pharmacy:   If necessary, Name of Provider/Nurse Contacted:     Eloise Harman ,PharmD Clinical Pharmacist  07/21/2018  12:15 AM

## 2018-07-21 NOTE — Care Management Obs Status (Signed)
[place] NOTIFICATION   Patient Details  Name: Cathy Johnson MRN: [id number] Date of Birth: 19-Feb-1928   Medicare Observation Status Notification Given:  Yes    Elza Rafter, RN 07/21/2018, 1:20 PM

## 2018-07-21 NOTE — Progress Notes (Signed)
Admitted this morning for altered mental status thought to have cardiac arrest but patient, was responding with pulse and BP so did not receive CPR.  But EMS was called from the facility as foaming from mouth.  Patient completely demented.  Lab work unremarkable except slightly elevated lactic acid.  Patient slept most of the morning, now alert but confused with her baseline dementia. Assessment and plan; altered mental status likely because of her dementia admitted for evaluation of infection, continue IV fluids, IV antibiotics for today and then likely discharge back to [facility] tomorrow.  Labs, chest x-ray unrevealing.  Patient also has no urine infection.  Continue IV fluids for today. Time spent 20 minutes

## 2018-07-21 NOTE — Progress Notes (Signed)
Pharmacy Antibiotic Note  Cathy Johnson is a 84 y.o. female admitted on 07/20/2018 with unknown source.  Pharmacy has been consulted for vancomycin and cefepime dosing.  Plan: DW 53kg  Vd 37L kei 0.033 hr-1  T1/2 21 hours Vancomycin 750 mg q 24 hours ordered with stacked dosing. Level before 5th dose. Goal trough 15-20.  Cefepime 2 grams q 12 hours ordered  Weight: 143 lb 4.8 oz (65 kg)  Temp (24hrs), Avg:99.1 F (37.3 C), Min:99.1 F (37.3 C), Max:99.1 F (37.3 C)  Recent Labs  Lab 07/20/18 2333  WBC 9.3  CREATININE 0.96  LATICACIDVEN 1.5    Estimated Creatinine Clearance: 34.1 mL/min (by C-G formula based on SCr of 0.96 mg/dL).    Allergies  Allergen Reactions  . Boniva [Ibandronic Acid]   . Crestor [Rosuvastatin Calcium]   . Exelon [Rivastigmine Tartrate]   . Fosamax [Alendronate Sodium]   . Lipitor [Atorvastatin]   . Morphine And Related   . Vagifem [Estradiol]     Antimicrobials this admission: Vancomycin, cefepime, metronidazole 10/2  >>    >>   Dose adjustments this admission:   Microbiology results: 10/1 BCx: pending      10/2 UA: LE(+) NO2(-) WBC 11-20 10/2 CXR: no acute disease  Thank you for allowing pharmacy to be a part of this patient's care.  Lindberg Zenon S 07/21/2018 1:49 AM

## 2018-07-22 DIAGNOSIS — R464 Slowness and poor responsiveness: Secondary | ICD-10-CM | POA: Diagnosis not present

## 2018-07-22 DIAGNOSIS — N390 Urinary tract infection, site not specified: Secondary | ICD-10-CM | POA: Diagnosis not present

## 2018-07-22 DIAGNOSIS — R4182 Altered mental status, unspecified: Secondary | ICD-10-CM | POA: Diagnosis not present

## 2018-07-22 DIAGNOSIS — E860 Dehydration: Secondary | ICD-10-CM | POA: Diagnosis not present

## 2018-07-22 LAB — BASIC METABOLIC PANEL
Anion gap: 6 (ref 5–15)
BUN: 14 mg/dL (ref 8–23)
CO2: 29 mmol/L (ref 22–32)
CREATININE: 0.81 mg/dL (ref 0.44–1.00)
Calcium: 8.7 mg/dL — ABNORMAL LOW (ref 8.9–10.3)
Chloride: 107 mmol/L (ref 98–111)
GFR calc Af Amer: >60 mL/min (ref >60)
GFR calc non Af Amer: >60 mL/min (ref >60)
Glucose, Bld: 98 mg/dL (ref 70–99)
POTASSIUM: 3.9 mmol/L (ref 3.5–5.1)
Sodium: 142 mmol/L (ref 135–145)

## 2018-07-22 LAB — CALCIUM, IONIZED: CALCIUM, IONIZED, SERUM: 4.9 mg/dL (ref 4.5–5.6)

## 2018-07-22 NOTE — Progress Notes (Signed)
IVs and tele removed from patient.Report called to [employer], EMS transporting patient.

## 2018-07-22 NOTE — Clinical Social Work Note (Signed)
CSW spoke with supervisor Laqueta Linden at [facility] memory care floor to see if their administrator or nurse can evaluate patient to make sure she is able to return back to ALF.  Supervisor stated she will notify ED and/or nurse, CSW to continue to follow patient's progress throughout discharge planning.  Jones Broom. [place], MSW, [place]  07/22/2018 10:23 AM

## 2018-07-22 NOTE — Discharge Planning (Signed)
Cathy Johnson, is a 84 y.o. female  DOB [birth year]  MRN [id number].  Admission date:  07/20/2018  Admitting Physician  Arta Silence, MD  Discharge Date:  07/22/2018   Primary MD  Housecalls, Doctors Making  Recommendations for primary care physician for things to follow:  Follow-up with Dr. making house calls as needed   Admission Diagnosis  [facility] - Cough, altered mental status   Discharge Diagnosis  [facility] - Cough, altered mental status    Active Problems:   Altered mental status      Past Medical History:  Diagnosis Date  . Arthritis   . Dementia ([place])   . Hypertension   . Osteoarthritis of right knee 05/02/2014  . PFO (patent foramen ovale)    PFO by bubble study 02/05/11 TEE  . Stroke [facility])     Past Surgical History:  Procedure Laterality Date  . BREAST SURGERY     LT BREAST MASS REMOVED  . CARPAL TUNNEL RELEASE    . KNEE ARTHROSCOPY     RIGHT  . PARTIAL KNEE ARTHROPLASTY Right 05/02/2014   Procedure: UNICOMPARTMENTAL KNEE;  Surgeon: Johnny Bridge, MD;  Location: [place];  Service: Orthopedics;  Laterality: Right;       History of present illness and  Hospital Course:     Kindly see H&P for history of present illness and admission details, please review complete Labs, Consult reports and Test reports for all details in brief  HPI  from the history and physical done on the day of admission 84 year old female with advanced dementia comes from [facility] Alzheimer's unit because of cough, altered mental status.  Admitted for evaluation of possible sepsis.   Hospital Course  Transient altered mental status likely secondary to worsening of her dementia, patient is admitted to medical service, ruled out for sepsis patient did not have sepsis initial blood cultures showed staph aureus likely  contaminant, blood cultures final growth is negative.  Patient remains asymptomatic, she is at her baseline as per her caregiver patient received IV fluids on admission today she is sitting in chair and eating well and she is stable to go back to [facility] when arrangements are made.  *#2 slightly better lactic acid without evidence of sepsis.  Received IV fluids, empiric antibiotics.  No further need of antibiotics at discharge. 3.  Advanced Alzheimer's dementia, Continue Depakote, Namenda, Risperdal, Ativan. 4.  Essential hypertension; controlled, continue previous medicines, lisinopril, telmisartan. Discharge Condition: stable  Follow UP  Follow-up Information    Housecalls, Doctors Making. Schedule an appointment as soon as possible for a visit in 1 week(s).   Specialty:  Geriatric Medicine Contact information: [address] [place] [address] [phone]             Discharge Instructions  and  Discharge Medications      Allergies as of 07/22/2018      Reactions   Boniva [ibandronic Acid]    Crestor [rosuvastatin Calcium]    Exelon [rivastigmine Tartrate]    Fosamax [alendronate Sodium]    Lipitor [atorvastatin]    Morphine And Related    Vagifem [estradiol]       Medication List    TAKE these medications   acetaminophen 650 MG CR tablet Commonly known as:  TYLENOL Take 1,300 mg by mouth every 8 (eight) hours as needed for pain.   aspirin 81 MG tablet Take 81 mg by mouth daily.   atorvastatin 20 MG tablet  Commonly known as:  LIPITOR Take 20 mg by mouth daily at 6 PM.   bisacodyl 10 MG/30ML Enem Commonly known as:  FLEET Place 10 mg rectally as needed.   clopidogrel 75 MG tablet Commonly known as:  PLAVIX Take 1 tablet (75 mg total) by mouth daily.   diclofenac sodium 1 % Gel Commonly known as:  VOLTAREN Apply 4 g topically 2 (two) times daily.   divalproex 250 MG DR tablet Commonly known as:  DEPAKOTE Take 250 mg by mouth 2  (two) times daily.   guaifenesin 100 MG/5ML syrup Commonly known as:  ROBITUSSIN Take 200 mg by mouth 4 (four) times daily as needed for cough.   levothyroxine 25 MCG tablet Commonly known as:  SYNTHROID, LEVOTHROID Take 25 mcg by mouth daily.   lisinopril 5 MG tablet Commonly known as:  PRINIVIL,ZESTRIL Take 5 mg by mouth daily.   loperamide 2 MG tablet Commonly known as:  IMODIUM A-D Take 2 mg by mouth as needed for diarrhea or loose stools.   LORazepam 0.5 MG tablet Commonly known as:  ATIVAN Take 1 tablet (0.5 mg total) by mouth 2 (two) times daily. What changed:    when to take this  reasons to take this   memantine 10 MG tablet Commonly known as:  NAMENDA Take 10 mg by mouth 2 (two) times daily.   polyethylene glycol packet Commonly known as:  MIRALAX / GLYCOLAX Take 17 g by mouth daily.   risperiDONE 1 MG tablet Commonly known as:  RISPERDAL Take 1 mg by mouth at bedtime.   sertraline 50 MG tablet Commonly known as:  ZOLOFT Take 50 mg by mouth daily.   telmisartan 40 MG tablet Commonly known as:  MICARDIS Take 40 mg by mouth daily.   vitamin B-12 500 MCG tablet Commonly known as:  CYANOCOBALAMIN Take 500 mcg by mouth daily.   Vitamin D 2000 units tablet Take 2,000 Units by mouth daily.         Diet and Activity recommendation: See Discharge Instructions above   Consults obtained -none  Major procedures and Radiology Reports - PLEASE review detailed and final reports for all details, in brief -      Ct Head Wo Contrast  Result Date: 07/21/2018 CLINICAL DATA:  Altered level of consciousness EXAM: CT HEAD WITHOUT CONTRAST TECHNIQUE: Contiguous axial images were obtained from the base of the skull through the vertex without intravenous contrast. COMPARISON:  05/16/2018 FINDINGS: Brain: No evidence of acute infarction, hemorrhage, hydrocephalus, extra-axial collection or mass lesion/mass effect. Global cortical and central atrophy. Subcortical  white matter and periventricular small vessel ischemic changes. Vascular: Mild intracranial atherosclerosis. Skull: Normal. Negative for fracture or focal lesion. Sinuses/Orbits: The visualized paranasal sinuses are essentially clear. The mastoid air cells are unopacified. Other: None. IMPRESSION: No evidence of acute intracranial abnormality. Atrophy with small vessel ischemic changes. Electronically Signed   By: Julian Hy M.D.   On: 07/21/2018 00:13   Dg Chest Port 1 View  Result Date: 07/21/2018 CLINICAL DATA:  Cough EXAM: PORTABLE CHEST 1 VIEW COMPARISON:  09/14/2016 FINDINGS: Lungs are clear.  No pleural effusion or pneumothorax. The heart is normal in size. IMPRESSION: No evidence of acute cardiopulmonary disease. Electronically Signed   By: Julian Hy M.D.   On: 07/21/2018 00:12    Micro Results     Recent Results (from the past 240 hour(s))  Blood Culture (routine x 2)     Status: None (Preliminary result)   Collection Time: 07/20/18  11:46 PM  Result Value Ref Range Status   Specimen Description BLOOD RIGHT HAND  Final   Special Requests   Final    BOTTLES DRAWN AEROBIC AND ANAEROBIC Blood Culture adequate volume   Culture   Final    NO GROWTH 2 DAYS Performed at [facility], [address] [place], [place] [address]    Report Status PENDING  Incomplete  Blood Culture (routine x 2)     Status: Abnormal (Preliminary result)   Collection Time: 07/20/18 11:46 PM  Result Value Ref Range Status   Specimen Description   Final    BLOOD LEFT ANTECUBITAL Performed at [facility], [address], [place], [place] [address]    Special Requests   Final    BOTTLES DRAWN AEROBIC AND ANAEROBIC Blood Culture adequate volume Performed at [facility], [address] [place], [place] [address]    Culture  Setup Time   Final    GRAM POSITIVE COCCI IN BOTH AEROBIC AND ANAEROBIC BOTTLES CRITICAL RESULT CALLED TO, READ BACK BY AND VERIFIED WITH:  ABBIE ELLINGTON AT [place] 07/21/2018.  TFK    Culture (A)  Final    STAPHYLOCOCCUS SPECIES (COAGULASE NEGATIVE) THE SIGNIFICANCE OF ISOLATING THIS ORGANISM FROM A SINGLE SET OF BLOOD CULTURES WHEN MULTIPLE SETS ARE DRAWN IS UNCERTAIN. PLEASE NOTIFY THE MICROBIOLOGY DEPARTMENT WITHIN ONE WEEK IF SPECIATION AND SENSITIVITIES ARE REQUIRED. Performed at [facility], [place] [address], [place], [place] [address]    Report Status PENDING  Incomplete  Blood Culture ID Panel (Reflexed)     Status: Abnormal   Collection Time: 07/20/18 11:46 PM  Result Value Ref Range Status   Enterococcus species NOT DETECTED NOT DETECTED Final   Listeria monocytogenes NOT DETECTED NOT DETECTED Final   Staphylococcus species DETECTED (A) NOT DETECTED Final    Comment: Methicillin (oxacillin) resistant coagulase negative staphylococcus. Possible blood culture contaminant (unless isolated from more than one blood culture draw or clinical case suggests pathogenicity). No antibiotic treatment is indicated for blood  culture contaminants. CRITICAL RESULT CALLED TO, READ BACK BY AND VERIFIED WITH: ABBIE ELLINGTON AT [place] 07/21/2018.  TFK    Staphylococcus aureus (BCID) NOT DETECTED NOT DETECTED Final   Methicillin resistance DETECTED (A) NOT DETECTED Final    Comment: CRITICAL RESULT CALLED TO, READ BACK BY AND VERIFIED WITH: ABBIE ELLINGTON AT 1911 07/21/2018.  TFK    Streptococcus species NOT DETECTED NOT DETECTED Final   Streptococcus agalactiae NOT DETECTED NOT DETECTED Final   Streptococcus pneumoniae NOT DETECTED NOT DETECTED Final   Streptococcus pyogenes NOT DETECTED NOT DETECTED Final   Acinetobacter baumannii NOT DETECTED NOT DETECTED Final   Enterobacteriaceae species NOT DETECTED NOT DETECTED Final   Enterobacter cloacae complex NOT DETECTED NOT DETECTED Final   Escherichia coli NOT DETECTED NOT DETECTED Final   Klebsiella oxytoca NOT DETECTED NOT DETECTED Final   Klebsiella pneumoniae NOT DETECTED  NOT DETECTED Final   Proteus species NOT DETECTED NOT DETECTED Final   Serratia marcescens NOT DETECTED NOT DETECTED Final   Haemophilus influenzae NOT DETECTED NOT DETECTED Final   Neisseria meningitidis NOT DETECTED NOT DETECTED Final   Pseudomonas aeruginosa NOT DETECTED NOT DETECTED Final   Candida albicans NOT DETECTED NOT DETECTED Final   Candida glabrata NOT DETECTED NOT DETECTED Final   Candida krusei NOT DETECTED NOT DETECTED Final   Candida parapsilosis NOT DETECTED NOT DETECTED Final   Candida tropicalis NOT DETECTED NOT DETECTED Final    Comment: Performed at [employer]  [facility], [address] [place], [place] [address]  MRSA PCR Screening     Status: None   Collection Time: 07/21/18  9:56 AM  Result Value Ref Range Status   MRSA by PCR NEGATIVE NEGATIVE Final    Comment:        The GeneXpert MRSA Assay (FDA approved for NASAL specimens only), is one component of a comprehensive MRSA colonization surveillance program. It is not intended to diagnose MRSA infection nor to guide or monitor treatment for MRSA infections. Performed at [facility] [address], [place], [place] [address]        Today   Subjective:   Cathy Johnson  Is stable  For discharge. Objective:   Blood pressure (!) 173/92, pulse 84, temperature 98.3 F (36.8 C), resp. rate 19, height 5\' 4"  (1.626 m), weight 71.4 kg, SpO2 94 %.   Intake/Output Summary (Last 24 hours) at 07/22/2018 1311 Last data filed at 07/22/2018 0908 Gross per 24 hour  Intake 1225.23 ml  Output 1550 ml  Net -324.77 ml    Exam Awake butpleasantly confused with baseline dementia deficits, Normal affect [place].AT,PERRAL Supple Neck,No JVD, No cervical lymphadenopathy appriciated.  Symmetrical Chest wall movement, Good air movement bilaterally, CTAB RRR,No Gallops,Rubs or new Murmurs, No Parasternal Heave +ve B.Sounds, Abd Soft, Non tender, No organomegaly appriciated, No rebound -guarding or  rigidity. No Cyanosis, Clubbing or edema, No new Rash or bruise  Data Review   CBC w Diff:  Lab Results  Component Value Date   WBC 9.3 07/20/2018   HGB 10.5 (L) 07/20/2018   HCT 30.7 (L) 07/20/2018   PLT 281 07/20/2018   LYMPHOPCT 19 07/20/2018   MONOPCT 8 07/20/2018   EOSPCT 3 07/20/2018   BASOPCT 1 07/20/2018    CMP:  Lab Results  Component Value Date   NA 142 07/22/2018   K 3.9 07/22/2018   CL 107 07/22/2018   CO2 29 07/22/2018   BUN 14 07/22/2018   CREATININE 0.81 07/22/2018   PROT 6.0 (L) 07/20/2018   ALBUMIN 3.5 07/20/2018   BILITOT 0.3 07/20/2018   ALKPHOS 137 (H) 07/20/2018   AST 28 07/20/2018   ALT 21 07/20/2018  .   Total Time in preparing paper work, data evaluation and todays exam - 84 minutes  Epifanio Lesches M.D on 07/22/2018 at 1:11 PM    Note: This dictation was prepared with Dragon dictation along with smaller phrase technology. Any transcriptional errors that result from this process are unintentional.

## 2018-07-22 NOTE — NC FL2 (Addendum)
[place] LEVEL OF CARE SCREENING TOOL     IDENTIFICATION  Patient Name: Cathy Johnson Birthdate: 05/11/1928 Sex: female Admission Date (Current Location): 07/20/2018  [place] and [place] Number:  Engineering geologist and Address:  [facility], [address] [place], [place] [address]      Provider Number: [id number]  Attending Physician Name and Address:  Epifanio Lesches, MD  Relative Name and Phone Number:     [place], Hewes [phone]  [phone]   [place], Emminger [phone]   Sheppard,Janet Relative [phone]       Current Level of Care: Hospital Recommended Level of Care: Memory Care([facility]) Prior Approval Number:    Date Approved/Denied:   PASRR Number:    Discharge Plan: Domiciliary (Rest home)([facility])    Current Diagnoses: Patient Active Problem List   Diagnosis Date Noted  . Altered mental status 07/21/2018  . Facial droop 03/06/2017  . AKI (acute kidney injury) ([place]) 01/06/2016  . Syncope 01/06/2016  . Dementia ([place]) 01/06/2016  . HTN (hypertension) 01/06/2016  . Anemia 01/06/2016  . Osteoarthritis of right knee 05/02/2014  . Knee osteoarthritis 05/02/2014    Orientation RESPIRATION BLADDER Height & Weight     Self  Normal Incontinent Weight: 157 lb 4.8 oz (71.4 kg) Height:  5\' 4"  (162.6 cm)  BEHAVIORAL SYMPTOMS/MOOD NEUROLOGICAL BOWEL NUTRITION STATUS      Incontinent Diet(Regular)  AMBULATORY STATUS COMMUNICATION OF NEEDS Skin   Supervision Verbally Normal                       Personal Care Assistance Level of Assistance  Bathing, Feeding, Dressing Bathing Assistance: Limited assistance Feeding assistance: Independent Dressing Assistance: Limited assistance     Functional Limitations Info  Sight, Hearing, Speech Sight Info: Adequate Hearing Info: Adequate Speech Info: Adequate    SPECIAL CARE FACTORS FREQUENCY                       Contractures Contractures Info: Not present    Additional Factors Info  Code Status, Allergies, Psychotropic Code Status Info: DNR Allergies Info: BONIVA IBANDRONIC ACID, CRESTOR ROSUVASTATIN CALCIUM, EXELON RIVASTIGMINE TARTRATE, FOSAMAX ALENDRONATE SODIUM, LIPITOR ATORVASTATIN, MORPHINE AND RELATED, VAGIFEM ESTRADIOL  Psychotropic Info: risperiDONE (RISPERDAL) tablet 1 mg , and sertraline (ZOLOFT) tablet 50 mg          Current Medications (07/22/2018):  This is the current hospital active medication list Current Facility-Administered Medications  Medication Dose Route Frequency Provider Last Rate Last Dose  . acetaminophen (TYLENOL) tablet 650 mg  650 mg Oral Q6H PRN Arta Silence, MD       Or  . acetaminophen (TYLENOL) suppository 650 mg  650 mg Rectal Q6H PRN Arta Silence, MD      . aspirin EC tablet 81 mg  81 mg Oral Daily Arta Silence, MD   81 mg at 07/22/18 0928  . atorvastatin (LIPITOR) tablet 20 mg  20 mg Oral q1800 Arta Silence, MD   20 mg at 07/21/18 1740  . bisacodyl (DULCOLAX) EC tablet 5 mg  5 mg Oral Daily PRN Arta Silence, MD      . cholecalciferol (VITAMIN D) tablet 2,000 Units  2,000 Units Oral Daily Arta Silence, MD   2,000 Units at 07/22/18 0948  . clopidogrel (PLAVIX) tablet 75 mg  75 mg Oral Daily Arta Silence, MD   75 mg at 07/22/18 0928  . enoxaparin (LOVENOX) injection 40 mg  40 mg Subcutaneous Q24H Arta Silence, MD   40 mg at 07/21/18 2109  . irbesartan (AVAPRO) tablet 150 mg  150 mg Oral Daily Arta Silence, MD   150 mg at 07/22/18 0928  . levothyroxine (SYNTHROID, LEVOTHROID) tablet 25 mcg  25 mcg Oral Larinda Buttery, Aliene Altes, MD   25 mcg at 07/22/18 0509  . LORazepam (ATIVAN) tablet 0.5 mg  0.5 mg Oral BID PRN Arta Silence, MD   0.5 mg at 07/21/18 1620  . memantine (NAMENDA) tablet 10 mg  10 mg Oral BID Arta Silence, MD   10 mg at 07/22/18 0948  . ondansetron (ZOFRAN) tablet  4 mg  4 mg Oral Q6H PRN Arta Silence, MD       Or  . ondansetron (ZOFRAN) injection 4 mg  4 mg Intravenous Q6H PRN Jodell Cipro, Prasanna, MD      . polyethylene glycol (MIRALAX / GLYCOLAX) packet 17 g  17 g Oral Daily Arta Silence, MD   17 g at 07/22/18 0928  . risperiDONE (RISPERDAL) tablet 1 mg  1 mg Oral QHS Arta Silence, MD   1 mg at 07/21/18 2109  . senna-docusate (Senokot-S) tablet 1 tablet  1 tablet Oral QHS PRN Arta Silence, MD      . sertraline (ZOLOFT) tablet 50 mg  50 mg Oral Daily Arta Silence, MD   50 mg at 07/22/18 0928  . vitamin B-12 (CYANOCOBALAMIN) tablet 500 mcg  500 mcg Oral Daily Arta Silence, MD   500 mcg at 07/22/18 3299     Discharge Medications: TAKE these medications   acetaminophen 650 MG CR tablet Commonly known as:  TYLENOL Take 1,300 mg by mouth every 8 (eight) hours as needed for pain.   aspirin 81 MG tablet Take 81 mg by mouth daily.   atorvastatin 20 MG tablet Commonly known as:  LIPITOR Take 20 mg by mouth daily at 6 PM.   bisacodyl 10 MG/30ML Enem Commonly known as:  FLEET Place 10 mg rectally as needed.   clopidogrel 75 MG tablet Commonly known as:  PLAVIX Take 1 tablet (75 mg total) by mouth daily.   diclofenac sodium 1 % Gel Commonly known as:  VOLTAREN Apply 4 g topically 2 (two) times daily.   divalproex 250 MG DR tablet Commonly known as:  DEPAKOTE Take 250 mg by mouth 2 (two) times daily.   guaifenesin 100 MG/5ML syrup Commonly known as:  ROBITUSSIN Take 200 mg by mouth 4 (four) times daily as needed for cough.   levothyroxine 25 MCG tablet Commonly known as:  SYNTHROID, LEVOTHROID Take 25 mcg by mouth daily.   lisinopril 5 MG tablet Commonly known as:  PRINIVIL,ZESTRIL Take 5 mg by mouth daily.   loperamide 2 MG tablet Commonly known as:  IMODIUM A-D Take 2 mg by mouth as needed for diarrhea or loose stools.   LORazepam 0.5 MG tablet Commonly known as:  ATIVAN Take 1  tablet (0.5 mg total) by mouth 2 (two) times daily. What changed:    when to take this  reasons to take this   memantine 10 MG tablet Commonly known as:  NAMENDA Take 10 mg by mouth 2 (two) times daily.   polyethylene glycol packet Commonly known as:  MIRALAX / GLYCOLAX Take 17 g by mouth daily.   risperiDONE 1 MG tablet Commonly known as:  RISPERDAL Take 1 mg by mouth at bedtime.   sertraline 50 MG tablet Commonly known as:  ZOLOFT Take 50 mg by mouth daily.  telmisartan 40 MG tablet Commonly known as:  MICARDIS Take 40 mg by mouth daily.   vitamin B-12 500 MCG tablet Commonly known as:  CYANOCOBALAMIN Take 500 mcg by mouth daily.   Vitamin D 2000 units tablet Take 2,000 Units by mouth daily.     Relevant Imaging Results:  Relevant Lab Results:   Additional Information SS# [id number]  Anell Barr

## 2018-07-23 LAB — CULTURE, BLOOD (ROUTINE X 2): Special Requests: ADEQUATE

## 2018-07-25 LAB — CULTURE, BLOOD (ROUTINE X 2)
Culture: NO GROWTH
SPECIAL REQUESTS: ADEQUATE

## 2018-07-25 NOTE — Discharge Summary (Signed)
Cathy Johnson, is a 84 y.o. female  DOB April 03, 1928  MRN [id number].  Admission date:  07/20/2018  Admitting Physician  Arta Silence, MD  Discharge Date:  07/22/2018   Primary MD  Housecalls, Doctors Making  Recommendations for primary care physician for things to follow:  Follow-up with Dr. making house calls as needed   Admission Diagnosis  [facility] - Cough, altered mental status   Discharge Diagnosis  [facility] - Cough, altered mental status    Active Problems:   Altered mental status      Past Medical History:  Diagnosis Date  . Arthritis   . Dementia ([place])   . Hypertension   . Osteoarthritis of right knee 05/02/2014  . PFO (patent foramen ovale)    PFO by bubble study 02/05/11 TEE  . Stroke [facility])     Past Surgical History:  Procedure Laterality Date  . BREAST SURGERY     LT BREAST MASS REMOVED  . CARPAL TUNNEL RELEASE    . KNEE ARTHROSCOPY     RIGHT  . PARTIAL KNEE ARTHROPLASTY Right 05/02/2014   Procedure: UNICOMPARTMENTAL KNEE;  Surgeon: Johnny Bridge, MD;  Location: [facility];  Service: Orthopedics;  Laterality: Right;       History of present illness and  Hospital Course:     Kindly see H&P for history of present illness and admission details, please review complete Labs, Consult reports and Test reports for all details in brief  HPI  from the history and physical done on the day of admission 84 year old female with advanced dementia comes from [facility] Alzheimer's unit because of cough, altered mental status.  Admitted for evaluation of possible sepsis.   Hospital Course  Transient altered mental status likely secondary to worsening of her dementia, patient is admitted to medical service, ruled out for sepsis patient did not have sepsis initial blood cultures showed staph aureus likely  contaminant, blood cultures final growth is negative.  Patient remains asymptomatic, she is at her baseline as per her caregiver patient received IV fluids on admission today she is sitting in chair and eating well and she is stable to go back to [facility] when arrangements are made.  *#2 slightly better lactic acid without evidence of sepsis.  Received IV fluids, empiric antibiotics.  No further need of antibiotics at discharge. 3.  Advanced Alzheimer's dementia, Continue Depakote, Namenda, Risperdal, Ativan. 4.  Essential hypertension; controlled, continue previous medicines, lisinopril, telmisartan. Discharge Condition: stable  Follow UP  Follow-up Information    Housecalls, Doctors Making. Schedule an appointment as soon as possible for a visit in 1 week(s).   Specialty:  Geriatric Medicine Contact information: [place] [address] [phone]             Discharge Instructions  and  Discharge Medications      Allergies as of 07/22/2018      Reactions   Boniva [ibandronic Acid]    Crestor [rosuvastatin Calcium]    Exelon [rivastigmine Tartrate]    Fosamax [alendronate Sodium]    Lipitor [atorvastatin]    Morphine And Related    Vagifem [estradiol]       Medication List    TAKE these medications   acetaminophen 650 MG CR tablet Commonly known as:  TYLENOL Take 1,300 mg by mouth every 8 (eight) hours as needed for pain.   aspirin 81 MG tablet Take 81 mg by mouth daily.   atorvastatin 20 MG tablet  Commonly known as:  LIPITOR Take 20 mg by mouth daily at 6 PM.   bisacodyl 10 MG/30ML Enem Commonly known as:  FLEET Place 10 mg rectally as needed.   clopidogrel 75 MG tablet Commonly known as:  PLAVIX Take 1 tablet (75 mg total) by mouth daily.   diclofenac sodium 1 % Gel Commonly known as:  VOLTAREN Apply 4 g topically 2 (two) times daily.   divalproex 250 MG DR tablet Commonly known as:  DEPAKOTE Take 250 mg by mouth 2  (two) times daily.   guaifenesin 100 MG/5ML syrup Commonly known as:  ROBITUSSIN Take 200 mg by mouth 4 (four) times daily as needed for cough.   levothyroxine 25 MCG tablet Commonly known as:  SYNTHROID, LEVOTHROID Take 25 mcg by mouth daily.   lisinopril 5 MG tablet Commonly known as:  PRINIVIL,ZESTRIL Take 5 mg by mouth daily.   loperamide 2 MG tablet Commonly known as:  IMODIUM A-D Take 2 mg by mouth as needed for diarrhea or loose stools.   LORazepam 0.5 MG tablet Commonly known as:  ATIVAN Take 1 tablet (0.5 mg total) by mouth 2 (two) times daily. What changed:    when to take this  reasons to take this   memantine 10 MG tablet Commonly known as:  NAMENDA Take 10 mg by mouth 2 (two) times daily.   polyethylene glycol packet Commonly known as:  MIRALAX / GLYCOLAX Take 17 g by mouth daily.   risperiDONE 1 MG tablet Commonly known as:  RISPERDAL Take 1 mg by mouth at bedtime.   sertraline 50 MG tablet Commonly known as:  ZOLOFT Take 50 mg by mouth daily.   telmisartan 40 MG tablet Commonly known as:  MICARDIS Take 40 mg by mouth daily.   vitamin B-12 500 MCG tablet Commonly known as:  CYANOCOBALAMIN Take 500 mcg by mouth daily.   Vitamin D 2000 units tablet Take 2,000 Units by mouth daily.         Diet and Activity recommendation: See Discharge Instructions above   Consults obtained -none  Major procedures and Radiology Reports - PLEASE review detailed and final reports for all details, in brief -      Ct Head Wo Contrast  Result Date: 07/21/2018 CLINICAL DATA:  Altered level of consciousness EXAM: CT HEAD WITHOUT CONTRAST TECHNIQUE: Contiguous axial images were obtained from the base of the skull through the vertex without intravenous contrast. COMPARISON:  05/16/2018 FINDINGS: Brain: No evidence of acute infarction, hemorrhage, hydrocephalus, extra-axial collection or mass lesion/mass effect. Global cortical and central atrophy. Subcortical  white matter and periventricular small vessel ischemic changes. Vascular: Mild intracranial atherosclerosis. Skull: Normal. Negative for fracture or focal lesion. Sinuses/Orbits: The visualized paranasal sinuses are essentially clear. The mastoid air cells are unopacified. Other: None. IMPRESSION: No evidence of acute intracranial abnormality. Atrophy with small vessel ischemic changes. Electronically Signed   By: Julian Hy M.D.   On: 07/21/2018 00:13   Dg Chest Port 1 View  Result Date: 07/21/2018 CLINICAL DATA:  Cough EXAM: PORTABLE CHEST 1 VIEW COMPARISON:  09/14/2016 FINDINGS: Lungs are clear.  No pleural effusion or pneumothorax. The heart is normal in size. IMPRESSION: No evidence of acute cardiopulmonary disease. Electronically Signed   By: Julian Hy M.D.   On: 07/21/2018 00:12    Micro Results     Recent Results (from the past 240 hour(s))  Blood Culture (routine x 2)     Status: None   Collection Time: 07/20/18 11:46 PM  Result Value Ref Range Status   Specimen Description BLOOD RIGHT HAND  Final   Special Requests   Final    BOTTLES DRAWN AEROBIC AND ANAEROBIC Blood Culture adequate volume   Culture   Final    NO GROWTH 5 DAYS Performed at [facility], [address], [place], [place] [address]    Report Status 07/25/2018 FINAL  Final  Blood Culture (routine x 2)     Status: Abnormal   Collection Time: 07/20/18 11:46 PM  Result Value Ref Range Status   Specimen Description   Final    BLOOD LEFT ANTECUBITAL Performed at [facility], [address] [place], [place] [address]    Special Requests   Final    BOTTLES DRAWN AEROBIC AND ANAEROBIC Blood Culture adequate volume Performed at [facility], [address], [place], [place] [address]    Culture  Setup Time   Final    GRAM POSITIVE COCCI IN BOTH AEROBIC AND ANAEROBIC BOTTLES CRITICAL RESULT CALLED TO, READ BACK BY AND VERIFIED WITH: ABBIE ELLINGTON AT [place] 07/21/2018.   TFK    Culture (A)  Final    STAPHYLOCOCCUS SPECIES (COAGULASE NEGATIVE) THE SIGNIFICANCE OF ISOLATING THIS ORGANISM FROM A SINGLE SET OF BLOOD CULTURES WHEN MULTIPLE SETS ARE DRAWN IS UNCERTAIN. PLEASE NOTIFY THE MICROBIOLOGY DEPARTMENT WITHIN ONE WEEK IF SPECIATION AND SENSITIVITIES ARE REQUIRED. Performed at [facility], [place] [address], [place], [place] [address]    Report Status 07/23/2018 FINAL  Final  Blood Culture ID Panel (Reflexed)     Status: Abnormal   Collection Time: 07/20/18 11:46 PM  Result Value Ref Range Status   Enterococcus species NOT DETECTED NOT DETECTED Final   Listeria monocytogenes NOT DETECTED NOT DETECTED Final   Staphylococcus species DETECTED (A) NOT DETECTED Final    Comment: Methicillin (oxacillin) resistant coagulase negative staphylococcus. Possible blood culture contaminant (unless isolated from more than one blood culture draw or clinical case suggests pathogenicity). No antibiotic treatment is indicated for blood  culture contaminants. CRITICAL RESULT CALLED TO, READ BACK BY AND VERIFIED WITH: ABBIE ELLINGTON AT [place] 07/21/2018.  TFK    Staphylococcus aureus (BCID) NOT DETECTED NOT DETECTED Final   Methicillin resistance DETECTED (A) NOT DETECTED Final    Comment: CRITICAL RESULT CALLED TO, READ BACK BY AND VERIFIED WITH: ABBIE ELLINGTON AT 1911 07/21/2018.  TFK    Streptococcus species NOT DETECTED NOT DETECTED Final   Streptococcus agalactiae NOT DETECTED NOT DETECTED Final   Streptococcus pneumoniae NOT DETECTED NOT DETECTED Final   Streptococcus pyogenes NOT DETECTED NOT DETECTED Final   Acinetobacter baumannii NOT DETECTED NOT DETECTED Final   Enterobacteriaceae species NOT DETECTED NOT DETECTED Final   Enterobacter cloacae complex NOT DETECTED NOT DETECTED Final   Escherichia coli NOT DETECTED NOT DETECTED Final   Klebsiella oxytoca NOT DETECTED NOT DETECTED Final   Klebsiella pneumoniae NOT DETECTED NOT DETECTED Final   Proteus  species NOT DETECTED NOT DETECTED Final   Serratia marcescens NOT DETECTED NOT DETECTED Final   Haemophilus influenzae NOT DETECTED NOT DETECTED Final   Neisseria meningitidis NOT DETECTED NOT DETECTED Final   Pseudomonas aeruginosa NOT DETECTED NOT DETECTED Final   Candida albicans NOT DETECTED NOT DETECTED Final   Candida glabrata NOT DETECTED NOT DETECTED Final   Candida krusei NOT DETECTED NOT DETECTED Final   Candida parapsilosis NOT DETECTED NOT DETECTED Final   Candida tropicalis NOT DETECTED NOT DETECTED Final    Comment: Performed at [facility], [address] [place], [place] [address]  MRSA PCR Screening     Status: None   Collection Time: 07/21/18  9:56 AM  Result Value Ref Range Status   MRSA by PCR NEGATIVE NEGATIVE Final    Comment:        The GeneXpert MRSA Assay (FDA approved for NASAL specimens only), is one component of a comprehensive MRSA colonization surveillance program. It is not intended to diagnose MRSA infection nor to guide or monitor treatment for MRSA infections. Performed at [facility], [address] [place], [place] [address]        Today   Subjective:   Cathy Johnson  Is stable  For discharge. Objective:   Blood pressure (!) 151/67, pulse 83, temperature 98.9 F (37.2 C), temperature source Oral, resp. rate 19, height 5\' 4"  (1.626 m), weight 71.4 kg, SpO2 97 %.  No intake or output data in the 24 hours ending 07/25/18 1845  Exam Awake butpleasantly confused with baseline dementia deficits, Normal affect Nina.AT,PERRAL Supple Neck,No JVD, No cervical lymphadenopathy appriciated.  Symmetrical Chest wall movement, Good air movement bilaterally, CTAB RRR,No Gallops,Rubs or new Murmurs, No Parasternal Heave +ve B.Sounds, Abd Soft, Non tender, No organomegaly appriciated, No rebound -guarding or rigidity. No Cyanosis, Clubbing or edema, No new Rash or bruise  Data Review   CBC w Diff:  Lab Results  Component  Value Date   WBC 9.3 07/20/2018   HGB 10.5 (L) 07/20/2018   HCT 30.7 (L) 07/20/2018   PLT 281 07/20/2018   LYMPHOPCT 19 07/20/2018   MONOPCT 8 07/20/2018   EOSPCT 3 07/20/2018   BASOPCT 1 07/20/2018    CMP:  Lab Results  Component Value Date   NA 142 07/22/2018   K 3.9 07/22/2018   CL 107 07/22/2018   CO2 29 07/22/2018   BUN 14 07/22/2018   CREATININE 0.81 07/22/2018   PROT 6.0 (L) 07/20/2018   ALBUMIN 3.5 07/20/2018   BILITOT 0.3 07/20/2018   ALKPHOS 137 (H) 07/20/2018   AST 28 07/20/2018   ALT 21 07/20/2018  .   Total Time in preparing paper work, data evaluation and todays exam - 10 minutes  Epifanio Lesches M.D on 07/22/2018 at 6:45 PM    Note: This dictation was prepared with Dragon dictation along with smaller phrase technology. Any transcriptional errors that result from this process are unintentional.

## 2018-07-29 DIAGNOSIS — E785 Hyperlipidemia, unspecified: Secondary | ICD-10-CM | POA: Diagnosis not present

## 2018-07-29 DIAGNOSIS — I10 Essential (primary) hypertension: Secondary | ICD-10-CM | POA: Diagnosis not present

## 2018-07-29 DIAGNOSIS — D649 Anemia, unspecified: Secondary | ICD-10-CM | POA: Diagnosis not present

## 2018-07-29 DIAGNOSIS — F0390 Unspecified dementia without behavioral disturbance: Secondary | ICD-10-CM | POA: Diagnosis not present

## 2018-08-05 ENCOUNTER — Inpatient Hospital Stay (HOSPITAL_COMMUNITY)
Admission: EM | Admit: 2018-08-05 | Discharge: 2018-08-06 | DRG: 064 | Disposition: A | Discharge status: Skilled Nursing Facility | Payer: Medicare Other | Attending: Internal Medicine | Admitting: Internal Medicine

## 2018-08-05 ENCOUNTER — Emergency Department (HOSPITAL_COMMUNITY): Payer: Medicare Other

## 2018-08-05 ENCOUNTER — Observation Stay (HOSPITAL_COMMUNITY): Payer: Medicare Other

## 2018-08-05 ENCOUNTER — Other Ambulatory Visit: Payer: Self-pay

## 2018-08-05 ENCOUNTER — Encounter (HOSPITAL_COMMUNITY): Payer: Self-pay | Admitting: Neurology

## 2018-08-05 DIAGNOSIS — R29711 NIHSS score 11: Secondary | ICD-10-CM | POA: Diagnosis present

## 2018-08-05 DIAGNOSIS — R4182 Altered mental status, unspecified: Secondary | ICD-10-CM

## 2018-08-05 DIAGNOSIS — E785 Hyperlipidemia, unspecified: Secondary | ICD-10-CM | POA: Diagnosis present

## 2018-08-05 DIAGNOSIS — R5383 Other fatigue: Secondary | ICD-10-CM | POA: Diagnosis not present

## 2018-08-05 DIAGNOSIS — I5030 Unspecified diastolic (congestive) heart failure: Secondary | ICD-10-CM | POA: Diagnosis not present

## 2018-08-05 DIAGNOSIS — Z79890 Hormone replacement therapy (postmenopausal): Secondary | ICD-10-CM | POA: Diagnosis not present

## 2018-08-05 DIAGNOSIS — Z96651 Presence of right artificial knee joint: Secondary | ICD-10-CM | POA: Diagnosis present

## 2018-08-05 DIAGNOSIS — G92 Toxic encephalopathy: Secondary | ICD-10-CM | POA: Diagnosis present

## 2018-08-05 DIAGNOSIS — I679 Cerebrovascular disease, unspecified: Secondary | ICD-10-CM | POA: Diagnosis not present

## 2018-08-05 DIAGNOSIS — Z885 Allergy status to narcotic agent status: Secondary | ICD-10-CM | POA: Diagnosis not present

## 2018-08-05 DIAGNOSIS — Z7982 Long term (current) use of aspirin: Secondary | ICD-10-CM

## 2018-08-05 DIAGNOSIS — Z8249 Family history of ischemic heart disease and other diseases of the circulatory system: Secondary | ICD-10-CM | POA: Diagnosis not present

## 2018-08-05 DIAGNOSIS — M2550 Pain in unspecified joint: Secondary | ICD-10-CM | POA: Diagnosis not present

## 2018-08-05 DIAGNOSIS — I10 Essential (primary) hypertension: Secondary | ICD-10-CM | POA: Diagnosis present

## 2018-08-05 DIAGNOSIS — R748 Abnormal levels of other serum enzymes: Secondary | ICD-10-CM | POA: Diagnosis present

## 2018-08-05 DIAGNOSIS — R29898 Other symptoms and signs involving the musculoskeletal system: Secondary | ICD-10-CM | POA: Diagnosis not present

## 2018-08-05 DIAGNOSIS — I517 Cardiomegaly: Secondary | ICD-10-CM | POA: Diagnosis not present

## 2018-08-05 DIAGNOSIS — Z79899 Other long term (current) drug therapy: Secondary | ICD-10-CM | POA: Diagnosis not present

## 2018-08-05 DIAGNOSIS — F0390 Unspecified dementia without behavioral disturbance: Secondary | ICD-10-CM | POA: Diagnosis present

## 2018-08-05 DIAGNOSIS — D649 Anemia, unspecified: Secondary | ICD-10-CM | POA: Diagnosis present

## 2018-08-05 DIAGNOSIS — Z8673 Personal history of transient ischemic attack (TIA), and cerebral infarction without residual deficits: Secondary | ICD-10-CM

## 2018-08-05 DIAGNOSIS — R4781 Slurred speech: Secondary | ICD-10-CM | POA: Diagnosis not present

## 2018-08-05 DIAGNOSIS — I639 Cerebral infarction, unspecified: Secondary | ICD-10-CM | POA: Diagnosis present

## 2018-08-05 DIAGNOSIS — I6381 Other cerebral infarction due to occlusion or stenosis of small artery: Principal | ICD-10-CM | POA: Diagnosis present

## 2018-08-05 DIAGNOSIS — G459 Transient cerebral ischemic attack, unspecified: Secondary | ICD-10-CM | POA: Diagnosis not present

## 2018-08-05 DIAGNOSIS — Z7401 Bed confinement status: Secondary | ICD-10-CM | POA: Diagnosis not present

## 2018-08-05 DIAGNOSIS — Z7902 Long term (current) use of antithrombotics/antiplatelets: Secondary | ICD-10-CM

## 2018-08-05 DIAGNOSIS — Z66 Do not resuscitate: Secondary | ICD-10-CM | POA: Diagnosis present

## 2018-08-05 DIAGNOSIS — R410 Disorientation, unspecified: Secondary | ICD-10-CM | POA: Diagnosis not present

## 2018-08-05 DIAGNOSIS — R29810 Facial weakness: Secondary | ICD-10-CM | POA: Diagnosis present

## 2018-08-05 DIAGNOSIS — R471 Dysarthria and anarthria: Secondary | ICD-10-CM | POA: Diagnosis present

## 2018-08-05 DIAGNOSIS — E039 Hypothyroidism, unspecified: Secondary | ICD-10-CM | POA: Diagnosis present

## 2018-08-05 DIAGNOSIS — Z888 Allergy status to other drugs, medicaments and biological substances status: Secondary | ICD-10-CM

## 2018-08-05 DIAGNOSIS — R29818 Other symptoms and signs involving the nervous system: Secondary | ICD-10-CM | POA: Diagnosis not present

## 2018-08-05 LAB — COMPREHENSIVE METABOLIC PANEL
ALBUMIN: 3.7 g/dL (ref 3.5–5.0)
ALT: 19 U/L (ref 0–44)
AST: 19 U/L (ref 15–41)
Alkaline Phosphatase: 131 U/L — ABNORMAL HIGH (ref 38–126)
Anion gap: 11 (ref 5–15)
BUN: 20 mg/dL (ref 8–23)
CHLORIDE: 106 mmol/L (ref 98–111)
CO2: 23 mmol/L (ref 22–32)
CREATININE: 1.07 mg/dL — AB (ref 0.44–1.00)
Calcium: 9.2 mg/dL (ref 8.9–10.3)
GFR calc Af Amer: 52 mL/min — ABNORMAL LOW (ref >60)
GFR, EST NON AFRICAN AMERICAN: 45 mL/min — AB (ref >60)
GLUCOSE: 102 mg/dL — AB (ref 70–99)
POTASSIUM: 4 mmol/L (ref 3.5–5.1)
Sodium: 140 mmol/L (ref 135–145)
Total Bilirubin: 0.4 mg/dL (ref 0.3–1.2)
Total Protein: 6.2 g/dL — ABNORMAL LOW (ref 6.5–8.1)

## 2018-08-05 LAB — I-STAT TROPONIN, ED: TROPONIN I, POC: 0 ng/mL (ref 0.00–0.08)

## 2018-08-05 LAB — AMMONIA: Ammonia: 13 umol/L (ref 9–35)

## 2018-08-05 LAB — CBC
HCT: 35.2 % — ABNORMAL LOW (ref 36.0–46.0)
HEMOGLOBIN: 10.7 g/dL — AB (ref 12.0–15.0)
MCH: 28.8 pg (ref 26.0–34.0)
MCHC: 30.4 g/dL (ref 30.0–36.0)
MCV: 94.9 fL (ref 80.0–100.0)
NRBC: 0 % (ref 0.0–0.2)
PLATELETS: 235 10*3/uL (ref 150–400)
RBC: 3.71 MIL/uL — AB (ref 3.87–5.11)
RDW: 16 % — ABNORMAL HIGH (ref 11.5–15.5)
WBC: 7 10*3/uL (ref 4.0–10.5)

## 2018-08-05 LAB — DIFFERENTIAL
ABS IMMATURE GRANULOCYTES: 0.02 10*3/uL (ref 0.00–0.07)
BASOS PCT: 1 %
Basophils Absolute: 0 10*3/uL (ref 0.0–0.1)
EOS ABS: 0.3 10*3/uL (ref 0.0–0.5)
Eosinophils Relative: 4 %
IMMATURE GRANULOCYTES: 0 %
LYMPHS ABS: 1.7 10*3/uL (ref 0.7–4.0)
Lymphocytes Relative: 23 %
MONOS PCT: 11 %
Monocytes Absolute: 0.8 10*3/uL (ref 0.1–1.0)
NEUTROS ABS: 4.3 10*3/uL (ref 1.7–7.7)
NEUTROS PCT: 61 %

## 2018-08-05 LAB — URINALYSIS, ROUTINE W REFLEX MICROSCOPIC
BILIRUBIN URINE: NEGATIVE
GLUCOSE, UA: NEGATIVE mg/dL
HGB URINE DIPSTICK: NEGATIVE
Ketones, ur: NEGATIVE mg/dL
Leukocytes, UA: NEGATIVE
Nitrite: NEGATIVE
Protein, ur: NEGATIVE mg/dL
Specific Gravity, Urine: 1.019 (ref 1.005–1.030)
pH: 6 (ref 5.0–8.0)

## 2018-08-05 LAB — I-STAT CHEM 8, ED
BUN: 32 mg/dL — ABNORMAL HIGH (ref 8–23)
CHLORIDE: 106 mmol/L (ref 98–111)
Calcium, Ion: 1.08 mmol/L — ABNORMAL LOW (ref 1.15–1.40)
Creatinine, Ser: 1.1 mg/dL — ABNORMAL HIGH (ref 0.44–1.00)
Glucose, Bld: 101 mg/dL — ABNORMAL HIGH (ref 70–99)
HCT: 34 % — ABNORMAL LOW (ref 36.0–46.0)
HEMOGLOBIN: 11.6 g/dL — AB (ref 12.0–15.0)
POTASSIUM: 5.6 mmol/L — AB (ref 3.5–5.1)
Sodium: 139 mmol/L (ref 135–145)
TCO2: 27 mmol/L (ref 22–32)

## 2018-08-05 LAB — PROTIME-INR
INR: 1.08
Prothrombin Time: 13.9 seconds (ref 11.4–15.2)

## 2018-08-05 LAB — CBG MONITORING, ED: Glucose-Capillary: 91 mg/dL (ref 70–99)

## 2018-08-05 LAB — VALPROIC ACID LEVEL

## 2018-08-05 LAB — APTT: aPTT: 30 seconds (ref 24–36)

## 2018-08-05 MED ORDER — ASPIRIN 300 MG RE SUPP
300.0000 mg | Freq: Once | RECTAL | Status: AC
  Administered 2018-08-06: 300 mg via RECTAL
  Filled 2018-08-05: qty 1

## 2018-08-05 MED ORDER — LABETALOL HCL 5 MG/ML IV SOLN
10.0000 mg | Freq: Once | INTRAVENOUS | Status: AC
  Administered 2018-08-05: 10 mg via INTRAVENOUS
  Filled 2018-08-05: qty 4

## 2018-08-05 MED ORDER — ASPIRIN 300 MG RE SUPP
300.0000 mg | Freq: Every day | RECTAL | Status: DC
  Administered 2018-08-06: 300 mg via RECTAL
  Filled 2018-08-05: qty 1

## 2018-08-05 MED ORDER — LEVOTHYROXINE SODIUM 100 MCG IV SOLR
44.0000 ug | Freq: Every day | INTRAVENOUS | Status: DC
  Administered 2018-08-06: 44 ug via INTRAVENOUS
  Filled 2018-08-05: qty 5

## 2018-08-05 MED ORDER — SODIUM CHLORIDE 0.9 % IV SOLN
INTRAVENOUS | Status: DC
  Administered 2018-08-06 (×2): via INTRAVENOUS

## 2018-08-05 MED ORDER — ACETAMINOPHEN 325 MG PO TABS: 650.0000 mg | ORAL_TABLET | ORAL | Status: DC | PRN

## 2018-08-05 MED ORDER — SENNOSIDES-DOCUSATE SODIUM 8.6-50 MG PO TABS: 1.0000 | ORAL_TABLET | Freq: Every evening | ORAL | Status: DC | PRN

## 2018-08-05 MED ORDER — ENOXAPARIN SODIUM 40 MG/0.4ML ~~LOC~~ SOLN
40.0000 mg | SUBCUTANEOUS | Status: DC
  Administered 2018-08-06: 40 mg via SUBCUTANEOUS
  Filled 2018-08-05: qty 0.4

## 2018-08-05 MED ORDER — GADOBUTROL 1 MMOL/ML IV SOLN
7.0000 mL | Freq: Once | INTRAVENOUS | Status: AC | PRN
  Administered 2018-08-05: 7 mL via INTRAVENOUS

## 2018-08-05 MED ORDER — STROKE: EARLY STAGES OF RECOVERY BOOK
Freq: Once | Status: DC
  Filled 2018-08-05: qty 1

## 2018-08-05 MED ORDER — ACETAMINOPHEN 160 MG/5ML PO SOLN: 650.0000 mg | ORAL | Status: DC | PRN

## 2018-08-05 MED ORDER — IOPAMIDOL (ISOVUE-370) INJECTION 76%
50.0000 mL | Freq: Once | INTRAVENOUS | Status: AC | PRN
  Administered 2018-08-05: 50 mL via INTRAVENOUS

## 2018-08-05 MED ORDER — ACETAMINOPHEN 650 MG RE SUPP: 650.0000 mg | RECTAL | Status: DC | PRN

## 2018-08-05 NOTE — ED Triage Notes (Signed)
Pt arrived [facility] from [facility]. LSW 1540, pt developed R facial droop, unequal pupils, and ALOC, responsive to painful stimuli. 18GLLAC, 20G Rwirst BP194/104 p120s 128CBG

## 2018-08-05 NOTE — ED Provider Notes (Addendum)
[facility] EMERGENCY DEPARTMENT Provider Note   CSN: [id number] Arrival date & time: 08/05/18  1647     History   Chief Complaint Chief Complaint  Patient presents with  . Code Stroke    HPI Cathy Johnson is a 84 y.o. female.  Pt presents to the ED today as a code stroke.  Pt was walking to the bathroom with staff when she had a sudden change in consciousness at 1540.  Staff noted a right facial droop as well. She has a hx of stroke, but no deficits from prior stroke according to EMS.  Pt Pt unable to give any hx.  Pt was admitted to [facility] from 10/1[facility]3 for AMS.  No etiology was found.  EMS reported that pupils were initially unequal.       Past Medical History:  Diagnosis Date  . Arthritis   . Dementia ([place])   . Hypertension   . Osteoarthritis of right knee 05/02/2014  . PFO (patent foramen ovale)    PFO by bubble study 02/05/11 TEE  . Stroke [facility])     Patient Active Problem List   Diagnosis Date Noted  . Elevated alkaline phosphatase level 08/06/2018  . Hypothyroidism 08/06/2018  . AMS (altered mental status) 08/05/2018  . Acute CVA (cerebrovascular accident) ([place]) 08/05/2018  . Altered mental status 07/21/2018  . Facial droop 03/06/2017  . AKI (acute kidney injury) ([place]) 01/06/2016  . Syncope 01/06/2016  . Dementia ([place]) 01/06/2016  . Hypertension 01/06/2016  . Anemia 01/06/2016  . Osteoarthritis of right knee 05/02/2014  . Knee osteoarthritis 05/02/2014    Past Surgical History:  Procedure Laterality Date  . BREAST SURGERY     LT BREAST MASS REMOVED  . CARPAL TUNNEL RELEASE    . KNEE ARTHROSCOPY     RIGHT  . PARTIAL KNEE ARTHROPLASTY Right 05/02/2014   Procedure: UNICOMPARTMENTAL KNEE;  Surgeon: Johnny Bridge, MD;  Location: [facility];  Service: Orthopedics;  Laterality: Right;     OB History   None      Home Medications    Prior to Admission medications   Medication Sig Start Date End Date Taking? Authorizing  Provider  acetaminophen (TYLENOL) 500 MG tablet Take 1,000 mg by mouth 2 (two) times daily.   Yes [provider]  aspirin 81 MG chewable tablet Chew 81 mg by mouth daily.   Yes [provider]  atorvastatin (LIPITOR) 20 MG tablet Take 20 mg by mouth at bedtime.    Yes [provider]  bisacodyl (FLEET) 10 MG/30ML ENEM Place 10 mg rectally as needed (for constipation/if no relief or abdominal pain, CALL MD).    Yes [provider]  calcium carbonate (CALCI-CHEW) 1250 (500 Ca) MG chewable tablet Chew 1 tablet by mouth 2 (two) times daily.   Yes [provider]  Cholecalciferol (VITAMIN D3) 2000 units TABS Take 2,000 Units by mouth daily.   Yes [provider]  clopidogrel (PLAVIX) 75 MG tablet Take 1 tablet (75 mg total) by mouth daily. 03/09/17  Yes Gladstone Lighter, MD  diclofenac sodium (VOLTAREN) 1 % GEL Apply 4 g topically See admin instructions. Apply 4 grams to both knees two times a day and an additional 4 grams to both knees two times a day as needed for arthritis pain   Yes [provider]  ferrous sulfate 325 (65 FE) MG tablet Take 325 mg by mouth daily with breakfast.   Yes [provider]  guaifenesin (ROBITUSSIN) 100  MG/5ML syrup Take 200 mg by mouth every 6 (six) hours as needed (for 3 days, for coughing and notify MD if coughs persists for more than 3 days).    Yes [provider]  levothyroxine (SYNTHROID, LEVOTHROID) 88 MCG tablet Take 88 mcg by mouth daily before breakfast.   Yes [provider]  lisinopril (PRINIVIL,ZESTRIL) 40 MG tablet Take 40 mg by mouth daily.   Yes [provider]  loperamide (IMODIUM A-D) 2 MG tablet Take 2 mg by mouth See admin instructions. Take 2 mg by mouth after each loose stool as needed/max humber of doses, up to 3/12 hours and notify MD if no relief after 24 hrs   Yes [provider]  LORazepam (ATIVAN) 0.5 MG tablet Take 1 tablet (0.5 mg total)  by mouth 2 (two) times daily. Patient taking differently: Take 0.5 mg by mouth See admin instructions. Take 0.5 mg by mouth two times a day- at 2 PM and 8 PM and an additional 0.5 mg two times a day as needed for agitation or anxiety 03/08/17  Yes Gladstone Lighter, MD  memantine (NAMENDA) 10 MG tablet Take 10 mg by mouth 2 (two) times daily.   Yes [provider]  polyethylene glycol (MIRALAX / GLYCOLAX) packet Take 17 g by mouth daily.   Yes [provider]  risperiDONE (RISPERDAL) 0.5 MG tablet Take 0.5 mg by mouth See admin instructions. Take 0.5 mg by mouth two times a day- at 2 PM and 8 PM   Yes [provider]  sertraline (ZOLOFT) 50 MG tablet Take 75 mg by mouth daily.    Yes [provider]  telmisartan (MICARDIS) 80 MG tablet Take 80 mg by mouth daily.   Yes [provider]  vitamin B-12 (CYANOCOBALAMIN) 500 MCG tablet Take 500 mcg by mouth daily.    Yes [provider]    Family History Family History  Problem Relation Age of Onset  . Hypertension Mother   . Hypertension Father     Social History Social History   Tobacco Use  . Smoking status: Never Smoker  . Smokeless tobacco: Never Used  Substance Use Topics  . Alcohol use: No  . Drug use: No     Allergies   Boniva [ibandronic acid]; Crestor [rosuvastatin calcium]; Exelon [rivastigmine tartrate]; Fosamax [alendronate sodium]; Galantamine; Lipitor [atorvastatin]; Morphine and related; Other; and Vagifem [estradiol]   Review of Systems Review of Systems  Unable to perform ROS: Mental status change     Physical Exam Updated Vital Signs BP (!) 167/69   Pulse 87   Temp 98.6 F (37 C) (Oral)   Resp 15   Ht 5\' 4"  (1.626 m)   Wt 73.8 kg   SpO2 97%   BMI 27.93 kg/m   Physical Exam  Constitutional: She appears well-developed and well-nourished. She appears lethargic.  HENT:  Head: Normocephalic and atraumatic.  Right Ear: External ear normal.  Left Ear:  External ear normal.  Nose: Nose normal.  Mouth/Throat: Oropharynx is clear and moist.  Eyes: Pupils are equal, round, and reactive to light. Conjunctivae and EOM are normal.  Neck: Normal range of motion. Neck supple.  Cardiovascular: Normal rate, regular rhythm, normal heart sounds and intact distal pulses.  Pulmonary/Chest: Effort normal and breath sounds normal.  Abdominal: Soft. Bowel sounds are normal.  Musculoskeletal: Normal range of motion.  Neurological: She appears lethargic.  Pt will localize to sternal rub.  She is moving all 4 extremities.  She is not  following commands.  Skin: Skin is warm. Capillary refill takes less than 2 seconds.  Nursing note and vitals reviewed.    ED Treatments / Results  Labs (all labs ordered are listed, but only abnormal results are displayed) Labs Reviewed  CBC - Abnormal; Notable for the following components:      Result Value   RBC 3.71 (*)    Hemoglobin 10.7 (*)    HCT 35.2 (*)    RDW 16.0 (*)    All other components within normal limits  COMPREHENSIVE METABOLIC PANEL - Abnormal; Notable for the following components:   Glucose, Bld 102 (*)    Creatinine, Ser 1.07 (*)    Total Protein 6.2 (*)    Alkaline Phosphatase 131 (*)    GFR calc non Af Amer 45 (*)    GFR calc Af Amer 52 (*)    All other components within normal limits  VALPROIC ACID LEVEL - Abnormal; Notable for the following components:   Valproic Acid Lvl <10 (*)    All other components within normal limits  URINALYSIS, ROUTINE W REFLEX MICROSCOPIC - Abnormal; Notable for the following components:   Color, Urine STRAW (*)    All other components within normal limits  COMPREHENSIVE METABOLIC PANEL - Abnormal; Notable for the following components:   Calcium 8.7 (*)    Total Protein 5.5 (*)    Albumin 3.3 (*)    GFR calc non Af Amer 49 (*)    GFR calc Af Amer 57 (*)    All other components within normal limits  I-STAT CHEM 8, ED - Abnormal; Notable for the following  components:   Potassium 5.6 (*)    BUN 32 (*)    Creatinine, Ser 1.10 (*)    Glucose, Bld 101 (*)    Calcium, Ion 1.08 (*)    Hemoglobin 11.6 (*)    HCT 34.0 (*)    All other components within normal limits  PROTIME-INR  APTT  DIFFERENTIAL  AMMONIA  HEMOGLOBIN A1C  LIPID PANEL  I-STAT TROPONIN, ED  CBG MONITORING, ED    EKG EKG Interpretation  Date/Time:  Thursday August 05 2018 17:08:09 EDT Ventricular Rate:  83 PR Interval:    QRS Duration: 94 QT Interval:  396 QTC Calculation: 466 R Axis:   14 Text Interpretation:  Sinus arrhythmia Low voltage, precordial leads RSR' in V1 or V2, right VCD or RVH No significant change since last tracing Confirmed by Isla Pence [phone]) on 08/05/2018 5:18:50 PM Also confirmed by Isla Pence [phone]), editor Hattie Perch ([id number])  on 08/06/2018 7:12:07 AM   Radiology No results found.  Procedures Procedures (including critical care time)  Medications Ordered in ED Medications  iopamidol (ISOVUE-370) 76 % injection 50 mL (50 mLs Intravenous Contrast Given 08/05/18 1701)  labetalol (NORMODYNE,TRANDATE) injection 10 mg (10 mg Intravenous Given 08/05/18 1921)  gadobutrol (GADAVIST) 1 MMOL/ML injection 7 mL (7 mLs Intravenous Contrast Given 08/05/18 2120)  aspirin suppository 300 mg (300 mg Rectal Given 08/06/18 0204)     Initial Impression / Assessment and Plan / ED Course  I have reviewed the triage vital signs and the nursing notes.  Pertinent labs & imaging results that were available during my care of the patient were reviewed by me and considered in my medical decision making (see chart for details).     Neurology saw pt at the bridge.  CT head showed atrophy.  Nothing acute.  Neuro cancelled the code stroke.  However, they did recommend a  MRI.  Pt's BP elevated (sbp >200, so she was given labetalol which has decreased it to 160.).    No pna or UTI, although pt has had a cough.  Depakote level is <10 and it's  listed on her med list.  ? Whether she's been taking her meds.  Etiology of ms change unclear.  MRI is pending.  Pt d/w Dr. Marlowe Sax (triad) for admission.  CRITICAL CARE Performed by: Isla Pence   Total critical care time: 30 minutes  Critical care time was exclusive of separately billable procedures and treating other patients.  Critical care was necessary to treat or prevent imminent or life-threatening deterioration.  Critical care was time spent personally by me on the following activities: development of treatment plan with patient and/or surrogate as well as nursing, discussions with consultants, evaluation of patient's response to treatment, examination of patient, obtaining history from patient or surrogate, ordering and performing treatments and interventions, ordering and review of laboratory studies, ordering and review of radiographic studies, pulse oximetry and re-evaluation of patient's condition.  Final Clinical Impressions(s) / ED Diagnoses   Final diagnoses:  Altered mental status, unspecified altered mental status type  Malignant hypertension  Dementia without behavioral disturbance, unspecified dementia type [facility])    ED Discharge Orders         Ordered    Discharge instructions    Comments:  Patient will be discharged to skilled nursing facility, physical, occupational, speech therapy.  Patient will need to follow up with primary care provider within one week of discharge.  Follow up with neurology. Patient should continue medications as prescribed.  Patient should follow a dysphagia 2 diet.   Patient needs to follow up closely with PCP for up to date medication list.   08/06/18 1613           Isla Pence, MD 08/05/18 2042    Isla Pence, MD 08/11/18 [place]

## 2018-08-05 NOTE — Consult Note (Addendum)
Neurology Consultation  Reason for Consult: Code stroke Referring Physician: Gilford Raid  CC: Altered mental status  History is obtained from: EMS  HPI: Cathy Johnson is a 84 y.o. female with history of dementia, PFO and hypertension.  Patient also has a history of dementia.  EMS was called to nursing home secondary to the fact that patient was found slumped over in her bathroom at 1540.  The patient had a right facial droop and was leaned over to the right and unresponsive other than to sternal rub.  Patient was brought to [facility] noted that the pupils were unequal however on arrival they were only approximately 1 mm difference.  Patient would localize to sternal rub, was able to hold both arms up, and was able to withdraw both legs while on the bridge.  She still remained uncommunicative.  Patient went to CT which showed no acute intracranial bleed or stroke.  LKW: [id number]  tpa given?: No, most likely component of the DDx for her presentation is toxic/metabolic encephalopathy or infection. Risks of complications with tPA significantly outweigh benefits given that an etiology other than stroke is felt to be most likely - risks include, but are not restricted to [place] and allergic reaction Premorbid modified Rankin scale (mRS): 0 NIH stroke scale of 11  ROS: Unable to obtain due to altered mental status.   Past Medical History:  Diagnosis Date  . Arthritis   . Dementia ([place])   . Hypertension   . Osteoarthritis of right knee 05/02/2014  . PFO (patent foramen ovale)    PFO by bubble study 02/05/11 [facility]  . Stroke [facility])      Family History  Problem Relation Age of Onset  . Hypertension Mother   . Hypertension Father      Social History:   reports that she has never smoked. She has never used smokeless tobacco. She reports that she does not drink alcohol or use drugs.  Medications No current facility-administered medications for this encounter.   Current  Outpatient Medications:  .  acetaminophen (TYLENOL) 650 MG CR tablet, Take 1,300 mg by mouth every 8 (eight) hours as needed for pain., Disp: , Rfl:  .  aspirin 81 MG tablet, Take 81 mg by mouth daily. , Disp: , Rfl:  .  atorvastatin (LIPITOR) 20 MG tablet, Take 20 mg by mouth daily at 6 PM. , Disp: , Rfl:  .  bisacodyl (FLEET) 10 MG/30ML ENEM, Place 10 mg rectally as needed., Disp: , Rfl:  .  Cholecalciferol (VITAMIN D) 2000 units tablet, Take 2,000 Units by mouth daily., Disp: , Rfl:  .  clopidogrel (PLAVIX) 75 MG tablet, Take 1 tablet (75 mg total) by mouth daily., Disp: 30 tablet, Rfl: 1 .  diclofenac sodium (VOLTAREN) 1 % GEL, Apply 4 g topically 2 (two) times daily., Disp: , Rfl:  .  divalproex (DEPAKOTE) 250 MG DR tablet, Take 250 mg by mouth 2 (two) times daily. , Disp: , Rfl:  .  guaifenesin (ROBITUSSIN) 100 MG/5ML syrup, Take 200 mg by mouth 4 (four) times daily as needed for cough., Disp: , Rfl:  .  levothyroxine (SYNTHROID, LEVOTHROID) 25 MCG tablet, Take 25 mcg by mouth daily., Disp: , Rfl:  .  lisinopril (PRINIVIL,ZESTRIL) 5 MG tablet, Take 5 mg by mouth daily., Disp: , Rfl:  .  loperamide (IMODIUM A-D) 2 MG tablet, Take 2 mg by mouth as needed for diarrhea or loose stools., Disp: , Rfl:  .  LORazepam (ATIVAN) 0.5 MG tablet, Take 1 tablet (0.5 mg total) by mouth 2 (two) times daily. (Patient taking differently: Take 0.5 mg by mouth 2 (two) times daily as needed for anxiety. ), Disp: 20 tablet, Rfl: 0 .  memantine (NAMENDA) 10 MG tablet, Take 10 mg by mouth 2 (two) times daily., Disp: , Rfl:  .  polyethylene glycol (MIRALAX / GLYCOLAX) packet, Take 17 g by mouth daily., Disp: , Rfl:  .  risperiDONE (RISPERDAL) 1 MG tablet, Take 1 mg by mouth at bedtime. , Disp: , Rfl:  .  sertraline (ZOLOFT) 50 MG tablet, Take 50 mg by mouth daily., Disp: , Rfl:  .  telmisartan (MICARDIS) 40 MG tablet, Take 40 mg by mouth daily. , Disp: , Rfl:  .  vitamin B-12 (CYANOCOBALAMIN) 500 MCG tablet, Take 500  mcg by mouth daily. , Disp: , Rfl:    Exam: Current vital signs: There were no vitals taken for this visit. Vital signs in last 24 hours:    Physical Exam  Constitutional: Appears well-developed and well-nourished.  Psych: At this point obtunded Eyes: No scleral injection HENT: No OP obstrucion Head: Normocephalic.  Cardiovascular: Normal rate and regular rhythm.  Respiratory: Effort normal, non-labored breathing GI: Soft.  No distension. There is no tenderness.  Skin: WDI  Neuro: Mental Status: Patient extremely drowsy, does not follow commands, does localize to pain, coughing Patient is unable to give a history Unable to test for aphasia but does have some dysarthria as she is talking but not making sense Cranial Nerves: II: Blinks to threat bilaterally .   III,IV, VI: Doll's positive.  Pupils are 1 mm difference , round, and reactive to light. V: Facial sensation is symmetric to temperature VII: Slight right facial droop. Her son who is in the room states that her last stroke did give her a right facial droop.  VIII: hearing is intact to voice Motor: Able to hold both arms antigravity and withdraws from noxious stimuli with 3-4/5 strength in the lower extremities. No asymmetry noted.  Sensory: Sensation is symmetric to noxious stimuli Deep Tendon Reflexes: 2+ and symmetric in the biceps, 1+ in the patellae, no ankle jerks Plantars: Toes are downgoing bilaterally.  Cerebellar: Unable to examine secondary to patient being significantly drowsy Gait: Unable to assess  Labs I have reviewed labs in epic and the results pertinent to this consultation are:   CBC    Component Value Date/Time   WBC 7.0 08/05/2018 1648   RBC 3.71 (L) 08/05/2018 1648   HGB 10.7 (L) 08/05/2018 1648   HCT 35.2 (L) 08/05/2018 1648   PLT 235 08/05/2018 1648   MCV 94.9 08/05/2018 1648   MCH 28.8 08/05/2018 1648   MCHC 30.4 08/05/2018 1648   RDW 16.0 (H) 08/05/2018 1648   LYMPHSABS 1.7  08/05/2018 1648   MONOABS 0.8 08/05/2018 1648   EOSABS 0.3 08/05/2018 1648   BASOSABS 0.0 08/05/2018 1648    CMP     Component Value Date/Time   NA 142 07/22/2018 0608   K 3.9 07/22/2018 0608   CL 107 07/22/2018 0608   CO2 29 07/22/2018 0608   GLUCOSE 98 07/22/2018 0608   BUN 14 07/22/2018 0608   CREATININE 0.81 07/22/2018 0608   CALCIUM 8.7 (L) 07/22/2018 0608   PROT 6.0 (L) 07/20/2018 2333   ALBUMIN 3.5 07/20/2018 2333   AST 28 07/20/2018 2333   ALT 21 07/20/2018 2333   ALKPHOS 137 (H) 07/20/2018 2333   BILITOT 0.3 07/20/2018 2333  GFRNONAA >60 07/22/2018 0608   GFRAA >60 07/22/2018 0608    Lipid Panel     Component Value Date/Time   CHOL 105 03/07/2017 0508   TRIG 31 03/07/2017 0508   HDL 51 03/07/2017 0508   CHOLHDL 2.1 03/07/2017 0508   VLDL 6 03/07/2017 0508   LDLCALC 48 03/07/2017 0508     Imaging I have reviewed the images obtained:  CT-scan of the brain--no acute findings on CT scan other than generalized atrophy, frontal and temporal predominant, and old lacunar infarct in the right thalamus  CTA head and neck: No likely significant vascular disease. Minimal atherosclerotic change at both carotid bifurcations without stenosis or irregularity. No intracranial branch vessel stenosis or occlusion Identified. Minimal fibromuscular change of the cervical ICA on the right beneath the skull base, probably not relevant.Marland Kitchen  MRI examination of the brain-pending  Etta Quill PA-C [facility] [phone] M-F  (9:00 am- 5:00 PM) 08/05/2018, 5:02 PM    Assessment:  84 year old female presenting to the hospital as code stroke secondary to altered mental status.  On arrival patient had 1 mm difference in pupils but equally reactive patient was very drowsy and not following commands but did not show any significant lateralizing abnormalities other than a right facial droop which significantly improved when head was held in a straight position.   1. The  most likely component of the DDx for her presentation is toxic/metabolic encephalopathy or infection. The overall presentation of AMS is not likely to be a stroke without lateralizing abnormalities.    Recommendations: - Evaluate for toxic/metabolic issues that could cause encephalopathy -- Evaluate for possible infection - Check ammonia and LFTs - Obtain MRI of brain to evaluate for possible stroke.  If positive, continue with stroke work-up.   I have seen and examined the patient. I have formulated the assessment and recommendations and amended the documented neurological examination.  Electronically signed: Dr. Kerney Elbe

## 2018-08-05 NOTE — H&P (Signed)
History and Physical    Cathy Johnson:[id number] DOB: 02-23-1928 DOA: 08/05/2018  PCP: Orvis Brill, [facility] Patient coming from: Nursing home  Chief Complaint: AMS  HPI: Cathy Johnson is a 84 y.o. female with medical history significant of dementia, hypertension, stroke presenting to the hospital from her nursing home as a code stroke.  Patient was extremely drowsy and no history could be obtained from her.  Per documentation from the ED, patient was walking to the bathroom with staff at the nursing home when she had a sudden change in consciousness at 1540.  Staff noted right facial droop as well.  She has a history of prior stroke with no residual deficits. Patient was admitted to [facility] regional from October 1-3 for altered mental status which was thought to be likely secondary to worsening of her dementia.  No infectious source was found.  ED Course: On arrival, temperature 97.5 F, pulse 91, respiratory rate 24, blood pressure 149/99, SPO2 98% on room air.  Subsequent blood pressure readings as high as 202/117.  Labs showing no leukocytosis.  CBG 91.  INR 1.0.  Valproic acid level negative.  AST and ALT normal.  I-STAT troponin negative.  En route EMS noted that the pupils were unequal but neurology noted only approximately 1 mm difference.  CT head showing generalized atrophy, frontal and temporal predominant and old lacunar infarction in the right thalamus; no acute finding.  CT angiogram head and neck showing no acute brain finding, no likely significant vascular disease, and minimal fibromuscular change of the cervical ICA on the right beneath the skull base.  Chest x-ray showing no active disease.  Patient received IV labetalol 10 mg in the ED.  TRH paged to admit.  Review of Systems: As per HPI otherwise 10 point review of systems negative.  Past Medical History:  Diagnosis Date  . Arthritis   . Dementia ([place])   . Hypertension   . Osteoarthritis of right knee  05/02/2014  . PFO (patent foramen ovale)    PFO by bubble study 02/05/11 TEE  . Stroke [facility])     Past Surgical History:  Procedure Laterality Date  . BREAST SURGERY     LT BREAST MASS REMOVED  . CARPAL TUNNEL RELEASE    . KNEE ARTHROSCOPY     RIGHT  . PARTIAL KNEE ARTHROPLASTY Right 05/02/2014   Procedure: UNICOMPARTMENTAL KNEE;  Surgeon: Johnny Bridge, MD;  Location: [place];  Service: Orthopedics;  Laterality: Right;     reports that she has never smoked. She has never used smokeless tobacco. She reports that she does not drink alcohol or use drugs.  Allergies  Allergen Reactions  . Boniva [Ibandronic Acid] Other (See Comments)    "Allergic," per MAR  . Crestor [Rosuvastatin Calcium] Other (See Comments)    "Allergic," per MAR  . Exelon [Rivastigmine Tartrate] Other (See Comments)    "Allergic," per MAR  . Fosamax [Alendronate Sodium] Other (See Comments)    "Allergic," per MAR  . Galantamine Other (See Comments)    "Allergic," per MAR  . Lipitor [Atorvastatin] Other (See Comments)    "Allergic," per MAR  . Morphine And Related Other (See Comments)    "Allergic," per MAR  . Other Other (See Comments)    "Sedatives" and "Multiple, unknown B/P meds" = "Allergic," per MAR  . Vagifem [Estradiol] Other (See Comments)    "Allergic," per [facility]    Family History  Problem Relation Age of Onset  . Hypertension Mother   .  Hypertension Father     Prior to Admission medications   Medication Sig Start Date End Date Taking? Authorizing Provider  acetaminophen (TYLENOL) 325 MG tablet Take 650 mg by mouth as needed (for pain, fever or headaches).   Yes [provider]  acetaminophen (TYLENOL) 500 MG tablet Take 1,000 mg by mouth 2 (two) times daily.   Yes [provider]  aspirin 81 MG chewable tablet Chew 81 mg by mouth daily.   Yes [provider]  atorvastatin (LIPITOR) 20 MG tablet Take 20 mg by mouth at bedtime.    Yes [provider]    bisacodyl (FLEET) 10 MG/30ML ENEM Place 10 mg rectally as needed (for constipation/if no relief or abdominal pain, CALL MD).    Yes [provider]  calcium carbonate (CALCI-CHEW) 1250 (500 Ca) MG chewable tablet Chew 1 tablet by mouth 2 (two) times daily.   Yes [provider]  Cholecalciferol (VITAMIN D3) 2000 units TABS Take 2,000 Units by mouth daily.   Yes [provider]  clopidogrel (PLAVIX) 75 MG tablet Take 1 tablet (75 mg total) by mouth daily. 03/09/17  Yes Gladstone Lighter, MD  diclofenac sodium (VOLTAREN) 1 % GEL Apply 4 g topically See admin instructions. Apply 4 grams to both knees two times a day and an additional 4 grams to both knees two times a day as needed for arthritis pain   Yes [provider]  ferrous sulfate 325 (65 FE) MG tablet Take 325 mg by mouth daily with breakfast.   Yes [provider]  guaifenesin (ROBITUSSIN) 100 MG/5ML syrup Take 200 mg by mouth every 6 (six) hours as needed (for 3 days, for coughing and notify MD if coughs persists for more than 3 days).    Yes [provider]  levothyroxine (SYNTHROID, LEVOTHROID) 88 MCG tablet Take 88 mcg by mouth daily before breakfast.   Yes [provider]  lisinopril (PRINIVIL,ZESTRIL) 40 MG tablet Take 40 mg by mouth daily.   Yes [provider]  loperamide (IMODIUM A-D) 2 MG tablet Take 2 mg by mouth See admin instructions. Take 2 mg by mouth after each loose stool as needed/max humber of doses, up to 3/12 hours and notify MD if no relief after 24 hrs   Yes [provider]  LORazepam (ATIVAN) 0.5 MG tablet Take 1 tablet (0.5 mg total) by mouth 2 (two) times daily. Patient taking differently: Take 0.5 mg by mouth See admin instructions. Take 0.5 mg by mouth two times a day- at 2 PM and 8 PM and an additional 0.5 mg two times a day as needed for agitation or anxiety 03/08/17  Yes Gladstone Lighter, MD  memantine (NAMENDA) 10 MG tablet Take 10 mg  by mouth 2 (two) times daily.   Yes [provider]  polyethylene glycol (MIRALAX / GLYCOLAX) packet Take 17 g by mouth daily.   Yes [provider]  risperiDONE (RISPERDAL) 0.5 MG tablet Take 0.5 mg by mouth See admin instructions. Take 0.5 mg by mouth two times a day- at 2 PM and 8 PM   Yes [provider]  sertraline (ZOLOFT) 50 MG tablet Take 75 mg by mouth daily.    Yes [provider]  telmisartan (MICARDIS) 80 MG tablet Take 80 mg by mouth daily.   Yes [provider]  vitamin B-12 (CYANOCOBALAMIN) 500 MCG tablet Take 500 mcg by mouth daily.    Yes [provider]  divalproex (DEPAKOTE) 250 MG DR tablet  Take 250 mg by mouth 2 (two) times daily.     [provider]    Physical Exam: Vitals:   08/05/18 2315 08/05/18 2330 08/06/18 0015 08/06/18 0113  BP: 116/69 (!) 143/87 125/63 (!) 154/94  Pulse: 84 76 75 82  Resp: '16 18 17   ' Temp:    (!) 97.4 F (36.3 C)  TempSrc:    Oral  SpO2: 95% 97% 95% 96%  Weight:      Height:       Physical Exam  Constitutional:  Coughing  HENT:  Head: Normocephalic.  Eyes: Right eye exhibits no discharge. Left eye exhibits no discharge.  Neck: Neck supple. No tracheal deviation present.  Cardiovascular: Normal rate, regular rhythm and intact distal pulses.  Pulmonary/Chest:  Anterior lung fields clear to auscultation.  Abdominal: Soft. Bowel sounds are normal. She exhibits no distension. There is no tenderness.  Musculoskeletal: She exhibits no edema.  Neurological:  Extremely drowsy.  Not following commands.  Grimaces to painful stimuli.   Skin: Skin is warm and dry. She is not diaphoretic.   Labs on Admission: I have personally reviewed following labs and imaging studies  CBC: Recent Labs  Lab 08/05/18 1648 08/05/18 1700  WBC 7.0  --   NEUTROABS 4.3  --   HGB 10.7* 11.6*  HCT 35.2* 34.0*  MCV 94.9  --   PLT 235  --    Basic Metabolic Panel: Recent Labs  Lab  08/05/18 1648 08/05/18 1700  NA 140 139  K 4.0 5.6*  CL 106 106  CO2 23  --   GLUCOSE 102* 101*  BUN 20 32*  CREATININE 1.07* 1.10*  CALCIUM 9.2  --    GFR: Estimated Creatinine Clearance: 34.8 mL/min (A) (by C-G formula based on SCr of 1.1 mg/dL (H)). Liver Function Tests: Recent Labs  Lab 08/05/18 1648  AST 19  ALT 19  ALKPHOS 131*  BILITOT 0.4  PROT 6.2*  ALBUMIN 3.7   No results for input(s): LIPASE, AMYLASE in the last 168 hours. Recent Labs  Lab 08/05/18 1947  AMMONIA 13   Coagulation Profile: Recent Labs  Lab 08/05/18 1648  INR 1.08   Cardiac Enzymes: No results for input(s): CKTOTAL, CKMB, CKMBINDEX, TROPONINI in the last 168 hours. BNP (last 3 results) No results for input(s): PROBNP in the last 8760 hours. HbA1C: No results for input(s): HGBA1C in the last 72 hours. CBG: Recent Labs  Lab 08/05/18 1648  GLUCAP 91   Lipid Profile: No results for input(s): CHOL, HDL, LDLCALC, TRIG, CHOLHDL, LDLDIRECT in the last 72 hours. Thyroid Function Tests: No results for input(s): TSH, T4TOTAL, FREET4, T3FREE, THYROIDAB in the last 72 hours. Anemia Panel: No results for input(s): VITAMINB12, FOLATE, FERRITIN, TIBC, IRON, RETICCTPCT in the last 72 hours. Urine analysis:    Component Value Date/Time   COLORURINE STRAW (A) 08/05/2018 1944   APPEARANCEUR CLEAR 08/05/2018 1944   LABSPEC 1.019 08/05/2018 1944   PHURINE 6.0 08/05/2018 1944   GLUCOSEU NEGATIVE 08/05/2018 1944   HGBUR NEGATIVE 08/05/2018 1944   BILIRUBINUR NEGATIVE 08/05/2018 1944   KETONESUR NEGATIVE 08/05/2018 1944   PROTEINUR NEGATIVE 08/05/2018 1944   UROBILINOGEN 0.2 02/01/2011 1932   NITRITE NEGATIVE 08/05/2018 1944   LEUKOCYTESUR NEGATIVE 08/05/2018 1944    Radiological Exams on Admission: Ct Angio Head W Or Wo Contrast  Result Date: 08/05/2018 CLINICAL DATA:  Code stroke. Right-sided facial droop. Negative acute head CT. EXAM: CT ANGIOGRAPHY HEAD AND NECK TECHNIQUE: Multidetector  CT imaging of  the head and neck was performed using the standard protocol during bolus administration of intravenous contrast. Multiplanar CT image reconstructions and MIPs were obtained to evaluate the vascular anatomy. Carotid stenosis measurements (when applicable) are obtained utilizing NASCET criteria, using the distal internal carotid diameter as the denominator. CONTRAST:  28m ISOVUE-370 IOPAMIDOL (ISOVUE-370) INJECTION 76% COMPARISON:  Head CT earlier same day and multiple previous. FINDINGS: CTA NECK FINDINGS Aortic arch: Aortic atherosclerosis. No aneurysm. Branching pattern is normal. Right carotid system: Common carotid artery widely patent to the bifurcation. Mild atherosclerotic plaque at the carotid bifurcation but no stenosis or irregularity. Cervical ICA is tortuous. Mild fibromuscular change beneath the skull base. Left carotid system: Common carotid artery widely patent to the bifurcation. Minimal calcified plaque at the carotid bifurcation but no stenosis or irregularity. Cervical ICA is tortuous but unremarkable otherwise. Vertebral arteries: No subclavian disease of significance. Both vertebral artery origins appear sufficiently patent. Left vertebral artery is dominant. Both vertebral arteries are patent through the cervical region to the foramen magnum. Skeleton: Chronic degenerative spondylosis. Other neck: Thyroid goiter.  No other soft tissue finding. Upper chest: Negative Review of the MIP images confirms the above findings CTA HEAD FINDINGS Anterior circulation: Both internal carotid arteries are patent through the skull base and siphon regions. The anterior and middle cerebral vessels are patent without proximal stenosis, aneurysm or vascular malformation. No missing branch vessel identified. Posterior circulation: Both vertebral arteries are patent to the basilar. No basilar stenosis. Posterior circulation branch vessels are patent. Posterior cerebral arteries receive most of the  supply from the anterior circulation. Venous sinuses: Patent and normal. Anatomic variants: None significant. Delayed phase: No abnormal enhancement. Review of the MIP images confirms the above findings IMPRESSION: No acute brain finding identified. No likely significant vascular disease. Minimal atherosclerotic change at both carotid bifurcations without stenosis or irregularity. No intracranial branch vessel stenosis or occlusion identified. Minimal fibromuscular change of the cervical ICA on the right beneath the skull base, probably not relevant. These results were communicated to Dr. LCheral MarkerAt 5:33 pmon 10/17/2019by text page via the [facility]messaging system. Electronically Signed   By: MNelson ChimesM.D.   On: 08/05/2018 17:35   Ct Angio Neck W Or Wo Contrast  Result Date: 08/05/2018 CLINICAL DATA:  Code stroke. Right-sided facial droop. Negative acute head CT. EXAM: CT ANGIOGRAPHY HEAD AND NECK TECHNIQUE: Multidetector CT imaging of the head and neck was performed using the standard protocol during bolus administration of intravenous contrast. Multiplanar CT image reconstructions and MIPs were obtained to evaluate the vascular anatomy. Carotid stenosis measurements (when applicable) are obtained utilizing NASCET criteria, using the distal internal carotid diameter as the denominator. CONTRAST:  586mISOVUE-370 IOPAMIDOL (ISOVUE-370) INJECTION 76% COMPARISON:  Head CT earlier same day and multiple previous. FINDINGS: CTA NECK FINDINGS Aortic arch: Aortic atherosclerosis. No aneurysm. Branching pattern is normal. Right carotid system: Common carotid artery widely patent to the bifurcation. Mild atherosclerotic plaque at the carotid bifurcation but no stenosis or irregularity. Cervical ICA is tortuous. Mild fibromuscular change beneath the skull base. Left carotid system: Common carotid artery widely patent to the bifurcation. Minimal calcified plaque at the carotid bifurcation but no stenosis or  irregularity. Cervical ICA is tortuous but unremarkable otherwise. Vertebral arteries: No subclavian disease of significance. Both vertebral artery origins appear sufficiently patent. Left vertebral artery is dominant. Both vertebral arteries are patent through the cervical region to the foramen magnum. Skeleton: Chronic degenerative spondylosis. Other neck: Thyroid goiter.  No other soft tissue  finding. Upper chest: Negative Review of the MIP images confirms the above findings CTA HEAD FINDINGS Anterior circulation: Both internal carotid arteries are patent through the skull base and siphon regions. The anterior and middle cerebral vessels are patent without proximal stenosis, aneurysm or vascular malformation. No missing branch vessel identified. Posterior circulation: Both vertebral arteries are patent to the basilar. No basilar stenosis. Posterior circulation branch vessels are patent. Posterior cerebral arteries receive most of the supply from the anterior circulation. Venous sinuses: Patent and normal. Anatomic variants: None significant. Delayed phase: No abnormal enhancement. Review of the MIP images confirms the above findings IMPRESSION: No acute brain finding identified. No likely significant vascular disease. Minimal atherosclerotic change at both carotid bifurcations without stenosis or irregularity. No intracranial branch vessel stenosis or occlusion identified. Minimal fibromuscular change of the cervical ICA on the right beneath the skull base, probably not relevant. These results were communicated to Dr. Cheral Marker At 5:33 pmon 10/17/2019by text page via the [facility] messaging system. Electronically Signed   By: Nelson Chimes M.D.   On: 08/05/2018 17:35   Mr Jeri Cos And Wo Contrast  Result Date: 08/05/2018 CLINICAL DATA:  Right facial droop and altered mental status. EXAM: MRI HEAD WITHOUT AND WITH CONTRAST TECHNIQUE: Multiplanar, multiecho pulse sequences of the brain and surrounding structures were  obtained without and with intravenous contrast. CONTRAST:  7 mL Gadavist COMPARISON:  CTA head and neck 08/05/2018 FINDINGS: BRAIN: Small focus of abnormal diffusion restriction within the medial left thalamus. No other diffusion abnormality. The midline structures are normal. No midline shift or other mass effect. Multiple old cerebellar infarcts. Old infarcts of the left basal ganglia and right thalamus. Multifocal white matter hyperintensity, most commonly due to chronic ischemic microangiopathy. Generalized atrophy without lobar predilection. Scattered chronic microhemmorhages in a peripheral predominant distribution. No VASCULAR: Major intracranial arterial and venous sinus flow voids are normal. SKULL AND UPPER CERVICAL SPINE: Calvarial bone marrow signal is normal. There is no skull base mass. Visualized upper cervical spine and soft tissues are normal. SINUSES/ORBITS: No fluid levels or advanced mucosal thickening. No mastoid or middle ear effusion. The orbits are normal. IMPRESSION: 1. Small acute or early subacute infarct of the medial right thalamus. No hemorrhage or mass effect. 2. Chronic ischemic microangiopathy and generalized atrophy. 3. 3-5 scattered chronic microhemorrhages in a peripheral distribution, nonspecific. Electronically Signed   By: Ulyses Jarred M.D.   On: 08/05/2018 21:45   Dg Chest Portable 1 View  Result Date: 08/05/2018 CLINICAL DATA:  Fatigue.  Mental status changes. EXAM: PORTABLE CHEST 1 VIEW COMPARISON:  07/20/2018 FINDINGS: Borderline cardiomegaly. Chronic aortic atherosclerosis. Lungs appear clear. The vascularity is normal. No effusions. No visible bone abnormality. IMPRESSION: No active disease. Electronically Signed   By: Nelson Chimes M.D.   On: 08/05/2018 17:59   Ct Head Code Stroke Wo Contrast  Result Date: 08/05/2018 CLINICAL DATA:  Code stroke.  Slurred speech.  Right facial droop. EXAM: CT HEAD WITHOUT CONTRAST TECHNIQUE: Contiguous axial images were obtained  from the base of the skull through the vertex without intravenous contrast. COMPARISON:  07/20/2018.  Multiple previous. FINDINGS: Brain: Generalized atrophy with frontal and temporal predominance. No sign of acute infarction, mass lesion, hemorrhage, hydrocephalus or extra-axial collection. Old lacunar infarction right thalamus. Vascular: There is atherosclerotic calcification of the major vessels at the base of the brain. Skull: Negative Sinuses/Orbits: Clear/normal Other: None ASPECTS ([place] Stroke Program Early CT Score) - Ganglionic level infarction (caudate, lentiform nuclei, internal capsule, insula, M1-M3 cortex):  7 - Supraganglionic infarction (M4-M6 cortex): 3 Total score (0-10 with 10 being normal): 10 IMPRESSION: 1. No acute finding by CT. Generalized atrophy, frontal and temporal predominant. Old lacunar infarction right thalamus. 2. ASPECTS is 10. 3. These results were called by telephone at the time of interpretation on 08/05/2018 at 5:01 pm to Dr. Isla Pence , who verbally acknowledged these results. Electronically Signed   By: Nelson Chimes M.D.   On: 08/05/2018 17:02    EKG: Independently reviewed.  Sinus arrhythmia (heart rate 89).  Assessment/Plan Principal Problem:   Acute CVA (cerebrovascular accident) [facility]) Active Problems:   Hypertension   Anemia   Elevated alkaline phosphatase level   Hypothyroidism   Acute CVA She is presenting from her nursing home for altered mental status and facial droop.  CT head negative for acute finding.  Seen by neurology and her presentation was thought to be most likely due to toxic/metabolic encephalopathy or infection. Her presentation of altered mental status was thought likely not to be secondary to a stroke due to lack of lateralizing abnormalities.  Neurology recommended evaluating for toxic/metabolic issues that could cause encephalopathy.  Also recommended obtaining MRI of the brain to evaluate for possible stroke.  If positive,  continue with stroke work-up.  -Ammonia level normal.  AST and ALT normal. -MRI brain done subsequently revealed a small acute or early subacute infarct in the medial right thalamus; no hemorrhage or mass-effect. -Echo -PT/OT/SLP -N.p.o. until her mental status improves -Supplemental oxygen if needed -A1c, lipid panel -Continue aspirin -Permissive hypertension -Neurology following; appreciate recs  Elevated alk phos AST and ALT normal.  Alk phos 131 (elevated 2 weeks ago as well).  T bili normal. -Repeat CMP in a.m. -If alk phos continues to be elevated, consider checking GGT to distinguish between intrahepatic versus bone pathology  Chronic anemia -Stable. Hemoglobin 10.7 (at baseline).   Potassium 5.6 on i-STAT chemistry but 4.0 on CMP.  -Repeat CMP in a.m.  HTN Blood pressure as high as 202/117 in the ED. Patient received IV labetalol in the ED.  Blood pressure down to 104/66.  Currently 154/94. -Hold antihypertensives to allow permissive hypertension in the setting of stroke  Hypothyroidism -Continue home levothyroxine  DVT prophylaxis: Lovenox Code Status: DNR Family Communication: No family present at bedside. Disposition Plan: Anticipate discharge to nursing home in 1 to 2 days. Consults called: Neurology-Dr. Cheral Marker Admission status: Inpatient It is my clinical opinion that admission to INPATIENT is reasonable and necessary in this 84 y.o. female . presenting with symptoms of AMS, facial droop, concerning for acute CVA . in the context of PMH including: Prior CVA . with pertinent positives on physical exam including: AMS . and pertinent positives on radiographic and laboratory data including: MRI with evidence of acute/ early subacute CVA . Workup and treatment include stroke work-up as mentioned above  Given the aforementioned, the predictability of an adverse outcome is felt to be significant. I expect that the patient will require at least 2 midnights in the  hospital to treat this condition.    Shela Leff MD [facility] Pager [phone]  If 7PM-7AM, please contact night-coverage [email or web address] Password [email or web address]  08/06/2018, 4:42 AM

## 2018-08-06 ENCOUNTER — Inpatient Hospital Stay (HOSPITAL_COMMUNITY): Payer: Medicare Other

## 2018-08-06 DIAGNOSIS — I639 Cerebral infarction, unspecified: Secondary | ICD-10-CM

## 2018-08-06 DIAGNOSIS — I10 Essential (primary) hypertension: Secondary | ICD-10-CM

## 2018-08-06 DIAGNOSIS — D649 Anemia, unspecified: Secondary | ICD-10-CM

## 2018-08-06 DIAGNOSIS — E039 Hypothyroidism, unspecified: Secondary | ICD-10-CM

## 2018-08-06 DIAGNOSIS — R748 Abnormal levels of other serum enzymes: Secondary | ICD-10-CM

## 2018-08-06 DIAGNOSIS — I5030 Unspecified diastolic (congestive) heart failure: Secondary | ICD-10-CM

## 2018-08-06 LAB — LIPID PANEL
Cholesterol: 118 mg/dL (ref 0–200)
HDL: 41 mg/dL (ref >40)
LDL CALC: 63 mg/dL (ref 0–99)
Total CHOL/HDL Ratio: 2.9 RATIO
Triglycerides: 71 mg/dL (ref <150)
VLDL: 14 mg/dL (ref 0–40)

## 2018-08-06 LAB — COMPREHENSIVE METABOLIC PANEL
ALBUMIN: 3.3 g/dL — AB (ref 3.5–5.0)
ALT: 16 U/L (ref 0–44)
AST: 17 U/L (ref 15–41)
Alkaline Phosphatase: 105 U/L (ref 38–126)
Anion gap: 8 (ref 5–15)
BUN: 17 mg/dL (ref 8–23)
CHLORIDE: 110 mmol/L (ref 98–111)
CO2: 23 mmol/L (ref 22–32)
CREATININE: 0.99 mg/dL (ref 0.44–1.00)
Calcium: 8.7 mg/dL — ABNORMAL LOW (ref 8.9–10.3)
GFR calc Af Amer: 57 mL/min — ABNORMAL LOW (ref >60)
GFR, EST NON AFRICAN AMERICAN: 49 mL/min — AB (ref >60)
GLUCOSE: 90 mg/dL (ref 70–99)
POTASSIUM: 4.1 mmol/L (ref 3.5–5.1)
Sodium: 141 mmol/L (ref 135–145)
Total Bilirubin: 0.4 mg/dL (ref 0.3–1.2)
Total Protein: 5.5 g/dL — ABNORMAL LOW (ref 6.5–8.1)

## 2018-08-06 LAB — ECHOCARDIOGRAM COMPLETE
HEIGHTINCHES: 64 in
Weight: 2603.19 oz

## 2018-08-06 LAB — HEMOGLOBIN A1C
HEMOGLOBIN A1C: 5.5 % (ref 4.8–5.6)
Mean Plasma Glucose: 111.15 mg/dL

## 2018-08-06 NOTE — Progress Notes (Signed)
Discharge to: [facility] Anticipated discharge date: 08/06/18 Family notified: Yes, son [name], by phone Transportation by: [facility]  Report #: [phone]  [place] signing off.  Laveda Abbe LCSW [phone]

## 2018-08-06 NOTE — Progress Notes (Addendum)
STROKE TEAM PROGRESS NOTE  HPI:( Dr Cheral Marker )  Cathy Johnson is a 84 y.o. female with history of dementia, PFO and hypertension.  Patient also has a history of dementia.  EMS was called to nursing home secondary to the fact that patient was found slumped over in her bathroom at 1540.  The patient had a right facial droop and was leaned over to the right and unresponsive other than to sternal rub.  Patient was brought to [facility] noted that the pupils were unequal however on arrival they were only approximately 1 mm difference.  Patient would localize to sternal rub, was able to hold both arms up, and was able to withdraw both legs while on the bridge.  She still remained uncommunicative.  Patient went to CT which showed no acute intracranial bleed or stroke.  LKW: 4827 on 08/05/18 tpa given?: no,  Premorbid modified Rankin scale (mRS): 0 NIH stroke scale of 11  INTERVAL HISTORY Her RN is at the bedside.  No family present. Pt confused, no significant communication.   Vitals:   08/06/18 0745 08/06/18 1100 08/06/18 1111 08/06/18 1113  BP: (!) 170/78   (!) 167/69  Pulse:  87    Resp:  15    Temp: 98.1 F (36.7 C)  98.6 F (37 C)   TempSrc: Oral  Oral   SpO2:  97%    Weight:      Height:        CBC:  Recent Labs  Lab 08/05/18 1648 08/05/18 1700  WBC 7.0  --   NEUTROABS 4.3  --   HGB 10.7* 11.6*  HCT 35.2* 34.0*  MCV 94.9  --   PLT 235  --     Basic Metabolic Panel:  Recent Labs  Lab 08/05/18 1648 08/05/18 1700 08/06/18 0359  NA 140 139 141  K 4.0 5.6* 4.1  CL 106 106 110  CO2 23  --  23  GLUCOSE 102* 101* 90  BUN 20 32* 17  CREATININE 1.07* 1.10* 0.99  CALCIUM 9.2  --  8.7*   Lipid Panel:     Component Value Date/Time   CHOL 118 08/06/2018 0359   TRIG 71 08/06/2018 0359   HDL 41 08/06/2018 0359   CHOLHDL 2.9 08/06/2018 0359   VLDL 14 08/06/2018 0359   LDLCALC 63 08/06/2018 0359   HgbA1c:  Lab Results  Component Value Date    HGBA1C 5.5 08/06/2018   Urine Drug Screen: No results found for: LABOPIA, COCAINSCRNUR, LABBENZ, AMPHETMU, THCU, LABBARB  Alcohol Level     Component Value Date/Time   ETH <5 03/06/2017 1127    IMAGING Ct Angio Head W Or Wo Contrast  Result Date: 08/05/2018 CLINICAL DATA:  Code stroke. Right-sided facial droop. Negative acute head CT. EXAM: CT ANGIOGRAPHY HEAD AND NECK TECHNIQUE: Multidetector CT imaging of the head and neck was performed using the standard protocol during bolus administration of intravenous contrast. Multiplanar CT image reconstructions and MIPs were obtained to evaluate the vascular anatomy. Carotid stenosis measurements (when applicable) are obtained utilizing NASCET criteria, using the distal internal carotid diameter as the denominator. CONTRAST:  25m ISOVUE-370 IOPAMIDOL (ISOVUE-370) INJECTION 76% COMPARISON:  Head CT earlier same day and multiple previous. FINDINGS: CTA NECK FINDINGS Aortic arch: Aortic atherosclerosis. No aneurysm. Branching pattern is normal. Right carotid system: Common carotid artery widely patent to the bifurcation. Mild atherosclerotic plaque at the carotid bifurcation but no stenosis or irregularity. Cervical ICA is tortuous.  Mild fibromuscular change beneath the skull base. Left carotid system: Common carotid artery widely patent to the bifurcation. Minimal calcified plaque at the carotid bifurcation but no stenosis or irregularity. Cervical ICA is tortuous but unremarkable otherwise. Vertebral arteries: No subclavian disease of significance. Both vertebral artery origins appear sufficiently patent. Left vertebral artery is dominant. Both vertebral arteries are patent through the cervical region to the foramen magnum. Skeleton: Chronic degenerative spondylosis. Other neck: Thyroid goiter.  No other soft tissue finding. Upper chest: Negative Review of the MIP images confirms the above findings CTA HEAD FINDINGS Anterior circulation: Both internal carotid  arteries are patent through the skull base and siphon regions. The anterior and middle cerebral vessels are patent without proximal stenosis, aneurysm or vascular malformation. No missing branch vessel identified. Posterior circulation: Both vertebral arteries are patent to the basilar. No basilar stenosis. Posterior circulation branch vessels are patent. Posterior cerebral arteries receive most of the supply from the anterior circulation. Venous sinuses: Patent and normal. Anatomic variants: None significant. Delayed phase: No abnormal enhancement. Review of the MIP images confirms the above findings IMPRESSION: No acute brain finding identified. No likely significant vascular disease. Minimal atherosclerotic change at both carotid bifurcations without stenosis or irregularity. No intracranial branch vessel stenosis or occlusion identified. Minimal fibromuscular change of the cervical ICA on the right beneath the skull base, probably not relevant. These results were communicated to Dr. Cheral Marker At 5:33 pmon 10/17/2019by text page via the [facility] messaging system. Electronically Signed   By: Nelson Chimes M.D.   On: 08/05/2018 17:35   Ct Angio Neck W Or Wo Contrast  Result Date: 08/05/2018 CLINICAL DATA:  Code stroke. Right-sided facial droop. Negative acute head CT. EXAM: CT ANGIOGRAPHY HEAD AND NECK TECHNIQUE: Multidetector CT imaging of the head and neck was performed using the standard protocol during bolus administration of intravenous contrast. Multiplanar CT image reconstructions and MIPs were obtained to evaluate the vascular anatomy. Carotid stenosis measurements (when applicable) are obtained utilizing NASCET criteria, using the distal internal carotid diameter as the denominator. CONTRAST:  73m ISOVUE-370 IOPAMIDOL (ISOVUE-370) INJECTION 76% COMPARISON:  Head CT earlier same day and multiple previous. FINDINGS: CTA NECK FINDINGS Aortic arch: Aortic atherosclerosis. No aneurysm. Branching pattern is  normal. Right carotid system: Common carotid artery widely patent to the bifurcation. Mild atherosclerotic plaque at the carotid bifurcation but no stenosis or irregularity. Cervical ICA is tortuous. Mild fibromuscular change beneath the skull base. Left carotid system: Common carotid artery widely patent to the bifurcation. Minimal calcified plaque at the carotid bifurcation but no stenosis or irregularity. Cervical ICA is tortuous but unremarkable otherwise. Vertebral arteries: No subclavian disease of significance. Both vertebral artery origins appear sufficiently patent. Left vertebral artery is dominant. Both vertebral arteries are patent through the cervical region to the foramen magnum. Skeleton: Chronic degenerative spondylosis. Other neck: Thyroid goiter.  No other soft tissue finding. Upper chest: Negative Review of the MIP images confirms the above findings CTA HEAD FINDINGS Anterior circulation: Both internal carotid arteries are patent through the skull base and siphon regions. The anterior and middle cerebral vessels are patent without proximal stenosis, aneurysm or vascular malformation. No missing branch vessel identified. Posterior circulation: Both vertebral arteries are patent to the basilar. No basilar stenosis. Posterior circulation branch vessels are patent. Posterior cerebral arteries receive most of the supply from the anterior circulation. Venous sinuses: Patent and normal. Anatomic variants: None significant. Delayed phase: No abnormal enhancement. Review of the MIP images confirms the above findings IMPRESSION: No  acute brain finding identified. No likely significant vascular disease. Minimal atherosclerotic change at both carotid bifurcations without stenosis or irregularity. No intracranial branch vessel stenosis or occlusion identified. Minimal fibromuscular change of the cervical ICA on the right beneath the skull base, probably not relevant. These results were communicated to Dr.  Cheral Marker At 5:33 pmon 10/17/2019by text page via the [facility] messaging system. Electronically Signed   By: Nelson Chimes M.D.   On: 08/05/2018 17:35   Mr Jeri Cos And Wo Contrast  Result Date: 08/05/2018 CLINICAL DATA:  Right facial droop and altered mental status. EXAM: MRI HEAD WITHOUT AND WITH CONTRAST TECHNIQUE: Multiplanar, multiecho pulse sequences of the brain and surrounding structures were obtained without and with intravenous contrast. CONTRAST:  7 mL Gadavist COMPARISON:  CTA head and neck 08/05/2018 FINDINGS: BRAIN: Small focus of abnormal diffusion restriction within the medial left thalamus. No other diffusion abnormality. The midline structures are normal. No midline shift or other mass effect. Multiple old cerebellar infarcts. Old infarcts of the left basal ganglia and right thalamus. Multifocal white matter hyperintensity, most commonly due to chronic ischemic microangiopathy. Generalized atrophy without lobar predilection. Scattered chronic microhemmorhages in a peripheral predominant distribution. No VASCULAR: Major intracranial arterial and venous sinus flow voids are normal. SKULL AND UPPER CERVICAL SPINE: Calvarial bone marrow signal is normal. There is no skull base mass. Visualized upper cervical spine and soft tissues are normal. SINUSES/ORBITS: No fluid levels or advanced mucosal thickening. No mastoid or middle ear effusion. The orbits are normal. IMPRESSION: 1. Small acute or early subacute infarct of the medial right thalamus. No hemorrhage or mass effect. 2. Chronic ischemic microangiopathy and generalized atrophy. 3. 3-5 scattered chronic microhemorrhages in a peripheral distribution, nonspecific. Electronically Signed   By: Ulyses Jarred M.D.   On: 08/05/2018 21:45   Dg Chest Portable 1 View  Result Date: 08/05/2018 CLINICAL DATA:  Fatigue.  Mental status changes. EXAM: PORTABLE CHEST 1 VIEW COMPARISON:  07/20/2018 FINDINGS: Borderline cardiomegaly. Chronic aortic  atherosclerosis. Lungs appear clear. The vascularity is normal. No effusions. No visible bone abnormality. IMPRESSION: No active disease. Electronically Signed   By: Nelson Chimes M.D.   On: 08/05/2018 17:59   Ct Head Code Stroke Wo Contrast  Result Date: 08/05/2018 CLINICAL DATA:  Code stroke.  Slurred speech.  Right facial droop. EXAM: CT HEAD WITHOUT CONTRAST TECHNIQUE: Contiguous axial images were obtained from the base of the skull through the vertex without intravenous contrast. COMPARISON:  07/20/2018.  Multiple previous. FINDINGS: Brain: Generalized atrophy with frontal and temporal predominance. No sign of acute infarction, mass lesion, hemorrhage, hydrocephalus or extra-axial collection. Old lacunar infarction right thalamus. Vascular: There is atherosclerotic calcification of the major vessels at the base of the brain. Skull: Negative Sinuses/Orbits: Clear/normal Other: None ASPECTS ([place] Stroke Program Early CT Score) - Ganglionic level infarction (caudate, lentiform nuclei, internal capsule, insula, M1-M3 cortex): 7 - Supraganglionic infarction (M4-M6 cortex): 3 Total score (0-10 with 10 being normal): 10 IMPRESSION: 1. No acute finding by CT. Generalized atrophy, frontal and temporal predominant. Old lacunar infarction right thalamus. 2. ASPECTS is 10. 3. These results were called by telephone at the time of interpretation on 08/05/2018 at 5:01 pm to Dr. Isla Pence , who verbally acknowledged these results. Electronically Signed   By: Nelson Chimes M.D.   On: 08/05/2018 17:02   2D Echocardiogram  - Left ventricle: The cavity size was normal. Wall thickness was increased in a pattern of mild LVH. Systolic function was normal. The estimated  ejection fraction was in the range of 55% to 60%. Wall motion was normal; there were no regional wall motion abnormalities. Doppler parameters are consistent with abnormal left ventricular relaxation (grade 1 diastolic dysfunction). The E/e&' ratio is  between 8-15, suggesting indeterminate LV filling pressure. - Aortic valve: Sclerosis without stenosis. There was trivial regurgitation. - Mitral valve: Mildly thickened leaflets . There was trivial regurgitation. - Left atrium: The atrium was normal in size. - Inferior vena cava: The vessel was normal in size. The respirophasic diameter changes were in the normal range (>= 50%), consistent with normal central venous pressure. Impressions:  LVEF 55-60%, mild LVH, normal wall motion, grade 1 DD, indeterminate LV filling pressure, aortic sclerosis with trivial AI, trivial MR, normal LA size, normal IVC.   PHYSICAL EXAM  pleasant elderly Caucasian lady who is confused and disoriented. Not in distress . Afebrile. Head is nontraumatic. Neck is supple without bruit.    Cardiac exam no murmur or gallop. Lungs are clear to auscultation. Distal pulses are well felt.  Neurological Exam :  Awake alert disoriented to time place and person. Speech is mostly nonsensical and difficult to understand. Follows   simple midline and commands only. Blinks to threat more on the left than the right. Pupils small sluggishly reactive. Fundi not visualized. Does not follow gaze. Mild right lower facial asymmetry. Able to move all 4 extremities against gravity with mild weakness in bilateral proximal lower extremities but symmetric. Mild action tremor of both upper extremities with some asterixis bilaterally. Deep tendon pulses are symmetric. Plantars downgoing. Sensation and coordination cannot be reliably tested.  ASSESSMENT/PLAN Cathy Johnson is a 84 y.o. female with history of dementia, stroke 2012, PFO, dementia and HTN found slumped over in the nursing home bathroom R facial droop, altered mental status.   Stroke:   Medial L thalamic infarct secondary to small vessel disease source  Code Stroke CT head No acute stroke. Old R thalamic lacune. Small vessel disease. Atrophy. ASPECTS 10.     CTA head & neck no  stroke. Minimal atherosclerosis. Min R ICA fibromuscular changes  MRI  Small R thalamic infarct. Small vessel disease. Atrophy. 3-5 scattered chronic microhemorrhages  2D Echo  EF 55-60%. No source of embolus   LDL 63  HgbA1c 5.5  Lovenox 40 mg sq daily for VTE prophylaxis Diet Order            Diet NPO time specified  Diet effective now              aspirin 81 mg daily and clopidogrel 75 mg daily prior to admission, now on aspirin 300 mg suppository daily. Once able to swallow, resume aspirn 81 and plavix 75. Continue at d/c  Therapy recommendations:  SNF (from SNF)  Disposition:  SNF  Hypertension  Stable . Permissive hypertension (OK if < 220/120) but gradually normalize in 5-7 days . Long-term BP goal normotensive  Hyperlipidemia  Home meds:  lipitor 20,   LDL 63, goal < 70  Resume statin once able to swallow  Continue statin at discharge  Other Stroke Risk Factors  Advanced age  Hx stroke/TIA  01/2011 - L posterior frontal gyrus and left parietal gyrus infarcts, embolic without source found. PFO. Treated with aspirin 325 mg daily.  Other Active Problems  Dementia  Elevated alk phos  Chronic anemia  kyperkalemia  Hypothyroidism   Hospital day # 1  Burnetta Sabin, MSN, APRN, [employer], AGPCNP-BC Advanced Practice Stroke Nurse [facility]  See Amion for Schedule & Pager information 08/06/2018 2:29 PM  I have personally examined this patient, reviewed notes, independently viewed imaging studies, participated in medical decision making and plan of care.ROS completed by me personally and pertinent positives fully documented  I have made any additions or clarifications directly to the above note. Agree with note above. She has history of baseline dementia and presented with altered mental status and MRI scan shows a small medial thalamic infarct likely from small vessel disease. Her neurological exams show significant Dementia but not much  new lateralizing findings. Recommend continue aspirin and ongoing stroke workup. Do not recommend aggressive workup given her significant underlying dementia and poor general medical status. No family available at the bedside for discussion. Discuss with Dr. Ree Kida. Greater than 50% time during this 35 minute visit was spent in counseling and coordination of care about her stroke, dementia and discussion with care team and answering questions  Antony Contras, MD Medical Director [place] Pager: [phone] 08/06/2018 4:14 PM  To contact Stroke Continuity provider, please refer to [email or web address] After hours, contact General Neurology

## 2018-08-06 NOTE — Progress Notes (Signed)
Patient arrived on the floor sleeping. Alert, responded to voice. Skin is intact, unable to complete admission assessment, NPO, tele, IV fluid and med given. Will continue to monitor.

## 2018-08-06 NOTE — Plan of Care (Signed)
  Problem: Education: Goal: Knowledge of General Education information will improve Description Including pain rating scale, medication(s)/side effects and non-pharmacologic comfort measures 08/06/2018 1620 by Evalee Jefferson, RN Outcome: Adequate for Discharge 08/06/2018 1135 by Evalee Jefferson, RN Outcome: Progressing   Problem: Health Behavior/Discharge Planning: Goal: Ability to manage health-related needs will improve 08/06/2018 1620 by Evalee Jefferson, RN Outcome: Adequate for Discharge 08/06/2018 1135 by Evalee Jefferson, RN Outcome: Progressing   Problem: Clinical Measurements: Goal: Ability to maintain clinical measurements within normal limits will improve 08/06/2018 1620 by Evalee Jefferson, RN Outcome: Adequate for Discharge 08/06/2018 1135 by Evalee Jefferson, RN Outcome: Progressing Goal: Will remain free from infection 08/06/2018 1620 by Evalee Jefferson, RN Outcome: Adequate for Discharge 08/06/2018 1135 by Evalee Jefferson, RN Outcome: Progressing Goal: Diagnostic test results will improve 08/06/2018 1620 by Evalee Jefferson, RN Outcome: Adequate for Discharge 08/06/2018 1135 by Evalee Jefferson, RN Outcome: Progressing Goal: Respiratory complications will improve 08/06/2018 1620 by Evalee Jefferson, RN Outcome: Adequate for Discharge 08/06/2018 1135 by Evalee Jefferson, RN Outcome: Progressing Goal: Cardiovascular complication will be avoided 08/06/2018 1620 by Evalee Jefferson, RN Outcome: Adequate for Discharge 08/06/2018 1135 by Evalee Jefferson, RN Outcome: Progressing   Problem: Activity: Goal: Risk for activity intolerance will decrease 08/06/2018 1620 by Evalee Jefferson, RN Outcome: Adequate for Discharge 08/06/2018 1135 by Evalee Jefferson, RN Outcome: Progressing   Problem: Nutrition: Goal: Adequate nutrition will be maintained 08/06/2018  1620 by Evalee Jefferson, RN Outcome: Adequate for Discharge 08/06/2018 1135 by Evalee Jefferson, RN Outcome: Progressing   Problem: Coping: Goal: Level of anxiety will decrease 08/06/2018 1620 by Evalee Jefferson, RN Outcome: Adequate for Discharge 08/06/2018 1135 by Evalee Jefferson, RN Outcome: Progressing   Problem: Elimination: Goal: Will not experience complications related to bowel motility 08/06/2018 1620 by Evalee Jefferson, RN Outcome: Adequate for Discharge 08/06/2018 1135 by Evalee Jefferson, RN Outcome: Progressing Goal: Will not experience complications related to urinary retention 08/06/2018 1620 by Evalee Jefferson, RN Outcome: Adequate for Discharge 08/06/2018 1135 by Evalee Jefferson, RN Outcome: Progressing   Problem: Pain Managment: Goal: General experience of comfort will improve 08/06/2018 1620 by Evalee Jefferson, RN Outcome: Adequate for Discharge 08/06/2018 1135 by Evalee Jefferson, RN Outcome: Progressing   Problem: Safety: Goal: Ability to remain free from injury will improve 08/06/2018 1620 by Evalee Jefferson, RN Outcome: Adequate for Discharge 08/06/2018 1135 by Evalee Jefferson, RN Outcome: Progressing   Problem: Skin Integrity: Goal: Risk for impaired skin integrity will decrease 08/06/2018 1620 by Evalee Jefferson, RN Outcome: Adequate for Discharge 08/06/2018 1135 by Evalee Jefferson, RN Outcome: Progressing

## 2018-08-06 NOTE — Plan of Care (Signed)

## 2018-08-06 NOTE — Evaluation (Signed)
Occupational Therapy Evaluation Patient Details Name: Cathy Johnson MRN: [id number] DOB: [birth year] Today's Date: 08/06/2018    History of Present Illness Pt is an 84 y/o female admitted secondary to AMS. MRI revealed small acute/early subacute infarct of medial R thalamus. PMH including but not limited to dementia and HTN.   Clinical Impression   This 84 yo female admitted with above presents to acute OT at a total A level for all basic ADLS. Pt only followed one command during session. Pt with increased eye opening in sitting but rather lethargic throughout. Pt from SNF and plans is to go back to SNF so recommend continued OT at that facility as they deem appropriate based off how pt was doing there prior to this illness. Acute OT will sign off.     Follow Up Recommendations  SNF;Supervision/Assistance - 24 hour          Precautions / Restrictions Precautions Precautions: Fall Restrictions Weight Bearing Restrictions: No      Mobility Bed Mobility Overal bed mobility: Needs Assistance Bed Mobility: Rolling;Supine to Sit;Sit to Supine Rolling: Total assist;+2 for physical assistance   Supine to sit: Total assist;+2 for physical assistance Sit to supine: Total assist;+2 for physical assistance   General bed mobility comments: pt rolled bilaterally for pericare at beginning of session, once rolled to either side with total A x2 pt able to hold railing with UEs. Total A x2 for supine<>sit with use of bed pads  Transfers                 General transfer comment: deferred    Balance Overall balance assessment: Needs assistance Sitting-balance support: Feet unsupported;No upper extremity supported Sitting balance-Leahy Scale: Fair Sitting balance - Comments: after positioned at EOB, pt able to sit statically with min guard for safety                                   ADL either performed or assessed with clinical judgement   ADL                                          General ADL Comments: total A at present. Does not attempt to wash face when washcloth placed in hand and when attempted to A hand over hand there is resistance     Vision Baseline Vision/History: (unknown) Additional Comments: unable to test due to limited eye opening (more so in sitting than supine)            Pertinent Vitals/Pain Pain Assessment: Faces Faces Pain Scale: No hurt        Extremity/Trunk Assessment Upper Extremity Assessment Upper Extremity Assessment: Generalized weakness(increased tone at times bilaterally)   Lower Extremity Assessment Lower Extremity Assessment: Generalized weakness;Difficult to assess due to impaired cognition       Communication Communication Communication: Expressive difficulties   Cognition Arousal/Alertness: Lethargic Behavior During Therapy: Flat affect Overall Cognitive Status: History of cognitive impairments - at baseline Area of Impairment: Following commands;Problem solving                       Following Commands: Follows one step commands inconsistently     Problem Solving: Slow processing;Decreased initiation;Difficulty sequencing;Requires verbal cues;Requires tactile cues General Comments: Pt did follow command to shake hand on first trial  but not on second.               Home Living Family/patient expects to be discharged to:: Skilled nursing facility                                 Additional Comments: pt unable to provide any information, no family/caregivers present      Prior Functioning/Environment Level of Independence: Needs assistance        Comments: unsure - pt unable to provide any information, no family/caregivers present        OT Problem List: Decreased strength;Decreased range of motion;Decreased activity tolerance;Impaired balance (sitting and/or standing);Decreased safety awareness;Decreased coordination;Decreased  cognition;Impaired vision/perception;Impaired tone      OT Treatment/Interventions:      OT Goals(Current goals can be found in the care plan section) Acute Rehab OT Goals Patient Stated Goal: unable to state  OT Frequency:             Co-evaluation PT/OT/SLP Co-Evaluation/Treatment: Yes Reason for Co-Treatment: For patient/therapist safety;To address functional/ADL transfers PT goals addressed during session: Mobility/safety with mobility;Balance;Strengthening/ROM OT goals addressed during session: ADL's and self-care;Strengthening/ROM      AM-PAC PT "6 Clicks" Daily Activity     Outcome Measure Help from another person eating meals?: Total Help from another person taking care of personal grooming?: Total Help from another person toileting, which includes using toliet, bedpan, or urinal?: Total Help from another person bathing (including washing, rinsing, drying)?: Total Help from another person to put on and taking off regular upper body clothing?: Total Help from another person to put on and taking off regular lower body clothing?: Total 6 Click Score: 6   End of Session Nurse Communication: Mobility status  Activity Tolerance: Patient limited by lethargy Patient left: in bed;with call bell/phone within reach;with bed alarm set  OT Visit Diagnosis: Other abnormalities of gait and mobility (R26.89);Muscle weakness (generalized) (M62.81);Other symptoms and signs involving cognitive function;Cognitive communication deficit (R41.841)                Time: 3419-3790 OT Time Calculation (min): 30 min Charges:  OT General Charges $OT Visit: 1 Visit OT Evaluation $OT Eval Moderate Complexity: 1 Mod  [address], OTR/L Acute [facility] [phone] Office [phone]

## 2018-08-06 NOTE — Evaluation (Signed)
Physical Therapy Evaluation Patient Details Name: Cathy Johnson MRN: [id number] DOB: 08-May-1928 Today's Date: 08/06/2018   History of Present Illness  Pt is an 84 y/o female admitted secondary to AMS. MRI revealed small acute/early subacute infarct of medial R thalamus. PMH including but not limited to dementia and HTN.    Clinical Impression  Pt presented supine in bed with HOB elevated, initially asleep and difficult to arouse. Pt found with wet bed linens and padding. Total A x2 required for bed mobility and pericare. Pt more awake with movement and sitting upright at EOB. VSS throughout. No family/caregivers present and pt unable to provide any information regarding PLOF or home environment. Pt would continue to benefit from skilled physical therapy services at this time while admitted and after d/c to address the below listed limitations in order to improve overall safety and independence with functional mobility.      Follow Up Recommendations SNF    Equipment Recommendations  None recommended by PT    Recommendations for Other Services       Precautions / Restrictions Precautions Precautions: Fall Restrictions Weight Bearing Restrictions: No      Mobility  Bed Mobility Overal bed mobility: Needs Assistance Bed Mobility: Rolling;Supine to Sit;Sit to Supine Rolling: Total assist;+2 for physical assistance   Supine to sit: Total assist;+2 for physical assistance Sit to supine: Total assist;+2 for physical assistance   General bed mobility comments: pt rolled bilaterally for pericare at beginning of session, once rolled to either side with total A x2 pt able to hold railing with UEs. Total A x2 for supine<>sit with use of bed pads  Transfers                 General transfer comment: deferred  Ambulation/Gait                Stairs            Wheelchair Mobility    Modified Rankin (Stroke Patients Only) Modified Rankin (Stroke Patients  Only) Pre-Morbid Rankin Score: Severe disability Modified Rankin: Severe disability     Balance Overall balance assessment: Needs assistance Sitting-balance support: Feet unsupported;No upper extremity supported Sitting balance-Leahy Scale: Fair Sitting balance - Comments: after positioned at EOB, pt able to sit statically with min guard for safety                                     Pertinent Vitals/Pain Pain Assessment: Faces Faces Pain Scale: No hurt    Home Living Family/patient expects to be discharged to:: Skilled nursing facility                 Additional Comments: pt unable to provide any information, no family/caregivers present    Prior Function Level of Independence: Needs assistance         Comments: unsure - pt unable to provide any information, no family/caregivers present     Hand Dominance        Extremity/Trunk Assessment   Upper Extremity Assessment Upper Extremity Assessment: Defer to OT evaluation    Lower Extremity Assessment Lower Extremity Assessment: Generalized weakness;Difficult to assess due to impaired cognition       Communication   Communication: Expressive difficulties  Cognition Arousal/Alertness: Lethargic Behavior During Therapy: Flat affect Overall Cognitive Status: History of cognitive impairments - at baseline Area of Impairment: Following commands;Problem solving  Following Commands: Follows one step commands inconsistently     Problem Solving: Slow processing;Decreased initiation;Difficulty sequencing;Requires verbal cues;Requires tactile cues        General Comments      Exercises     Assessment/Plan    PT Assessment Patient needs continued PT services  PT Problem List Decreased strength;Decreased range of motion;Decreased activity tolerance;Decreased balance;Decreased mobility;Decreased coordination;Decreased cognition;Decreased knowledge of use of  DME;Decreased safety awareness;Decreased knowledge of precautions       PT Treatment Interventions DME instruction;Therapeutic activities;Gait training;Functional mobility training;Therapeutic exercise;Cognitive remediation;Balance training;Neuromuscular re-education;Patient/family education    PT Goals (Current goals can be found in the Care Plan section)  Acute Rehab PT Goals Patient Stated Goal: unable to state PT Goal Formulation: Patient unable to participate in goal setting Time For Goal Achievement: 08/20/18 Potential to Achieve Goals: Fair    Frequency Min 3X/week   Barriers to discharge        Co-evaluation PT/OT/SLP Co-Evaluation/Treatment: Yes Reason for Co-Treatment: For patient/therapist safety;To address functional/ADL transfers PT goals addressed during session: Mobility/safety with mobility;Balance;Strengthening/ROM         AM-PAC PT "6 Clicks" Daily Activity  Outcome Measure Difficulty turning over in bed (including adjusting bedclothes, sheets and blankets)?: Unable Difficulty moving from lying on back to sitting on the side of the bed? : Unable Difficulty sitting down on and standing up from a chair with arms (e.g., wheelchair, bedside commode, etc,.)?: Unable Help needed moving to and from a bed to chair (including a wheelchair)?: Total Help needed walking in hospital room?: Total Help needed climbing 3-5 steps with a railing? : Total 6 Click Score: 6    End of Session   Activity Tolerance: Patient tolerated treatment well Patient left: in bed;with call bell/phone within reach;with bed alarm set Nurse Communication: Mobility status;Need for lift equipment PT Visit Diagnosis: Other abnormalities of gait and mobility (R26.89);Other symptoms and signs involving the nervous system (R29.898)    Time: 4166-0630 PT Time Calculation (min) (ACUTE ONLY): 24 min   Charges:   PT Evaluation $PT Eval Moderate Complexity: 1 Mod          Sherie Don, PT,  DPT  Acute Rehabilitation Services Pager [phone] Office [place] 08/06/2018, 9:46 AM

## 2018-08-06 NOTE — Progress Notes (Signed)
  Echocardiogram 2D Echocardiogram has been performed.  Cathy Johnson 08/06/2018, 1:44 PM

## 2018-08-06 NOTE — NC FL2 (Signed)
[place] LEVEL OF CARE SCREENING TOOL     IDENTIFICATION  Patient Name: Cathy Johnson Birthdate: 1928-07-14 Sex: female Admission Date (Current Location): 08/05/2018  [facility] and [place] Number:  Engineering geologist and Address:  [facility], [place] [address], [place], [place] [address]      Provider Number: [id number]  Attending Physician Name and Address:  Cristal Ford, DO  Relative Name and Phone Number:       Current Level of Care: Hospital Recommended Level of Care: Memory Care Prior Approval Number:    Date Approved/Denied:   PASRR Number:    Discharge Plan: Other (Comment)(Memory care)    Current Diagnoses: Patient Active Problem List   Diagnosis Date Noted  . Elevated alkaline phosphatase level 08/06/2018  . Hypothyroidism 08/06/2018  . AMS (altered mental status) 08/05/2018  . Acute CVA (cerebrovascular accident) ([place]) 08/05/2018  . Altered mental status 07/21/2018  . Facial droop 03/06/2017  . AKI (acute kidney injury) ([place]) 01/06/2016  . Syncope 01/06/2016  . Dementia ([place]) 01/06/2016  . Hypertension 01/06/2016  . Anemia 01/06/2016  . Osteoarthritis of right knee 05/02/2014  . Knee osteoarthritis 05/02/2014    Orientation RESPIRATION BLADDER Height & Weight     Self  Normal Incontinent Weight: 162 lb 11.2 oz (73.8 kg) Height:  5\' 4"  (162.6 cm)  BEHAVIORAL SYMPTOMS/MOOD NEUROLOGICAL BOWEL NUTRITION STATUS      Incontinent Diet(see DC summary)  AMBULATORY STATUS COMMUNICATION OF NEEDS Skin   Limited Assist Verbally Normal                       Personal Care Assistance Level of Assistance  Bathing, Feeding, Dressing Bathing Assistance: Limited assistance Feeding assistance: Independent Dressing Assistance: Limited assistance     Functional Limitations Info  Sight, Hearing, Speech Sight Info: Adequate Hearing Info: Adequate Speech Info: Adequate    SPECIAL CARE FACTORS FREQUENCY                       Contractures Contractures Info: Not present    Additional Factors Info  Code Status, Allergies Code Status Info: DNR Allergies Info: Boniva Ibandronic Acid, Crestor Rosuvastatin Calcium, Exelon Rivastigmine Tartrate, Fosamax Alendronate Sodium, Galantamine, Lipitor Atorvastatin, Morphine And Related, Other, Vagifem Estradiol           Current Medications (08/06/2018):  This is the current hospital active medication list Current Facility-Administered Medications  Medication Dose Route Frequency Provider Last Rate Last Dose  .  stroke: mapping our early stages of recovery book   Does not apply Once Shela Leff, MD      . 0.9 %  sodium chloride infusion   Intravenous Continuous Shela Leff, MD 125 mL/hr at 08/06/18 1214    . acetaminophen (TYLENOL) tablet 650 mg  650 mg Oral Q4H PRN Shela Leff, MD       Or  . acetaminophen (TYLENOL) solution 650 mg  650 mg Per Tube Q4H PRN Shela Leff, MD       Or  . acetaminophen (TYLENOL) suppository 650 mg  650 mg Rectal Q4H PRN Shela Leff, MD      . aspirin suppository 300 mg  300 mg Rectal Daily Shela Leff, MD   300 mg at 08/06/18 0932  . enoxaparin (LOVENOX) injection 40 mg  40 mg Subcutaneous Q24H Shela Leff, MD   40 mg at 08/06/18 1158  . levothyroxine (SYNTHROID, LEVOTHROID) injection 44 mcg  44 mcg Intravenous  Daily Shela Leff, MD   44 mcg at 08/06/18 1159  . senna-docusate (Senokot-S) tablet 1 tablet  1 tablet Oral QHS PRN Shela Leff, MD         Discharge Medications: Please see discharge summary for a list of discharge medications.  Relevant Imaging Results:  Relevant Lab Results:   Additional Information SS#: [id number]  Geralynn Ochs, LCSW

## 2018-08-06 NOTE — Progress Notes (Signed)
SLP Cancellation Note  Patient Details Name: Cathy Johnson MRN: [id number] DOB: 1928/09/27   Cancelled treatment:       Reason Eval/Treat Not Completed: Fatigue/lethargy limiting ability to participate. Pt currently insufficiently arousable for evaluation, even with verbal and tactile stim (cold wet washcloth, sternal rub). Will continue efforts.   Doil Kamara B. Quentin Ore [facility], CCC-SLP Speech Language Pathologist [phone]  Shonna Chock 08/06/2018, 9:45 AM

## 2018-08-06 NOTE — Discharge Summary (Addendum)
Physician Discharge Summary  Cathy Johnson:[id number] DOB: September 15, 1928 DOA: 08/05/2018  PCP: [facility]  Admit date: 08/05/2018 Discharge date: 08/06/2018  Time spent: 45 minutes  Recommendations for Outpatient Follow-up:  Patient will be discharged to skilled nursing facility, physical, occupational, speech therapy.  Patient will need to follow up with primary care provider within one week of discharge.  Follow up with neurology. Patient should continue medications as prescribed.  Patient should follow a dysphagia 2 diet.   Discharge Diagnoses:  Acute CVA Elevated alk phos Chronic anemia Essential hypertension Hypothyroidism Dementia  Discharge Condition: stable  Diet recommendation: dysphagia 2  Filed Weights   08/05/18 1712  Weight: 73.8 kg    History of present illness:  On 08/05/2018 by Dr. Shela Leff Cathy Johnson is a 84 y.o. female with medical history significant of dementia, hypertension, stroke presenting to the hospital from her nursing home as a code stroke.  Patient was extremely drowsy and no history could be obtained from her.  Per documentation from the ED, patient was walking to the bathroom with staff at the nursing home when she had a sudden change in consciousness at 1540.  Staff noted right facial droop as well.  She has a history of prior stroke with no residual deficits. Patient was admitted to [facility] from October 1-3 for altered mental status which was thought to be likely secondary to worsening of her dementia.  No infectious source was found.  Hospital Course:  Acute CVA -Presented from nursing facility for altered mental status and facial droop -CT head negative for acute finding -Neurology consulted and appreciated, thought this to be due to toxic and metabolic encephalopathy or infection -CTA head neck showed no stroke.  Minimal atherosclerosis.  Minimal right ICA fibromuscular changes. -Patient seems to  have improved and possibly back to patient's baseline -Ammonia level normal, ALT AST normal -MRI brain revealed small acute or early subacute infarct in the medial right thalamus, no hemorrhage or mass-effect -Echocardiogram showed an EF of 55 to 60%, no source of embolus -PT, OT recommended SNF -No source of infection, UA and chest x-ray unremarkable for infection -Patient to follow with speech at nursing facility -Hemoglobin A1c 5.5, LDL 63 -Patient was on aspirin as well as Plavix prior to admission.  Allergy recommended resumption of these medications along with statin.  **Depakote noted on MAR, however could not verify. Patient will need to follow up with PCP for uptodate medication list.   Elevated alk phos -Currently stable, 105 -Patient with normal AST and ALT, T bilirubin -Seems that this was also elevated approximately 2 weeks ago -Patient to follow-up with PCP as an outpatient  Chronic anemia -Hemoglobin currently stable  Essential hypertension -Blood pressure noted to be 202/117 in the ED -Patient was given IV labetalol -Given acute CVA, would allow for permissive hypertension  Hypothyroidism -Continue Synthroid  Dementia -stable, continue namenda   Code status: DNR  Procedures: Echocardiogram  Consultations: Neurology   Discharge Exam: Vitals:   08/06/18 1111 08/06/18 1113  BP:  (!) 167/69  Pulse:    Resp:    Temp: 98.6 F (37 C)   SpO2:       General: Well developed, elderly, NAD  HEENT: NCAT, mucous membranes moist.  Neck: Supple  Cardiovascular: S1 S2 auscultated, RRR  Respiratory: Clear to auscultation bilaterally with equal chest rise  Abdomen: Soft, nontender, nondistended, + bowel sounds  Extremities: warm dry without cyanosis clubbing or edema  Neuro: Awake and alert.  Not oriented. (H/o dementia)  Discharge Instructions Discharge Instructions    Discharge instructions   Complete by:  As directed    Patient will be discharged  to skilled nursing facility, physical, occupational, speech therapy.  Patient will need to follow up with primary care provider within one week of discharge.  Follow up with neurology. Patient should continue medications as prescribed.  Patient should follow a dysphagia 2 diet.   Patient needs to follow up closely with PCP for up to date medication list.     Allergies as of 08/06/2018      Reactions   Boniva [ibandronic Acid] Other (See Comments)   "Allergic," per [facility]   Crestor [rosuvastatin Calcium] Other (See Comments)   "Allergic," per [facility]   Exelon [rivastigmine Tartrate] Other (See Comments)   "Allergic," per [facility]   Fosamax [alendronate Sodium] Other (See Comments)   "Allergic," per [facility]   Galantamine Other (See Comments)   "Allergic," per [facility]   Lipitor [atorvastatin] Other (See Comments)   "Allergic," per [facility]   Morphine And Related Other (See Comments)   "Allergic," per [facility]   Other Other (See Comments)   "Sedatives" and "Multiple, unknown B/P meds" = "Allergic," per [facility]   Vagifem [estradiol] Other (See Comments)   "Allergic," per [facility]      Medication List    STOP taking these medications   divalproex 250 MG DR tablet Commonly known as:  DEPAKOTE     TAKE these medications   acetaminophen 500 MG tablet Commonly known as:  TYLENOL Take 1,000 mg by mouth 2 (two) times daily. What changed:  Another medication with the same name was removed. Continue taking this medication, and follow the directions you see here.   aspirin 81 MG chewable tablet Chew 81 mg by mouth daily.   atorvastatin 20 MG tablet Commonly known as:  LIPITOR Take 20 mg by mouth at bedtime.   bisacodyl 10 MG/30ML Enem Commonly known as:  FLEET Place 10 mg rectally as needed (for constipation/if no relief or abdominal pain, CALL MD).   CALCI-CHEW 1250 (500 Ca) MG chewable tablet Generic drug:  calcium carbonate Chew 1 tablet by mouth 2 (two) times daily.   clopidogrel 75 MG tablet Commonly known  as:  PLAVIX Take 1 tablet (75 mg total) by mouth daily.   diclofenac sodium 1 % Gel Commonly known as:  VOLTAREN Apply 4 g topically See admin instructions. Apply 4 grams to both knees two times a day and an additional 4 grams to both knees two times a day as needed for arthritis pain   ferrous sulfate 325 (65 FE) MG tablet Take 325 mg by mouth daily with breakfast.   guaifenesin 100 MG/5ML syrup Commonly known as:  ROBITUSSIN Take 200 mg by mouth every 6 (six) hours as needed (for 3 days, for coughing and notify MD if coughs persists for more than 3 days).   levothyroxine 88 MCG tablet Commonly known as:  SYNTHROID, LEVOTHROID Take 88 mcg by mouth daily before breakfast.   lisinopril 40 MG tablet Commonly known as:  PRINIVIL,ZESTRIL Take 40 mg by mouth daily.   loperamide 2 MG tablet Commonly known as:  IMODIUM A-D Take 2 mg by mouth See admin instructions. Take 2 mg by mouth after each loose stool as needed/max humber of doses, up to 3/12 hours and notify MD if no relief after 24 hrs   LORazepam 0.5 MG tablet Commonly known as:  ATIVAN Take 1 tablet (0.5 mg total) by mouth  2 (two) times daily. What changed:    when to take this  additional instructions   memantine 10 MG tablet Commonly known as:  NAMENDA Take 10 mg by mouth 2 (two) times daily.   polyethylene glycol packet Commonly known as:  MIRALAX / GLYCOLAX Take 17 g by mouth daily.   risperiDONE 0.5 MG tablet Commonly known as:  RISPERDAL Take 0.5 mg by mouth See admin instructions. Take 0.5 mg by mouth two times a day- at 2 PM and 8 PM   sertraline 50 MG tablet Commonly known as:  ZOLOFT Take 75 mg by mouth daily.   telmisartan 80 MG tablet Commonly known as:  MICARDIS Take 80 mg by mouth daily.   vitamin B-12 500 MCG tablet Commonly known as:  CYANOCOBALAMIN Take 500 mcg by mouth daily.   Vitamin D3 2000 units Tabs Take 2,000 Units by mouth daily.      Allergies  Allergen Reactions  . Boniva  [Ibandronic Acid] Other (See Comments)    "Allergic," per [facility]  . Crestor [Rosuvastatin Calcium] Other (See Comments)    "Allergic," per [facility]  . Exelon [Rivastigmine Tartrate] Other (See Comments)    "Allergic," per [facility]  . Fosamax [Alendronate Sodium] Other (See Comments)    "Allergic," per [facility]  . Galantamine Other (See Comments)    "Allergic," per [facility]  . Lipitor [Atorvastatin] Other (See Comments)    "Allergic," per [facility]  . Morphine And Related Other (See Comments)    "Allergic," per [facility]  . Other Other (See Comments)    "Sedatives" and "Multiple, unknown B/P meds" = "Allergic," per [facility]  . Vagifem [Estradiol] Other (See Comments)    "Allergic," per [facility]   Follow-up Information    [facility] Schedule an appointment as soon as possible for a visit in 1 week(s).   Specialty:  Geriatric Medicine Why:  Hospital follow up Contact information: [address] [place]  [place] [address] [phone]            The results of significant diagnostics from this hospitalization (including imaging, microbiology, ancillary and laboratory) are listed below for reference.    Significant Diagnostic Studies: Ct Angio Head W Or Wo Contrast  Result Date: 08/05/2018 CLINICAL DATA:  Code stroke. Right-sided facial droop. Negative acute head CT. EXAM: CT ANGIOGRAPHY HEAD AND NECK TECHNIQUE: Multidetector CT imaging of the head and neck was performed using the standard protocol during bolus administration of intravenous contrast. Multiplanar CT image reconstructions and MIPs were obtained to evaluate the vascular anatomy. Carotid stenosis measurements (when applicable) are obtained utilizing NASCET criteria, using the distal internal carotid diameter as the denominator. CONTRAST:  63m ISOVUE-370 IOPAMIDOL (ISOVUE-370) INJECTION 76% COMPARISON:  Head CT earlier same day and multiple previous. FINDINGS: CTA NECK FINDINGS Aortic arch: Aortic atherosclerosis. No aneurysm. Branching  pattern is normal. Right carotid system: Common carotid artery widely patent to the bifurcation. Mild atherosclerotic plaque at the carotid bifurcation but no stenosis or irregularity. Cervical ICA is tortuous. Mild fibromuscular change beneath the skull base. Left carotid system: Common carotid artery widely patent to the bifurcation. Minimal calcified plaque at the carotid bifurcation but no stenosis or irregularity. Cervical ICA is tortuous but unremarkable otherwise. Vertebral arteries: No subclavian disease of significance. Both vertebral artery origins appear sufficiently patent. Left vertebral artery is dominant. Both vertebral arteries are patent through the cervical region to the foramen magnum. Skeleton: Chronic degenerative spondylosis. Other neck: Thyroid goiter.  No other soft tissue finding. Upper chest: Negative  Review of the MIP images confirms the above findings CTA HEAD FINDINGS Anterior circulation: Both internal carotid arteries are patent through the skull base and siphon regions. The anterior and middle cerebral vessels are patent without proximal stenosis, aneurysm or vascular malformation. No missing branch vessel identified. Posterior circulation: Both vertebral arteries are patent to the basilar. No basilar stenosis. Posterior circulation branch vessels are patent. Posterior cerebral arteries receive most of the supply from the anterior circulation. Venous sinuses: Patent and normal. Anatomic variants: None significant. Delayed phase: No abnormal enhancement. Review of the MIP images confirms the above findings IMPRESSION: No acute brain finding identified. No likely significant vascular disease. Minimal atherosclerotic change at both carotid bifurcations without stenosis or irregularity. No intracranial branch vessel stenosis or occlusion identified. Minimal fibromuscular change of the cervical ICA on the right beneath the skull base, probably not relevant. These results were communicated  to Dr. Cheral Marker At 5:33 pmon 10/17/2019by text page via the [facility] messaging system. Electronically Signed   By: Nelson Chimes M.D.   On: 08/05/2018 17:35   Ct Head Wo Contrast  Result Date: 07/21/2018 CLINICAL DATA:  Altered level of consciousness EXAM: CT HEAD WITHOUT CONTRAST TECHNIQUE: Contiguous axial images were obtained from the base of the skull through the vertex without intravenous contrast. COMPARISON:  05/16/2018 FINDINGS: Brain: No evidence of acute infarction, hemorrhage, hydrocephalus, extra-axial collection or mass lesion/mass effect. Global cortical and central atrophy. Subcortical white matter and periventricular small vessel ischemic changes. Vascular: Mild intracranial atherosclerosis. Skull: Normal. Negative for fracture or focal lesion. Sinuses/Orbits: The visualized paranasal sinuses are essentially clear. The mastoid air cells are unopacified. Other: None. IMPRESSION: No evidence of acute intracranial abnormality. Atrophy with small vessel ischemic changes. Electronically Signed   By: Julian Hy M.D.   On: 07/21/2018 00:13   Ct Angio Neck W Or Wo Contrast  Result Date: 08/05/2018 CLINICAL DATA:  Code stroke. Right-sided facial droop. Negative acute head CT. EXAM: CT ANGIOGRAPHY HEAD AND NECK TECHNIQUE: Multidetector CT imaging of the head and neck was performed using the standard protocol during bolus administration of intravenous contrast. Multiplanar CT image reconstructions and MIPs were obtained to evaluate the vascular anatomy. Carotid stenosis measurements (when applicable) are obtained utilizing NASCET criteria, using the distal internal carotid diameter as the denominator. CONTRAST:  92m ISOVUE-370 IOPAMIDOL (ISOVUE-370) INJECTION 76% COMPARISON:  Head CT earlier same day and multiple previous. FINDINGS: CTA NECK FINDINGS Aortic arch: Aortic atherosclerosis. No aneurysm. Branching pattern is normal. Right carotid system: Common carotid artery widely patent to the  bifurcation. Mild atherosclerotic plaque at the carotid bifurcation but no stenosis or irregularity. Cervical ICA is tortuous. Mild fibromuscular change beneath the skull base. Left carotid system: Common carotid artery widely patent to the bifurcation. Minimal calcified plaque at the carotid bifurcation but no stenosis or irregularity. Cervical ICA is tortuous but unremarkable otherwise. Vertebral arteries: No subclavian disease of significance. Both vertebral artery origins appear sufficiently patent. Left vertebral artery is dominant. Both vertebral arteries are patent through the cervical region to the foramen magnum. Skeleton: Chronic degenerative spondylosis. Other neck: Thyroid goiter.  No other soft tissue finding. Upper chest: Negative Review of the MIP images confirms the above findings CTA HEAD FINDINGS Anterior circulation: Both internal carotid arteries are patent through the skull base and siphon regions. The anterior and middle cerebral vessels are patent without proximal stenosis, aneurysm or vascular malformation. No missing branch vessel identified. Posterior circulation: Both vertebral arteries are patent to the basilar. No basilar stenosis. Posterior circulation  branch vessels are patent. Posterior cerebral arteries receive most of the supply from the anterior circulation. Venous sinuses: Patent and normal. Anatomic variants: None significant. Delayed phase: No abnormal enhancement. Review of the MIP images confirms the above findings IMPRESSION: No acute brain finding identified. No likely significant vascular disease. Minimal atherosclerotic change at both carotid bifurcations without stenosis or irregularity. No intracranial branch vessel stenosis or occlusion identified. Minimal fibromuscular change of the cervical ICA on the right beneath the skull base, probably not relevant. These results were communicated to Dr. Cheral Marker At 5:33 pmon 10/17/2019by text page via the [facility] messaging system.  Electronically Signed   By: Nelson Chimes M.D.   On: 08/05/2018 17:35   Mr Jeri Cos And Wo Contrast  Result Date: 08/05/2018 CLINICAL DATA:  Right facial droop and altered mental status. EXAM: MRI HEAD WITHOUT AND WITH CONTRAST TECHNIQUE: Multiplanar, multiecho pulse sequences of the brain and surrounding structures were obtained without and with intravenous contrast. CONTRAST:  7 mL Gadavist COMPARISON:  CTA head and neck 08/05/2018 FINDINGS: BRAIN: Small focus of abnormal diffusion restriction within the medial left thalamus. No other diffusion abnormality. The midline structures are normal. No midline shift or other mass effect. Multiple old cerebellar infarcts. Old infarcts of the left basal ganglia and right thalamus. Multifocal white matter hyperintensity, most commonly due to chronic ischemic microangiopathy. Generalized atrophy without lobar predilection. Scattered chronic microhemmorhages in a peripheral predominant distribution. No VASCULAR: Major intracranial arterial and venous sinus flow voids are normal. SKULL AND UPPER CERVICAL SPINE: Calvarial bone marrow signal is normal. There is no skull base mass. Visualized upper cervical spine and soft tissues are normal. SINUSES/ORBITS: No fluid levels or advanced mucosal thickening. No mastoid or middle ear effusion. The orbits are normal. IMPRESSION: 1. Small acute or early subacute infarct of the medial right thalamus. No hemorrhage or mass effect. 2. Chronic ischemic microangiopathy and generalized atrophy. 3. 3-5 scattered chronic microhemorrhages in a peripheral distribution, nonspecific. Electronically Signed   By: Ulyses Jarred M.D.   On: 08/05/2018 21:45   Dg Chest Portable 1 View  Result Date: 08/05/2018 CLINICAL DATA:  Fatigue.  Mental status changes. EXAM: PORTABLE CHEST 1 VIEW COMPARISON:  07/20/2018 FINDINGS: Borderline cardiomegaly. Chronic aortic atherosclerosis. Lungs appear clear. The vascularity is normal. No effusions. No visible  bone abnormality. IMPRESSION: No active disease. Electronically Signed   By: Nelson Chimes M.D.   On: 08/05/2018 17:59   Dg Chest Port 1 View  Result Date: 07/21/2018 CLINICAL DATA:  Cough EXAM: PORTABLE CHEST 1 VIEW COMPARISON:  09/14/2016 FINDINGS: Lungs are clear.  No pleural effusion or pneumothorax. The heart is normal in size. IMPRESSION: No evidence of acute cardiopulmonary disease. Electronically Signed   By: Julian Hy M.D.   On: 07/21/2018 00:12   Ct Head Code Stroke Wo Contrast  Result Date: 08/05/2018 CLINICAL DATA:  Code stroke.  Slurred speech.  Right facial droop. EXAM: CT HEAD WITHOUT CONTRAST TECHNIQUE: Contiguous axial images were obtained from the base of the skull through the vertex without intravenous contrast. COMPARISON:  07/20/2018.  Multiple previous. FINDINGS: Brain: Generalized atrophy with frontal and temporal predominance. No sign of acute infarction, mass lesion, hemorrhage, hydrocephalus or extra-axial collection. Old lacunar infarction right thalamus. Vascular: There is atherosclerotic calcification of the major vessels at the base of the brain. Skull: Negative Sinuses/Orbits: Clear/normal Other: None ASPECTS ([place] Stroke Program Early CT Score) - Ganglionic level infarction (caudate, lentiform nuclei, internal capsule, insula, M1-M3 cortex): 7 - Supraganglionic infarction (M4-M6 cortex):  3 Total score (0-10 with 10 being normal): 10 IMPRESSION: 1. No acute finding by CT. Generalized atrophy, frontal and temporal predominant. Old lacunar infarction right thalamus. 2. ASPECTS is 10. 3. These results were called by telephone at the time of interpretation on 08/05/2018 at 5:01 pm to Dr. Isla Pence , who verbally acknowledged these results. Electronically Signed   By: Nelson Chimes M.D.   On: 08/05/2018 17:02    Microbiology: No results found for this or any previous visit (from the past 240 hour(s)).   Labs: Basic Metabolic Panel: Recent Labs  Lab  08/05/18 1648 08/05/18 1700 08/06/18 0359  NA 140 139 141  K 4.0 5.6* 4.1  CL 106 106 110  CO2 23  --  23  GLUCOSE 102* 101* 90  BUN 20 32* 17  CREATININE 1.07* 1.10* 0.99  CALCIUM 9.2  --  8.7*   Liver Function Tests: Recent Labs  Lab 08/05/18 1648 08/06/18 0359  AST 19 17  ALT 19 16  ALKPHOS 131* 105  BILITOT 0.4 0.4  PROT 6.2* 5.5*  ALBUMIN 3.7 3.3*   No results for input(s): LIPASE, AMYLASE in the last 168 hours. Recent Labs  Lab 08/05/18 1947  AMMONIA 13   CBC: Recent Labs  Lab 08/05/18 1648 08/05/18 1700  WBC 7.0  --   NEUTROABS 4.3  --   HGB 10.7* 11.6*  HCT 35.2* 34.0*  MCV 94.9  --   PLT 235  --    Cardiac Enzymes: No results for input(s): CKTOTAL, CKMB, CKMBINDEX, TROPONINI in the last 168 hours. BNP: BNP (last 3 results) No results for input(s): BNP in the last 8760 hours.  ProBNP (last 3 results) No results for input(s): PROBNP in the last 8760 hours.  CBG: Recent Labs  Lab 08/05/18 1648  GLUCAP 91       Signed:  [facility] 08/06/2018, 4:13 PM

## 2018-08-12 DIAGNOSIS — I10 Essential (primary) hypertension: Secondary | ICD-10-CM | POA: Diagnosis not present

## 2018-08-12 DIAGNOSIS — E038 Other specified hypothyroidism: Secondary | ICD-10-CM | POA: Diagnosis not present

## 2018-08-12 DIAGNOSIS — M1990 Unspecified osteoarthritis, unspecified site: Secondary | ICD-10-CM | POA: Diagnosis not present

## 2018-08-12 DIAGNOSIS — E785 Hyperlipidemia, unspecified: Secondary | ICD-10-CM | POA: Diagnosis not present

## 2018-08-12 DIAGNOSIS — F0151 Vascular dementia with behavioral disturbance: Secondary | ICD-10-CM | POA: Diagnosis not present

## 2018-08-12 DIAGNOSIS — Z79899 Other long term (current) drug therapy: Secondary | ICD-10-CM | POA: Diagnosis not present

## 2018-08-16 DIAGNOSIS — E039 Hypothyroidism, unspecified: Secondary | ICD-10-CM | POA: Diagnosis not present

## 2018-08-16 DIAGNOSIS — E785 Hyperlipidemia, unspecified: Secondary | ICD-10-CM | POA: Diagnosis not present

## 2018-08-16 DIAGNOSIS — Z9181 History of falling: Secondary | ICD-10-CM | POA: Diagnosis not present

## 2018-08-16 DIAGNOSIS — M1711 Unilateral primary osteoarthritis, right knee: Secondary | ICD-10-CM | POA: Diagnosis not present

## 2018-08-16 DIAGNOSIS — I10 Essential (primary) hypertension: Secondary | ICD-10-CM | POA: Diagnosis not present

## 2018-08-16 DIAGNOSIS — E559 Vitamin D deficiency, unspecified: Secondary | ICD-10-CM | POA: Diagnosis not present

## 2018-08-16 DIAGNOSIS — D649 Anemia, unspecified: Secondary | ICD-10-CM | POA: Diagnosis not present

## 2018-08-16 DIAGNOSIS — F0280 Dementia in other diseases classified elsewhere without behavioral disturbance: Secondary | ICD-10-CM | POA: Diagnosis not present

## 2018-08-16 DIAGNOSIS — M810 Age-related osteoporosis without current pathological fracture: Secondary | ICD-10-CM | POA: Diagnosis not present

## 2018-08-16 DIAGNOSIS — Z8673 Personal history of transient ischemic attack (TIA), and cerebral infarction without residual deficits: Secondary | ICD-10-CM | POA: Diagnosis not present

## 2018-08-18 DIAGNOSIS — I10 Essential (primary) hypertension: Secondary | ICD-10-CM | POA: Diagnosis not present

## 2018-08-18 DIAGNOSIS — D649 Anemia, unspecified: Secondary | ICD-10-CM | POA: Diagnosis not present

## 2018-08-18 DIAGNOSIS — M1711 Unilateral primary osteoarthritis, right knee: Secondary | ICD-10-CM | POA: Diagnosis not present

## 2018-08-18 DIAGNOSIS — F0280 Dementia in other diseases classified elsewhere without behavioral disturbance: Secondary | ICD-10-CM | POA: Diagnosis not present

## 2018-08-18 DIAGNOSIS — E039 Hypothyroidism, unspecified: Secondary | ICD-10-CM | POA: Diagnosis not present

## 2018-08-18 DIAGNOSIS — M810 Age-related osteoporosis without current pathological fracture: Secondary | ICD-10-CM | POA: Diagnosis not present

## 2018-08-19 DIAGNOSIS — D649 Anemia, unspecified: Secondary | ICD-10-CM | POA: Diagnosis not present

## 2018-08-19 DIAGNOSIS — I10 Essential (primary) hypertension: Secondary | ICD-10-CM | POA: Diagnosis not present

## 2018-08-19 DIAGNOSIS — E039 Hypothyroidism, unspecified: Secondary | ICD-10-CM | POA: Diagnosis not present

## 2018-08-19 DIAGNOSIS — M1711 Unilateral primary osteoarthritis, right knee: Secondary | ICD-10-CM | POA: Diagnosis not present

## 2018-08-19 DIAGNOSIS — M810 Age-related osteoporosis without current pathological fracture: Secondary | ICD-10-CM | POA: Diagnosis not present

## 2018-08-19 DIAGNOSIS — F0280 Dementia in other diseases classified elsewhere without behavioral disturbance: Secondary | ICD-10-CM | POA: Diagnosis not present

## 2018-08-20 DIAGNOSIS — L853 Xerosis cutis: Secondary | ICD-10-CM | POA: Diagnosis not present

## 2018-08-20 DIAGNOSIS — M810 Age-related osteoporosis without current pathological fracture: Secondary | ICD-10-CM | POA: Diagnosis not present

## 2018-08-20 DIAGNOSIS — F0151 Vascular dementia with behavioral disturbance: Secondary | ICD-10-CM | POA: Diagnosis not present

## 2018-08-20 DIAGNOSIS — E039 Hypothyroidism, unspecified: Secondary | ICD-10-CM | POA: Diagnosis not present

## 2018-08-20 DIAGNOSIS — F0280 Dementia in other diseases classified elsewhere without behavioral disturbance: Secondary | ICD-10-CM | POA: Diagnosis not present

## 2018-08-20 DIAGNOSIS — M1711 Unilateral primary osteoarthritis, right knee: Secondary | ICD-10-CM | POA: Diagnosis not present

## 2018-08-20 DIAGNOSIS — I10 Essential (primary) hypertension: Secondary | ICD-10-CM | POA: Diagnosis not present

## 2018-08-20 DIAGNOSIS — D649 Anemia, unspecified: Secondary | ICD-10-CM | POA: Diagnosis not present

## 2018-08-23 DIAGNOSIS — D518 Other vitamin B12 deficiency anemias: Secondary | ICD-10-CM | POA: Diagnosis not present

## 2018-08-23 DIAGNOSIS — E119 Type 2 diabetes mellitus without complications: Secondary | ICD-10-CM | POA: Diagnosis not present

## 2018-08-23 DIAGNOSIS — M1711 Unilateral primary osteoarthritis, right knee: Secondary | ICD-10-CM | POA: Diagnosis not present

## 2018-08-23 DIAGNOSIS — M810 Age-related osteoporosis without current pathological fracture: Secondary | ICD-10-CM | POA: Diagnosis not present

## 2018-08-23 DIAGNOSIS — E782 Mixed hyperlipidemia: Secondary | ICD-10-CM | POA: Diagnosis not present

## 2018-08-23 DIAGNOSIS — I10 Essential (primary) hypertension: Secondary | ICD-10-CM | POA: Diagnosis not present

## 2018-08-23 DIAGNOSIS — E039 Hypothyroidism, unspecified: Secondary | ICD-10-CM | POA: Diagnosis not present

## 2018-08-23 DIAGNOSIS — D649 Anemia, unspecified: Secondary | ICD-10-CM | POA: Diagnosis not present

## 2018-08-23 DIAGNOSIS — E038 Other specified hypothyroidism: Secondary | ICD-10-CM | POA: Diagnosis not present

## 2018-08-23 DIAGNOSIS — F0280 Dementia in other diseases classified elsewhere without behavioral disturbance: Secondary | ICD-10-CM | POA: Diagnosis not present

## 2018-08-24 DIAGNOSIS — M1711 Unilateral primary osteoarthritis, right knee: Secondary | ICD-10-CM | POA: Diagnosis not present

## 2018-08-24 DIAGNOSIS — E039 Hypothyroidism, unspecified: Secondary | ICD-10-CM | POA: Diagnosis not present

## 2018-08-24 DIAGNOSIS — F0280 Dementia in other diseases classified elsewhere without behavioral disturbance: Secondary | ICD-10-CM | POA: Diagnosis not present

## 2018-08-24 DIAGNOSIS — D649 Anemia, unspecified: Secondary | ICD-10-CM | POA: Diagnosis not present

## 2018-08-24 DIAGNOSIS — I10 Essential (primary) hypertension: Secondary | ICD-10-CM | POA: Diagnosis not present

## 2018-08-24 DIAGNOSIS — M810 Age-related osteoporosis without current pathological fracture: Secondary | ICD-10-CM | POA: Diagnosis not present

## 2018-08-25 DIAGNOSIS — M1711 Unilateral primary osteoarthritis, right knee: Secondary | ICD-10-CM | POA: Diagnosis not present

## 2018-08-25 DIAGNOSIS — F0280 Dementia in other diseases classified elsewhere without behavioral disturbance: Secondary | ICD-10-CM | POA: Diagnosis not present

## 2018-08-25 DIAGNOSIS — E039 Hypothyroidism, unspecified: Secondary | ICD-10-CM | POA: Diagnosis not present

## 2018-08-25 DIAGNOSIS — D649 Anemia, unspecified: Secondary | ICD-10-CM | POA: Diagnosis not present

## 2018-08-25 DIAGNOSIS — M810 Age-related osteoporosis without current pathological fracture: Secondary | ICD-10-CM | POA: Diagnosis not present

## 2018-08-25 DIAGNOSIS — I10 Essential (primary) hypertension: Secondary | ICD-10-CM | POA: Diagnosis not present

## 2018-08-26 DIAGNOSIS — I10 Essential (primary) hypertension: Secondary | ICD-10-CM | POA: Diagnosis not present

## 2018-08-26 DIAGNOSIS — M810 Age-related osteoporosis without current pathological fracture: Secondary | ICD-10-CM | POA: Diagnosis not present

## 2018-08-26 DIAGNOSIS — F419 Anxiety disorder, unspecified: Secondary | ICD-10-CM | POA: Diagnosis not present

## 2018-08-26 DIAGNOSIS — F0280 Dementia in other diseases classified elsewhere without behavioral disturbance: Secondary | ICD-10-CM | POA: Diagnosis not present

## 2018-08-26 DIAGNOSIS — E039 Hypothyroidism, unspecified: Secondary | ICD-10-CM | POA: Diagnosis not present

## 2018-08-26 DIAGNOSIS — F0151 Vascular dementia with behavioral disturbance: Secondary | ICD-10-CM | POA: Diagnosis not present

## 2018-08-26 DIAGNOSIS — D649 Anemia, unspecified: Secondary | ICD-10-CM | POA: Diagnosis not present

## 2018-08-26 DIAGNOSIS — M1711 Unilateral primary osteoarthritis, right knee: Secondary | ICD-10-CM | POA: Diagnosis not present

## 2018-08-27 DIAGNOSIS — I10 Essential (primary) hypertension: Secondary | ICD-10-CM | POA: Diagnosis not present

## 2018-08-27 DIAGNOSIS — D649 Anemia, unspecified: Secondary | ICD-10-CM | POA: Diagnosis not present

## 2018-08-27 DIAGNOSIS — M1711 Unilateral primary osteoarthritis, right knee: Secondary | ICD-10-CM | POA: Diagnosis not present

## 2018-08-27 DIAGNOSIS — F0280 Dementia in other diseases classified elsewhere without behavioral disturbance: Secondary | ICD-10-CM | POA: Diagnosis not present

## 2018-08-27 DIAGNOSIS — M810 Age-related osteoporosis without current pathological fracture: Secondary | ICD-10-CM | POA: Diagnosis not present

## 2018-08-27 DIAGNOSIS — E039 Hypothyroidism, unspecified: Secondary | ICD-10-CM | POA: Diagnosis not present

## 2018-08-30 DIAGNOSIS — D649 Anemia, unspecified: Secondary | ICD-10-CM | POA: Diagnosis not present

## 2018-08-30 DIAGNOSIS — M1711 Unilateral primary osteoarthritis, right knee: Secondary | ICD-10-CM | POA: Diagnosis not present

## 2018-08-30 DIAGNOSIS — I10 Essential (primary) hypertension: Secondary | ICD-10-CM | POA: Diagnosis not present

## 2018-08-30 DIAGNOSIS — M810 Age-related osteoporosis without current pathological fracture: Secondary | ICD-10-CM | POA: Diagnosis not present

## 2018-08-30 DIAGNOSIS — E039 Hypothyroidism, unspecified: Secondary | ICD-10-CM | POA: Diagnosis not present

## 2018-08-30 DIAGNOSIS — F0280 Dementia in other diseases classified elsewhere without behavioral disturbance: Secondary | ICD-10-CM | POA: Diagnosis not present

## 2018-08-31 DIAGNOSIS — I10 Essential (primary) hypertension: Secondary | ICD-10-CM | POA: Diagnosis not present

## 2018-08-31 DIAGNOSIS — D649 Anemia, unspecified: Secondary | ICD-10-CM | POA: Diagnosis not present

## 2018-08-31 DIAGNOSIS — E039 Hypothyroidism, unspecified: Secondary | ICD-10-CM | POA: Diagnosis not present

## 2018-08-31 DIAGNOSIS — F0280 Dementia in other diseases classified elsewhere without behavioral disturbance: Secondary | ICD-10-CM | POA: Diagnosis not present

## 2018-08-31 DIAGNOSIS — M1711 Unilateral primary osteoarthritis, right knee: Secondary | ICD-10-CM | POA: Diagnosis not present

## 2018-08-31 DIAGNOSIS — M810 Age-related osteoporosis without current pathological fracture: Secondary | ICD-10-CM | POA: Diagnosis not present

## 2018-09-01 DIAGNOSIS — E039 Hypothyroidism, unspecified: Secondary | ICD-10-CM | POA: Diagnosis not present

## 2018-09-01 DIAGNOSIS — M810 Age-related osteoporosis without current pathological fracture: Secondary | ICD-10-CM | POA: Diagnosis not present

## 2018-09-01 DIAGNOSIS — I10 Essential (primary) hypertension: Secondary | ICD-10-CM | POA: Diagnosis not present

## 2018-09-01 DIAGNOSIS — F0280 Dementia in other diseases classified elsewhere without behavioral disturbance: Secondary | ICD-10-CM | POA: Diagnosis not present

## 2018-09-01 DIAGNOSIS — M1711 Unilateral primary osteoarthritis, right knee: Secondary | ICD-10-CM | POA: Diagnosis not present

## 2018-09-01 DIAGNOSIS — D649 Anemia, unspecified: Secondary | ICD-10-CM | POA: Diagnosis not present

## 2018-09-02 DIAGNOSIS — I10 Essential (primary) hypertension: Secondary | ICD-10-CM | POA: Diagnosis not present

## 2018-09-02 DIAGNOSIS — M810 Age-related osteoporosis without current pathological fracture: Secondary | ICD-10-CM | POA: Diagnosis not present

## 2018-09-02 DIAGNOSIS — E039 Hypothyroidism, unspecified: Secondary | ICD-10-CM | POA: Diagnosis not present

## 2018-09-02 DIAGNOSIS — F0280 Dementia in other diseases classified elsewhere without behavioral disturbance: Secondary | ICD-10-CM | POA: Diagnosis not present

## 2018-09-02 DIAGNOSIS — M1711 Unilateral primary osteoarthritis, right knee: Secondary | ICD-10-CM | POA: Diagnosis not present

## 2018-09-02 DIAGNOSIS — D649 Anemia, unspecified: Secondary | ICD-10-CM | POA: Diagnosis not present

## 2018-09-03 DIAGNOSIS — E039 Hypothyroidism, unspecified: Secondary | ICD-10-CM | POA: Diagnosis not present

## 2018-09-03 DIAGNOSIS — D649 Anemia, unspecified: Secondary | ICD-10-CM | POA: Diagnosis not present

## 2018-09-03 DIAGNOSIS — F0280 Dementia in other diseases classified elsewhere without behavioral disturbance: Secondary | ICD-10-CM | POA: Diagnosis not present

## 2018-09-03 DIAGNOSIS — M810 Age-related osteoporosis without current pathological fracture: Secondary | ICD-10-CM | POA: Diagnosis not present

## 2018-09-03 DIAGNOSIS — M1711 Unilateral primary osteoarthritis, right knee: Secondary | ICD-10-CM | POA: Diagnosis not present

## 2018-09-03 DIAGNOSIS — I10 Essential (primary) hypertension: Secondary | ICD-10-CM | POA: Diagnosis not present

## 2018-09-06 DIAGNOSIS — M810 Age-related osteoporosis without current pathological fracture: Secondary | ICD-10-CM | POA: Diagnosis not present

## 2018-09-06 DIAGNOSIS — D649 Anemia, unspecified: Secondary | ICD-10-CM | POA: Diagnosis not present

## 2018-09-06 DIAGNOSIS — E039 Hypothyroidism, unspecified: Secondary | ICD-10-CM | POA: Diagnosis not present

## 2018-09-06 DIAGNOSIS — F0280 Dementia in other diseases classified elsewhere without behavioral disturbance: Secondary | ICD-10-CM | POA: Diagnosis not present

## 2018-09-06 DIAGNOSIS — I10 Essential (primary) hypertension: Secondary | ICD-10-CM | POA: Diagnosis not present

## 2018-09-06 DIAGNOSIS — M1711 Unilateral primary osteoarthritis, right knee: Secondary | ICD-10-CM | POA: Diagnosis not present

## 2018-09-07 DIAGNOSIS — M1711 Unilateral primary osteoarthritis, right knee: Secondary | ICD-10-CM | POA: Diagnosis not present

## 2018-09-07 DIAGNOSIS — I10 Essential (primary) hypertension: Secondary | ICD-10-CM | POA: Diagnosis not present

## 2018-09-07 DIAGNOSIS — F0280 Dementia in other diseases classified elsewhere without behavioral disturbance: Secondary | ICD-10-CM | POA: Diagnosis not present

## 2018-09-07 DIAGNOSIS — M810 Age-related osteoporosis without current pathological fracture: Secondary | ICD-10-CM | POA: Diagnosis not present

## 2018-09-07 DIAGNOSIS — D649 Anemia, unspecified: Secondary | ICD-10-CM | POA: Diagnosis not present

## 2018-09-07 DIAGNOSIS — E039 Hypothyroidism, unspecified: Secondary | ICD-10-CM | POA: Diagnosis not present

## 2018-09-08 DIAGNOSIS — Z79899 Other long term (current) drug therapy: Secondary | ICD-10-CM | POA: Diagnosis not present

## 2018-09-08 DIAGNOSIS — M810 Age-related osteoporosis without current pathological fracture: Secondary | ICD-10-CM | POA: Diagnosis not present

## 2018-09-08 DIAGNOSIS — F0280 Dementia in other diseases classified elsewhere without behavioral disturbance: Secondary | ICD-10-CM | POA: Diagnosis not present

## 2018-09-08 DIAGNOSIS — E038 Other specified hypothyroidism: Secondary | ICD-10-CM | POA: Diagnosis not present

## 2018-09-08 DIAGNOSIS — I10 Essential (primary) hypertension: Secondary | ICD-10-CM | POA: Diagnosis not present

## 2018-09-08 DIAGNOSIS — M1711 Unilateral primary osteoarthritis, right knee: Secondary | ICD-10-CM | POA: Diagnosis not present

## 2018-09-08 DIAGNOSIS — E039 Hypothyroidism, unspecified: Secondary | ICD-10-CM | POA: Diagnosis not present

## 2018-09-08 DIAGNOSIS — D649 Anemia, unspecified: Secondary | ICD-10-CM | POA: Diagnosis not present

## 2018-09-10 DIAGNOSIS — M1990 Unspecified osteoarthritis, unspecified site: Secondary | ICD-10-CM | POA: Diagnosis not present

## 2018-09-10 DIAGNOSIS — F0280 Dementia in other diseases classified elsewhere without behavioral disturbance: Secondary | ICD-10-CM | POA: Diagnosis not present

## 2018-09-10 DIAGNOSIS — M1711 Unilateral primary osteoarthritis, right knee: Secondary | ICD-10-CM | POA: Diagnosis not present

## 2018-09-10 DIAGNOSIS — D649 Anemia, unspecified: Secondary | ICD-10-CM | POA: Diagnosis not present

## 2018-09-10 DIAGNOSIS — E039 Hypothyroidism, unspecified: Secondary | ICD-10-CM | POA: Diagnosis not present

## 2018-09-10 DIAGNOSIS — M810 Age-related osteoporosis without current pathological fracture: Secondary | ICD-10-CM | POA: Diagnosis not present

## 2018-09-10 DIAGNOSIS — F0151 Vascular dementia with behavioral disturbance: Secondary | ICD-10-CM | POA: Diagnosis not present

## 2018-09-10 DIAGNOSIS — E785 Hyperlipidemia, unspecified: Secondary | ICD-10-CM | POA: Diagnosis not present

## 2018-09-10 DIAGNOSIS — I10 Essential (primary) hypertension: Secondary | ICD-10-CM | POA: Diagnosis not present

## 2018-09-13 DIAGNOSIS — F0280 Dementia in other diseases classified elsewhere without behavioral disturbance: Secondary | ICD-10-CM | POA: Diagnosis not present

## 2018-09-13 DIAGNOSIS — I10 Essential (primary) hypertension: Secondary | ICD-10-CM | POA: Diagnosis not present

## 2018-09-13 DIAGNOSIS — M1711 Unilateral primary osteoarthritis, right knee: Secondary | ICD-10-CM | POA: Diagnosis not present

## 2018-09-13 DIAGNOSIS — E039 Hypothyroidism, unspecified: Secondary | ICD-10-CM | POA: Diagnosis not present

## 2018-09-13 DIAGNOSIS — M810 Age-related osteoporosis without current pathological fracture: Secondary | ICD-10-CM | POA: Diagnosis not present

## 2018-09-13 DIAGNOSIS — D649 Anemia, unspecified: Secondary | ICD-10-CM | POA: Diagnosis not present

## 2018-09-14 DIAGNOSIS — E038 Other specified hypothyroidism: Secondary | ICD-10-CM | POA: Diagnosis not present

## 2018-09-14 DIAGNOSIS — E039 Hypothyroidism, unspecified: Secondary | ICD-10-CM | POA: Diagnosis not present

## 2018-09-14 DIAGNOSIS — Z79899 Other long term (current) drug therapy: Secondary | ICD-10-CM | POA: Diagnosis not present

## 2018-09-14 DIAGNOSIS — D649 Anemia, unspecified: Secondary | ICD-10-CM | POA: Diagnosis not present

## 2018-09-14 DIAGNOSIS — M810 Age-related osteoporosis without current pathological fracture: Secondary | ICD-10-CM | POA: Diagnosis not present

## 2018-09-14 DIAGNOSIS — I10 Essential (primary) hypertension: Secondary | ICD-10-CM | POA: Diagnosis not present

## 2018-09-14 DIAGNOSIS — F0280 Dementia in other diseases classified elsewhere without behavioral disturbance: Secondary | ICD-10-CM | POA: Diagnosis not present

## 2018-09-14 DIAGNOSIS — M1711 Unilateral primary osteoarthritis, right knee: Secondary | ICD-10-CM | POA: Diagnosis not present

## 2018-09-17 ENCOUNTER — Observation Stay: Payer: Medicare Other

## 2018-09-17 ENCOUNTER — Emergency Department: Payer: Medicare Other

## 2018-09-17 ENCOUNTER — Inpatient Hospital Stay
Admission: EM | Admit: 2018-09-17 | Discharge: 2018-09-20 | DRG: 066 | Disposition: A | Discharge status: Skilled Nursing Facility | Payer: Medicare Other | Source: Skilled Nursing Facility | Attending: Specialist | Admitting: Specialist

## 2018-09-17 ENCOUNTER — Other Ambulatory Visit: Payer: Self-pay

## 2018-09-17 DIAGNOSIS — Z8673 Personal history of transient ischemic attack (TIA), and cerebral infarction without residual deficits: Secondary | ICD-10-CM

## 2018-09-17 DIAGNOSIS — I639 Cerebral infarction, unspecified: Secondary | ICD-10-CM | POA: Diagnosis not present

## 2018-09-17 DIAGNOSIS — R402422 Glasgow coma scale score 9-12, at arrival to emergency department: Secondary | ICD-10-CM | POA: Diagnosis present

## 2018-09-17 DIAGNOSIS — Z8249 Family history of ischemic heart disease and other diseases of the circulatory system: Secondary | ICD-10-CM

## 2018-09-17 DIAGNOSIS — R4182 Altered mental status, unspecified: Secondary | ICD-10-CM | POA: Diagnosis not present

## 2018-09-17 DIAGNOSIS — Z66 Do not resuscitate: Secondary | ICD-10-CM | POA: Diagnosis present

## 2018-09-17 DIAGNOSIS — R4701 Aphasia: Secondary | ICD-10-CM

## 2018-09-17 DIAGNOSIS — R404 Transient alteration of awareness: Secondary | ICD-10-CM | POA: Diagnosis not present

## 2018-09-17 DIAGNOSIS — R479 Unspecified speech disturbances: Secondary | ICD-10-CM | POA: Diagnosis not present

## 2018-09-17 DIAGNOSIS — E039 Hypothyroidism, unspecified: Secondary | ICD-10-CM | POA: Diagnosis present

## 2018-09-17 DIAGNOSIS — Z7902 Long term (current) use of antithrombotics/antiplatelets: Secondary | ICD-10-CM

## 2018-09-17 DIAGNOSIS — Z7982 Long term (current) use of aspirin: Secondary | ICD-10-CM

## 2018-09-17 DIAGNOSIS — Z96651 Presence of right artificial knee joint: Secondary | ICD-10-CM | POA: Diagnosis present

## 2018-09-17 DIAGNOSIS — I10 Essential (primary) hypertension: Secondary | ICD-10-CM | POA: Diagnosis present

## 2018-09-17 DIAGNOSIS — Z888 Allergy status to other drugs, medicaments and biological substances status: Secondary | ICD-10-CM

## 2018-09-17 DIAGNOSIS — Z79890 Hormone replacement therapy (postmenopausal): Secondary | ICD-10-CM

## 2018-09-17 DIAGNOSIS — G9340 Encephalopathy, unspecified: Secondary | ICD-10-CM

## 2018-09-17 DIAGNOSIS — F0390 Unspecified dementia without behavioral disturbance: Secondary | ICD-10-CM | POA: Diagnosis present

## 2018-09-17 DIAGNOSIS — Z79899 Other long term (current) drug therapy: Secondary | ICD-10-CM

## 2018-09-17 DIAGNOSIS — Z791 Long term (current) use of non-steroidal anti-inflammatories (NSAID): Secondary | ICD-10-CM

## 2018-09-17 DIAGNOSIS — I63233 Cerebral infarction due to unspecified occlusion or stenosis of bilateral carotid arteries: Secondary | ICD-10-CM | POA: Diagnosis not present

## 2018-09-17 LAB — CBC WITH DIFFERENTIAL/PLATELET
Abs Immature Granulocytes: 0.03 10*3/uL (ref 0.00–0.07)
BASOS PCT: 1 %
Basophils Absolute: 0 10*3/uL (ref 0.0–0.1)
EOS ABS: 0.4 10*3/uL (ref 0.0–0.5)
EOS PCT: 4 %
HCT: 41.2 % (ref 36.0–46.0)
Hemoglobin: 13.3 g/dL (ref 12.0–15.0)
Immature Granulocytes: 0 %
Lymphocytes Relative: 16 %
Lymphs Abs: 1.3 10*3/uL (ref 0.7–4.0)
MCH: 30.2 pg (ref 26.0–34.0)
MCHC: 32.3 g/dL (ref 30.0–36.0)
MCV: 93.4 fL (ref 80.0–100.0)
MONO ABS: 0.5 10*3/uL (ref 0.1–1.0)
MONOS PCT: 7 %
Neutro Abs: 6 10*3/uL (ref 1.7–7.7)
Neutrophils Relative %: 72 %
PLATELETS: 270 10*3/uL (ref 150–400)
RBC: 4.41 MIL/uL (ref 3.87–5.11)
RDW: 15.5 % (ref 11.5–15.5)
WBC: 8.2 10*3/uL (ref 4.0–10.5)
nRBC: 0 % (ref 0.0–0.2)

## 2018-09-17 LAB — COMPREHENSIVE METABOLIC PANEL
ALT: 13 U/L (ref 0–44)
ANION GAP: 10 (ref 5–15)
AST: 25 U/L (ref 15–41)
Albumin: 4 g/dL (ref 3.5–5.0)
Alkaline Phosphatase: 112 U/L (ref 38–126)
BUN: 24 mg/dL — ABNORMAL HIGH (ref 8–23)
CALCIUM: 9 mg/dL (ref 8.9–10.3)
CO2: 23 mmol/L (ref 22–32)
CREATININE: 0.88 mg/dL (ref 0.44–1.00)
Chloride: 109 mmol/L (ref 98–111)
GFR, EST NON AFRICAN AMERICAN: 59 mL/min — AB (ref >60)
Glucose, Bld: 104 mg/dL — ABNORMAL HIGH (ref 70–99)
POTASSIUM: 5.3 mmol/L — AB (ref 3.5–5.1)
Sodium: 142 mmol/L (ref 135–145)
Total Bilirubin: 1.1 mg/dL (ref 0.3–1.2)
Total Protein: 6.9 g/dL (ref 6.5–8.1)

## 2018-09-17 LAB — URINALYSIS, COMPLETE (UACMP) WITH MICROSCOPIC
Bacteria, UA: NONE SEEN
Bilirubin Urine: NEGATIVE
GLUCOSE, UA: NEGATIVE mg/dL
HGB URINE DIPSTICK: NEGATIVE
Ketones, ur: NEGATIVE mg/dL
Leukocytes, UA: NEGATIVE
NITRITE: NEGATIVE
PROTEIN: NEGATIVE mg/dL
Specific Gravity, Urine: 1.023 (ref 1.005–1.030)
pH: 5 (ref 5.0–8.0)

## 2018-09-17 LAB — TROPONIN I: Troponin I: <0.03 ng/mL (ref <0.03)

## 2018-09-17 LAB — CG4 I-STAT (LACTIC ACID): LACTIC ACID, VENOUS: 1.5 mmol/L (ref 0.5–1.9)

## 2018-09-17 LAB — MRSA PCR SCREENING: MRSA by PCR: NEGATIVE

## 2018-09-17 MED ORDER — STROKE: EARLY STAGES OF RECOVERY BOOK
Freq: Once | Status: AC
  Administered 2018-09-17: 15:00:00 1

## 2018-09-17 MED ORDER — SERTRALINE HCL 50 MG PO TABS
75.0000 mg | ORAL_TABLET | Freq: Every day | ORAL | Status: DC
  Administered 2018-09-19 – 2018-09-20 (×2): 75 mg via ORAL
  Filled 2018-09-17 (×2): qty 2

## 2018-09-17 MED ORDER — SODIUM CHLORIDE 0.9% FLUSH: 3.0000 mL | INTRAVENOUS | Status: DC | PRN

## 2018-09-17 MED ORDER — ACETAMINOPHEN 160 MG/5ML PO SOLN
650.0000 mg | ORAL | Status: DC | PRN
  Filled 2018-09-17: qty 20.3

## 2018-09-17 MED ORDER — IRBESARTAN 150 MG PO TABS
75.0000 mg | ORAL_TABLET | Freq: Every day | ORAL | Status: DC
  Administered 2018-09-19 – 2018-09-20 (×2): 75 mg via ORAL
  Filled 2018-09-17 (×2): qty 1

## 2018-09-17 MED ORDER — CLOPIDOGREL BISULFATE 75 MG PO TABS
75.0000 mg | ORAL_TABLET | Freq: Every day | ORAL | Status: DC
  Administered 2018-09-19 – 2018-09-20 (×2): 75 mg via ORAL
  Filled 2018-09-17 (×2): qty 1

## 2018-09-17 MED ORDER — ACETAMINOPHEN 325 MG PO TABS: 650.0000 mg | ORAL_TABLET | ORAL | Status: DC | PRN

## 2018-09-17 MED ORDER — ATORVASTATIN CALCIUM 20 MG PO TABS
20.0000 mg | ORAL_TABLET | Freq: Every day | ORAL | Status: DC
  Administered 2018-09-18 – 2018-09-19 (×2): 20 mg via ORAL
  Filled 2018-09-17 (×3): qty 1

## 2018-09-17 MED ORDER — ENOXAPARIN SODIUM 40 MG/0.4ML ~~LOC~~ SOLN
40.0000 mg | SUBCUTANEOUS | Status: DC
  Administered 2018-09-17 – 2018-09-19 (×3): 40 mg via SUBCUTANEOUS
  Filled 2018-09-17 (×3): qty 0.4

## 2018-09-17 MED ORDER — CALCIUM CARBONATE ANTACID 500 MG PO CHEW
1250.0000 mg | CHEWABLE_TABLET | Freq: Two times a day (BID) | ORAL | Status: DC
  Administered 2018-09-19 – 2018-09-20 (×3): 1250 mg via ORAL
  Filled 2018-09-17 (×2): qty 2.5
  Filled 2018-09-17 (×2): qty 7
  Filled 2018-09-17: qty 2.5
  Filled 2018-09-17 (×2): qty 7

## 2018-09-17 MED ORDER — VITAMIN D 25 MCG (1000 UNIT) PO TABS
2000.0000 [IU] | ORAL_TABLET | Freq: Every day | ORAL | Status: DC
  Administered 2018-09-19 – 2018-09-20 (×2): 2000 [IU] via ORAL
  Filled 2018-09-17 (×2): qty 2

## 2018-09-17 MED ORDER — GUAIFENESIN 100 MG/5ML PO SOLN
200.0000 mg | Freq: Four times a day (QID) | ORAL | Status: DC | PRN
  Administered 2018-09-17 – 2018-09-19 (×2): 200 mg via ORAL
  Filled 2018-09-17 (×3): qty 10

## 2018-09-17 MED ORDER — ACETAMINOPHEN 650 MG RE SUPP: 650.0000 mg | RECTAL | Status: DC | PRN

## 2018-09-17 MED ORDER — FERROUS SULFATE 325 (65 FE) MG PO TABS
325.0000 mg | ORAL_TABLET | Freq: Every day | ORAL | Status: DC
  Administered 2018-09-19 – 2018-09-20 (×2): 325 mg via ORAL
  Filled 2018-09-17 (×2): qty 1

## 2018-09-17 MED ORDER — VITAMIN B-12 1000 MCG PO TABS
500.0000 ug | ORAL_TABLET | Freq: Every day | ORAL | Status: DC
  Administered 2018-09-19 – 2018-09-20 (×2): 500 ug via ORAL
  Filled 2018-09-17 (×2): qty 1

## 2018-09-17 MED ORDER — ASPIRIN 81 MG PO CHEW
324.0000 mg | CHEWABLE_TABLET | Freq: Once | ORAL | Status: AC
  Administered 2018-09-17: 324 mg via ORAL

## 2018-09-17 MED ORDER — ACETAMINOPHEN 500 MG PO TABS
500.0000 mg | ORAL_TABLET | Freq: Two times a day (BID) | ORAL | Status: DC
  Administered 2018-09-18 – 2018-09-20 (×4): 500 mg via ORAL
  Filled 2018-09-17 (×5): qty 1

## 2018-09-17 MED ORDER — LISINOPRIL 20 MG PO TABS
40.0000 mg | ORAL_TABLET | Freq: Every day | ORAL | Status: DC
  Administered 2018-09-19 – 2018-09-20 (×2): 40 mg via ORAL
  Filled 2018-09-17 (×2): qty 2

## 2018-09-17 MED ORDER — RISPERIDONE 0.5 MG PO TABS
0.5000 mg | ORAL_TABLET | Freq: Two times a day (BID) | ORAL | Status: DC
  Administered 2018-09-18 – 2018-09-19 (×3): 0.5 mg via ORAL
  Filled 2018-09-17 (×8): qty 1

## 2018-09-17 MED ORDER — SODIUM CHLORIDE 0.9 % IV SOLN
INTRAVENOUS | Status: DC
  Administered 2018-09-17 – 2018-09-19 (×3): via INTRAVENOUS

## 2018-09-17 MED ORDER — LORAZEPAM 2 MG/ML IJ SOLN
2.0000 mg | Freq: Once | INTRAMUSCULAR | Status: AC
  Administered 2018-09-17: 18:00:00 2 mg via INTRAVENOUS
  Filled 2018-09-17: qty 1

## 2018-09-17 MED ORDER — SODIUM CHLORIDE 0.9% FLUSH
3.0000 mL | Freq: Two times a day (BID) | INTRAVENOUS | Status: DC
  Administered 2018-09-17 – 2018-09-20 (×6): 3 mL via INTRAVENOUS

## 2018-09-17 MED ORDER — POLYETHYLENE GLYCOL 3350 17 G PO PACK
17.0000 g | PACK | Freq: Every day | ORAL | Status: DC
  Administered 2018-09-20: 10:00:00 17 g via ORAL
  Filled 2018-09-17: qty 1

## 2018-09-17 MED ORDER — SODIUM CHLORIDE 0.9 % IV SOLN: 250.0000 mL | INTRAVENOUS | Status: DC | PRN

## 2018-09-17 MED ORDER — MEMANTINE HCL 5 MG PO TABS
10.0000 mg | ORAL_TABLET | Freq: Two times a day (BID) | ORAL | Status: DC
  Administered 2018-09-18 – 2018-09-20 (×4): 10 mg via ORAL
  Filled 2018-09-17 (×5): qty 2

## 2018-09-17 MED ORDER — CLONIDINE HCL 0.1 MG PO TABS
0.1000 mg | ORAL_TABLET | Freq: Two times a day (BID) | ORAL | Status: DC
  Administered 2018-09-17 – 2018-09-20 (×5): 0.1 mg via ORAL
  Filled 2018-09-17 (×5): qty 1

## 2018-09-17 MED ORDER — ASPIRIN 81 MG PO CHEW
81.0000 mg | CHEWABLE_TABLET | Freq: Every day | ORAL | Status: DC
  Administered 2018-09-18 – 2018-09-20 (×3): 81 mg via ORAL
  Filled 2018-09-17 (×3): qty 1

## 2018-09-17 MED ORDER — LEVOTHYROXINE SODIUM 112 MCG PO TABS
112.0000 ug | ORAL_TABLET | Freq: Every day | ORAL | Status: DC
  Administered 2018-09-19 – 2018-09-20 (×2): 112 ug via ORAL
  Filled 2018-09-17 (×3): qty 1

## 2018-09-17 NOTE — ED Notes (Signed)
Report attempted at this time. RN will monitor. 

## 2018-09-17 NOTE — H&P (Signed)
[facility] [place] NAME: Cathy Johnson    MR#:  [id number]  DATE OF BIRTH:  1928-04-12  DATE OF ADMISSION:  09/17/2018  PRIMARY CARE PHYSICIAN: [facility]   REQUESTING/REFERRING PHYSICIAN: Dr Alfred Levins  CHIEF COMPLAINT:   Altered level of consciousness. Patient is brought from [facility] no family in the room. Patient has severe dementia unable to give any history review of systems. information obtained from ER physician HISTORY OF PRESENT ILLNESS:  Cathy Johnson  is a 84 y.o. female with a known history of recent right thalamic stroke in October 2019, diabetes, severe dementia, hypertension comes to the emergency room from nursing home after she was found to have altered level of consciousness when she was at the breakfast table. Nursing staff tried to wake her up for 30 minutes patient remains lethargic sent to the emergency room  CAT scan of the head negative for acute stroke or bleed patient is awake and alert however quite confused and mumbling few words.  Moves all extremities well spontaneously patient is being admitted with altered mental status rule out CVA  PAST MEDICAL HISTORY:   Past Medical History:  Diagnosis Date  . Arthritis   . Dementia ([place])   . Hypertension   . Osteoarthritis of right knee 05/02/2014  . PFO (patent foramen ovale)    PFO by bubble study 02/05/11 TEE  . Stroke [facility])     PAST SURGICAL HISTOIRY:   Past Surgical History:  Procedure Laterality Date  . BREAST SURGERY     LT BREAST MASS REMOVED  . CARPAL TUNNEL RELEASE    . KNEE ARTHROSCOPY     RIGHT  . PARTIAL KNEE ARTHROPLASTY Right 05/02/2014   Procedure: UNICOMPARTMENTAL KNEE;  Surgeon: Johnny Bridge, MD;  Location: [place];  Service: Orthopedics;  Laterality: Right;    SOCIAL HISTORY:   Social History   Tobacco Use  . Smoking status: Never Smoker  . Smokeless tobacco: Never Used  Substance Use  Topics  . Alcohol use: No    FAMILY HISTORY:   Family History  Problem Relation Age of Onset  . Hypertension Mother   . Hypertension Father     DRUG ALLERGIES:   Allergies  Allergen Reactions  . Boniva [Ibandronic Acid] Other (See Comments)    "Allergic," per MAR  . Crestor [Rosuvastatin Calcium] Other (See Comments)    "Allergic," per MAR  . Exelon [Rivastigmine Tartrate] Other (See Comments)    "Allergic," per MAR  . Fosamax [Alendronate Sodium] Other (See Comments)    "Allergic," per MAR  . Galantamine Other (See Comments)    "Allergic," per MAR  . Lipitor [Atorvastatin] Other (See Comments)    "Allergic," per MAR  . Morphine And Related Other (See Comments)    "Allergic," per MAR  . Other Other (See Comments)    "Sedatives" and "Multiple, unknown B/P meds" = "Allergic," per MAR  . Vagifem [Estradiol] Other (See Comments)    "Allergic," per MAR    REVIEW OF SYSTEMS:  Review of Systems  Unable to perform ROS: Dementia     MEDICATIONS AT HOME:   Prior to Admission medications   Medication Sig Start Date End Date Taking? Authorizing Provider  acetaminophen (TYLENOL) 500 MG tablet Take 1,000 mg by mouth 2 (two) times daily.   Yes [provider]  aspirin 81 MG chewable tablet Chew 81 mg by mouth daily.   Yes [provider]  atorvastatin (LIPITOR)  20 MG tablet Take 20 mg by mouth at bedtime.    Yes [provider]  bisacodyl (FLEET) 10 MG/30ML ENEM Place 10 mg rectally as needed (for constipation/if no relief or abdominal pain, CALL MD).    Yes [provider]  calcium carbonate (CALCI-CHEW) 1250 (500 Ca) MG chewable tablet Chew 1 tablet by mouth 2 (two) times daily.   Yes [provider]  Cholecalciferol (VITAMIN D3) 2000 units TABS Take 2,000 Units by mouth daily.   Yes [provider]  cloNIDine (CATAPRES) 0.1 MG tablet Take 0.1 mg by mouth 2 (two) times daily.   Yes [provider]  clopidogrel  (PLAVIX) 75 MG tablet Take 1 tablet (75 mg total) by mouth daily. 03/09/17  Yes Gladstone Lighter, MD  ferrous sulfate 325 (65 FE) MG tablet Take 325 mg by mouth daily with breakfast.   Yes [provider]  guaifenesin (ROBITUSSIN) 100 MG/5ML syrup Take 200 mg by mouth every 6 (six) hours as needed (for 3 days, for coughing and notify MD if coughs persists for more than 3 days).    Yes [provider]  levothyroxine (SYNTHROID, LEVOTHROID) 112 MCG tablet Take 112 mcg by mouth daily before breakfast.   Yes [provider]  lisinopril (PRINIVIL,ZESTRIL) 40 MG tablet Take 40 mg by mouth daily.   Yes [provider]  loperamide (IMODIUM A-D) 2 MG tablet Take 2 mg by mouth See admin instructions. Take 2 mg by mouth after each loose stool as needed/max humber of doses, up to 3/12 hours and notify MD if no relief after 24 hrs   Yes [provider]  LORazepam (ATIVAN) 1 MG tablet Take 1 mg by mouth 2 (two) times daily.   Yes [provider]  memantine (NAMENDA) 10 MG tablet Take 10 mg by mouth 2 (two) times daily.   Yes [provider]  polyethylene glycol (MIRALAX / GLYCOLAX) packet Take 17 g by mouth daily.   Yes [provider]  risperiDONE (RISPERDAL) 0.5 MG tablet Take 0.5 mg by mouth See admin instructions. Take 0.5 mg by mouth two times a day- at 2 PM and 8 PM   Yes [provider]  sertraline (ZOLOFT) 50 MG tablet Take 75 mg by mouth daily.    Yes [provider]  telmisartan (MICARDIS) 80 MG tablet Take 80 mg by mouth daily.   Yes [provider]  vitamin B-12 (CYANOCOBALAMIN) 500 MCG tablet Take 500 mcg by mouth daily.    Yes [provider]  diclofenac sodium (VOLTAREN) 1 % GEL Apply 4 g topically See admin instructions. Apply 4 grams to both knees two times a day and an additional 4 grams to both knees two times a day as needed for arthritis pain    [provider]      VITAL  SIGNS:  Blood pressure (!) 190/77, pulse 70, temperature 98.1 F (36.7 C), temperature source Oral, resp. rate 19, weight 73.8 kg, SpO2 97 %.  PHYSICAL EXAMINATION:  GENERAL:  84 y.o.-year-old patient lying in the bed with no acute distress.  EYES: Pupils equal, round, reactive to light and accommodation. No scleral icterus. Extraocular muscles intact.  HEENT: Head atraumatic, normocephalic. Oropharynx and nasopharynx clear.  NECK:  Supple, no jugular venous distention. No thyroid enlargement, no tenderness.  LUNGS: Normal breath sounds bilaterally, no wheezing, rales,rhonchi or crepitation. No use of accessory muscles of respiration.  CARDIOVASCULAR: S1, S2 normal. No murmurs, rubs, or gallops.  ABDOMEN: Soft, nontender,  nondistended. Bowel sounds present. No organomegaly or mass.  EXTREMITIES: No pedal edema, cyanosis, or clubbing.  NEUROLOGIC: limited exam secondary dementia. Patient is mumbling few words. Not sure what the baseline is. Moves all extremities well.  PSYCHIATRIC: The patient is alert -- hence dementia SKIN: No obvious rash, lesion, or ulcer.   LABORATORY PANEL:   CBC Recent Labs  Lab 09/17/18 0924  WBC 8.2  HGB 13.3  HCT 41.2  PLT 270   ------------------------------------------------------------------------------------------------------------------  Chemistries  Recent Labs  Lab 09/17/18 0924  NA 142  K 5.3*  CL 109  CO2 23  GLUCOSE 104*  BUN 24*  CREATININE 0.88  CALCIUM 9.0  AST 25  ALT 13  ALKPHOS 112  BILITOT 1.1   ------------------------------------------------------------------------------------------------------------------  Cardiac Enzymes Recent Labs  Lab 09/17/18 0924  TROPONINI <0.03   ------------------------------------------------------------------------------------------------------------------  RADIOLOGY:  Ct Head Wo Contrast  Result Date: 09/17/2018 CLINICAL DATA:  Lethargy/altered mental status EXAM: CT HEAD WITHOUT  CONTRAST TECHNIQUE: Contiguous axial images were obtained from the base of the skull through the vertex without intravenous contrast. COMPARISON:  Head CT and brain MRI August 05, 2018 FINDINGS: Brain: Moderate diffuse atrophy is stable. There is no intracranial mass, hemorrhage, extra-axial fluid collection, or midline shift. There is patchy small vessel disease in the centra semiovale bilaterally. There is evidence of a prior infarct in the left frontal lobe at the gray-white junction, stable. There are prior lacunar type infarcts in the thalamic regions as well as in the right caudate nucleus head and anterior level left external capsule regions. There is a prior infarct in the posterior mid right cerebellar hemisphere. No acute infarct is evident. Vascular: No hyperdense vessels are evident. There is calcification in each carotid siphon region. Skull: Bony calvarium appears intact. Sinuses/Orbits: There is mucosal thickening in several ethmoid air cells. Visualized orbits appear symmetric bilaterally. Other: Visualized mastoid air cells are clear. IMPRESSION: Stable atrophy with prior infarcts in the supratentorial and infratentorial regions as noted. Small vessel disease in the periventricular white matter noted. No acute infarct evident. No mass or hemorrhage. There are foci of arterial vascular calcification. There is mucosal thickening in several ethmoid air cells. Electronically Signed   By: Lowella Grip III M.D.   On: 09/17/2018 09:50    EKG:    IMPRESSION AND PLAN:   Cathy Johnson  is a 84 y.o. female with a known history of recent right thalamic stroke in October 2019, diabetes, severe dementia, hypertension comes to the emergency room from nursing home after she was found to have altered level of consciousness when she was at the breakfast table. Nursing staff tried to wake her up for 30 minutes patient remains lethargic sent to the emergency room  1. altered level of consciousness  rule out acute CVA -CT had negative for CVA or bleed -MRI today pending -patient had recent right thalamic stroke noted on MRI on 1018 2019-- she was admitted at [facility]. Had full neuro- workup done for stroke -echo EF 50 to 55%. No source of emboli noted I will not repeat echo -neurology consultation with Dr. Irish Elders. Notified -continue aspirin Plavix -physical therapy  2. advanced dementia  3. Hypothyroidism continue Synthroid  4. Hypertension continue home meds  Patient care is a out of facility DNR form  No family in the room. ER physician spoke with patient's son who lives in [place]  All the records are reviewed and case discussed with ED provider.  CODE STATUS: DNR  TOTAL TIME TAKING  CARE OF THIS PATIENT: 50 minutes.    Fritzi Mandes M.D on 09/17/2018 at 1:57 PM  Between 7am to 6pm - Pager - [phone]  After 6pm go to [email or web address] - password [email or web address] [facility]  [facility]  Office  [phone]  CC: Primary care physician; [facility]

## 2018-09-17 NOTE — ED Triage Notes (Signed)
Pt presents from [facility] care at [facility]. EMS reports tahth pt was sitting at table and went to sleep . Facitlity reported to EMS they could not wake her for 30 mins then decided to call EMS. Pt is lethargic EDP at bedside at this time

## 2018-09-17 NOTE — Progress Notes (Signed)
Pt more alert, anxious/sitting up in bed/pulling IV lines, pulling off covers; constant activation of bed alarm. Comfort care, toileted, repositions, room temp addressed. Placed in low bed for safety. Safety sitter order obtained.

## 2018-09-17 NOTE — ED Notes (Signed)
Patient transported to MRI 

## 2018-09-17 NOTE — ED Provider Notes (Addendum)
[facility] Emergency Department Provider Note  ____________________________________________  Time seen: Approximately 9:25 AM  I have reviewed the triage vital signs and the nursing notes.   HISTORY  Chief Complaint Altered Mental Status   Level 5 caveat:  Portions of the history and physical were unable to be obtained due to dementia and encephalopathy  HPI Cathy Johnson is a 84 y.o. female with a history of dementia, hypertension, recently diagnosed stroke on Plavix with no deficits who presents for altered mental status.  According to the facility patient was sitting at the breakfast table when all of a sudden she became sleepy and difficult to arouse.  911 was called.  Patient's vitals and blood glucose were all normal.  Patient arrives unable to provide any history, mumbling incoherent words, not following commands.   Past Medical History:  Diagnosis Date  . Arthritis   . Dementia ([place])   . Hypertension   . Osteoarthritis of right knee 05/02/2014  . PFO (patent foramen ovale)    PFO by bubble study 02/05/11 TEE  . Stroke [facility])     Patient Active Problem List   Diagnosis Date Noted  . Elevated alkaline phosphatase level 08/06/2018  . Hypothyroidism 08/06/2018  . AMS (altered mental status) 08/05/2018  . Acute CVA (cerebrovascular accident) ([place]) 08/05/2018  . Altered mental status 07/21/2018  . Facial droop 03/06/2017  . AKI (acute kidney injury) ([place]) 01/06/2016  . Syncope 01/06/2016  . Dementia ([place]) 01/06/2016  . Hypertension 01/06/2016  . Anemia 01/06/2016  . Osteoarthritis of right knee 05/02/2014  . Knee osteoarthritis 05/02/2014    Past Surgical History:  Procedure Laterality Date  . BREAST SURGERY     LT BREAST MASS REMOVED  . CARPAL TUNNEL RELEASE    . KNEE ARTHROSCOPY     RIGHT  . PARTIAL KNEE ARTHROPLASTY Right 05/02/2014   Procedure: UNICOMPARTMENTAL KNEE;  Surgeon: Johnny Bridge, MD;  Location: [facility];  Service:  Orthopedics;  Laterality: Right;    Prior to Admission medications   Medication Sig Start Date End Date Taking? Authorizing Provider  acetaminophen (TYLENOL) 500 MG tablet Take 1,000 mg by mouth 2 (two) times daily.   Yes [provider]  aspirin 81 MG chewable tablet Chew 81 mg by mouth daily.   Yes [provider]  atorvastatin (LIPITOR) 20 MG tablet Take 20 mg by mouth at bedtime.    Yes [provider]  bisacodyl (FLEET) 10 MG/30ML ENEM Place 10 mg rectally as needed (for constipation/if no relief or abdominal pain, CALL MD).    Yes [provider]  calcium carbonate (CALCI-CHEW) 1250 (500 Ca) MG chewable tablet Chew 1 tablet by mouth 2 (two) times daily.   Yes [provider]  Cholecalciferol (VITAMIN D3) 2000 units TABS Take 2,000 Units by mouth daily.   Yes [provider]  cloNIDine (CATAPRES) 0.1 MG tablet Take 0.1 mg by mouth 2 (two) times daily.   Yes [provider]  clopidogrel (PLAVIX) 75 MG tablet Take 1 tablet (75 mg total) by mouth daily. 03/09/17  Yes Gladstone Lighter, MD  ferrous sulfate 325 (65 FE) MG tablet Take 325 mg by mouth daily with breakfast.   Yes [provider]  guaifenesin (ROBITUSSIN) 100 MG/5ML syrup Take 200 mg by mouth every 6 (six) hours as needed (for 3 days, for coughing and notify MD if coughs persists for more than 3 days).    Yes [provider]  levothyroxine ([place], [place]) 112  MCG tablet Take 112 mcg by mouth daily before breakfast.   Yes [provider]  lisinopril (PRINIVIL,ZESTRIL) 40 MG tablet Take 40 mg by mouth daily.   Yes [provider]  loperamide (IMODIUM A-D) 2 MG tablet Take 2 mg by mouth See admin instructions. Take 2 mg by mouth after each loose stool as needed/max humber of doses, up to 3/12 hours and notify MD if no relief after 24 hrs   Yes [provider]  LORazepam (ATIVAN) 1 MG tablet Take 1 mg by mouth 2 (two) times  daily.   Yes [provider]  memantine (NAMENDA) 10 MG tablet Take 10 mg by mouth 2 (two) times daily.   Yes [provider]  polyethylene glycol (MIRALAX / GLYCOLAX) packet Take 17 g by mouth daily.   Yes [provider]  risperiDONE (RISPERDAL) 0.5 MG tablet Take 0.5 mg by mouth See admin instructions. Take 0.5 mg by mouth two times a day- at 2 PM and 8 PM   Yes [provider]  sertraline (ZOLOFT) 50 MG tablet Take 75 mg by mouth daily.    Yes [provider]  telmisartan (MICARDIS) 80 MG tablet Take 80 mg by mouth daily.   Yes [provider]  vitamin B-12 (CYANOCOBALAMIN) 500 MCG tablet Take 500 mcg by mouth daily.    Yes [provider]  diclofenac sodium (VOLTAREN) 1 % GEL Apply 4 g topically See admin instructions. Apply 4 grams to both knees two times a day and an additional 4 grams to both knees two times a day as needed for arthritis pain    [provider]    Allergies Boniva [ibandronic acid]; Crestor [rosuvastatin calcium]; Exelon [rivastigmine tartrate]; Fosamax [alendronate sodium]; Galantamine; Lipitor [atorvastatin]; Morphine and related; Other; and Vagifem [estradiol]  Family History  Problem Relation Age of Onset  . Hypertension Mother   . Hypertension Father     Social History Social History   Tobacco Use  . Smoking status: Never Smoker  . Smokeless tobacco: Never Used  Substance Use Topics  . Alcohol use: No  . Drug use: No    Review of Systems  Constitutional: Negative for fever. + confusion ____________________________________________   PHYSICAL EXAM:  VITAL SIGNS: ED Triage Vitals [09/17/18 0922]  Enc Vitals Group     BP (!) 166/91     Pulse Rate 71     Resp 19     Temp 98.1 F (36.7 C)     Temp Source Oral     SpO2 98 %     Weight 162 lb 11.2 oz (73.8 kg)     Height      Head Circumference      Peak Flow      Pain Score 0     Pain Loc      Pain Edu?      Excl. in  [place]?     Constitutional: Awake, looks at me, will not answer questions, no distress. HEENT:      Head: Normocephalic and atraumatic.         Eyes: Conjunctivae are normal. Sclera is non-icteric.       Mouth/Throat: Mucous membranes are dry.       Neck: Supple with no signs of meningismus. Cardiovascular: Regular rate and rhythm. No murmurs, gallops, or rubs. 2+ symmetrical distal pulses are present in all extremities. No JVD. Respiratory: Normal respiratory effort. Lungs are clear to auscultation bilaterally. No wheezes, crackles, or rhonchi.  Gastrointestinal:  Soft, non tender, and non distended with positive bowel sounds. No rebound or guarding. Musculoskeletal:  No edema, cyanosis, or erythema of extremities. Neurologic: Face is symmetric, will not follow commands but fights me when I try to move all extremities, mumbling when I ask her questions  Skin: Skin is warm, dry and intact. No rash noted.  ____________________________________________   LABS (all labs ordered are listed, but only abnormal results are displayed)  Labs Reviewed  COMPREHENSIVE METABOLIC PANEL - Abnormal; Notable for the following components:      Result Value   Potassium 5.3 (*)    Glucose, Bld 104 (*)    BUN 24 (*)    GFR calc non Af Amer 59 (*)    All other components within normal limits  URINALYSIS, COMPLETE (UACMP) WITH MICROSCOPIC - Abnormal; Notable for the following components:   Color, Urine YELLOW (*)    APPearance CLEAR (*)    All other components within normal limits  MRSA PCR SCREENING  CBC WITH DIFFERENTIAL/PLATELET  TROPONIN I  CG4 I-STAT (LACTIC ACID)   ____________________________________________  EKG  ED ECG REPORT I, Rudene Re, the attending physician, personally viewed and interpreted this ECG.  Normal sinus rhythm, rate of 78, normal intervals, normal axis, low voltage QRS, no ST elevations or depressions.  No significant changes when compared to prior    ____________________________________________  RADIOLOGY  I have personally reviewed the images performed during this visit and I agree with the Radiologist's read.   Interpretation by Radiologist:  Ct Head Wo Contrast  Result Date: 09/17/2018 CLINICAL DATA:  Lethargy/altered mental status EXAM: CT HEAD WITHOUT CONTRAST TECHNIQUE: Contiguous axial images were obtained from the base of the skull through the vertex without intravenous contrast. COMPARISON:  Head CT and brain MRI August 05, 2018 FINDINGS: Brain: Moderate diffuse atrophy is stable. There is no intracranial mass, hemorrhage, extra-axial fluid collection, or midline shift. There is patchy small vessel disease in the centra semiovale bilaterally. There is evidence of a prior infarct in the left frontal lobe at the gray-white junction, stable. There are prior lacunar type infarcts in the thalamic regions as well as in the right caudate nucleus head and anterior level left external capsule regions. There is a prior infarct in the posterior mid right cerebellar hemisphere. No acute infarct is evident. Vascular: No hyperdense vessels are evident. There is calcification in each carotid siphon region. Skull: Bony calvarium appears intact. Sinuses/Orbits: There is mucosal thickening in several ethmoid air cells. Visualized orbits appear symmetric bilaterally. Other: Visualized mastoid air cells are clear. IMPRESSION: Stable atrophy with prior infarcts in the supratentorial and infratentorial regions as noted. Small vessel disease in the periventricular white matter noted. No acute infarct evident. No mass or hemorrhage. There are foci of arterial vascular calcification. There is mucosal thickening in several ethmoid air cells. Electronically Signed   By: Lowella Grip III M.D.   On: 09/17/2018 09:50   Mr Brain Wo Contrast  Result Date: 09/17/2018 CLINICAL DATA:  Speech difficulty. EXAM: MRI HEAD WITHOUT CONTRAST TECHNIQUE: Multiplanar,  multiecho pulse sequences of the brain and surrounding structures were obtained without intravenous contrast. COMPARISON:  MRI 08/05/2018 FINDINGS: Brain: Image quality degraded by motion.  Abbreviated sequences. Probable small area of acute infarct in the left paramedian midbrain. Previous acute infarct in the left medial thalamus resolved on diffusion. Moderate atrophy most prominent in the temporal lobes. Chronic ischemic changes in the white matter. Chronic infarct in the right thalamus. Negative for hemorrhage or mass Vascular:  Normal arterial flow void Skull and upper cervical spine: Negative Sinuses/Orbits: Paranasal sinuses clear.  Bilateral cataract surgery Other: None IMPRESSION: Small area of restricted diffusion left paramedian midbrain most compatible with acute infarct. Atrophy and chronic ischemic change. Electronically Signed   By: Franchot Gallo M.D.   On: 09/17/2018 14:33   US Carotid Bilateral (at [facility] Only)  Result Date: 09/17/2018 CLINICAL DATA:  Cerebral infarction, hypertension, hyperlipidemia and diabetes. EXAM: BILATERAL CAROTID DUPLEX ULTRASOUND TECHNIQUE: Pearline Cables scale imaging, color Doppler and duplex ultrasound were performed of bilateral carotid and vertebral arteries in the neck. COMPARISON:  None. FINDINGS: Criteria: Quantification of carotid stenosis is based on velocity parameters that correlate the residual internal carotid diameter with NASCET-based stenosis levels, using the diameter of the distal internal carotid lumen as the denominator for stenosis measurement. The following velocity measurements were obtained: RIGHT ICA:  74/15 cm/sec CCA:  35/32 cm/sec SYSTOLIC ICA/CCA RATIO:  1.5 ECA:  82 cm/sec LEFT ICA:  63/13 cm/sec CCA:  99/24 cm/sec SYSTOLIC ICA/CCA RATIO:  1.0 ECA:  77 cm/sec RIGHT CAROTID ARTERY: Mild amount of partially calcified plaque is present at the level of the right carotid bulb and proximal right ICA. Estimated right ICA stenosis is less than 50%  RIGHT VERTEBRAL ARTERY: Antegrade flow with normal waveform and velocity. LEFT CAROTID ARTERY: There is a mild amount calcified plaque at the level of the carotid bulb and ICA origin. Estimated left ICA stenosis is less than 50%. LEFT VERTEBRAL ARTERY: Antegrade flow with normal waveform and velocity. IMPRESSION: Mild amount of plaque at the level of both carotid bulbs and proximal internal carotid arteries. No significant carotid stenosis is identified with estimated bilateral ICA stenoses of less than 50%. Electronically Signed   By: Aletta Edouard M.D.   On: 09/17/2018 16:12      ____________________________________________   PROCEDURES  Procedure(s) performed: None Procedures Critical Care performed:  Yes  CRITICAL CARE Performed by: Rudene Re  ?  Total critical care time: 35 min  Critical care time was exclusive of separately billable procedures and treating other patients.  Critical care was necessary to treat or prevent imminent or life-threatening deterioration.  Critical care was time spent personally by me on the following activities: development of treatment plan with patient and/or surrogate as well as nursing, discussions with consultants, evaluation of patient's response to treatment, examination of patient, obtaining history from patient or surrogate, ordering and performing treatments and interventions, ordering and review of laboratory studies, ordering and review of radiographic studies, pulse oximetry and re-evaluation of patient's condition.  ____________________________________________   INITIAL IMPRESSION / ASSESSMENT AND PLAN / ED COURSE   84 y.o. female with a history of dementia, hypertension, recently diagnosed stroke on Plavix with no deficits who presents for altered mental status.  Patient arrives hemodynamically stable, normal blood glucose, GCS of 10, no obvious localizing neurological deficits.  Seems similar to recent presentation a month ago  when patient was diagnosed with a thalamic stroke.  She is currently on Plavix.  We will send her for CT head to rule out hemorrhagic stroke.  Will check basic labs and monitor on telemetry for any other possible etiologies including dehydration, anemia, electrolyte abnormalities, UTI, cardiac dysrhythmias    _________________________ 11:56 AM on 09/17/2018 -----------------------------------------  Labs, EKG, and head CT with no acute abnormalities.  Patient is slightly more awake and interactive however very aphasic and unable to say any words that actually makes sense. I spoke with her SNF and I was  told that this is not patient's baseline. She is confused at baseline but able to answer basic questions and say her name Therefore I am concerned for a new ischemic stroke. MRI has been ordered. Will discuss with Hospitalist for admission.   _________________________ 4:46 PM on 09/17/2018 -----------------------------------------  MRI consistent with acute stroke.    As part of my medical decision making, I reviewed the following data within the [place] notes reviewed and incorporated, Labs reviewed , EKG interpreted , Old EKG reviewed, Old chart reviewed, Radiograph reviewed , Discussed with admitting physician , Notes from prior ED visits and [place] Controlled Substance Database    Pertinent labs & imaging results that were available during my care of the patient were reviewed by me and considered in my medical decision making (see chart for details).    ____________________________________________   FINAL CLINICAL IMPRESSION(S) / ED DIAGNOSES  Final diagnoses:  Aphasia  Encephalopathy      NEW MEDICATIONS STARTED DURING THIS VISIT:  ED Discharge Orders    None       Note:  This document was prepared using Dragon voice recognition software and may include unintentional dictation errors.    Alfred Levins, [place], MD 09/17/18 [place],  [place], [place] 09/17/18 [id number]

## 2018-09-17 NOTE — ED Notes (Signed)
.   Pt is resting, Respirations even and unlabored, NAD. Stretcher lowest postion and locked. Call bell within reach.  RN will continue to monitor.

## 2018-09-17 NOTE — ED Notes (Signed)
.   Pt is resting, Respirations even and unlabored, NAD. Stretcher lowest postion and locked. Call bell within reach. Denies any needs at this time RN will continue to monitor.    

## 2018-09-17 NOTE — Progress Notes (Signed)
Pt admitted from ED with stroke workup with past history of stroke. Unable to do NIH due to confusion and lethargy pt with slurred speech with only few words understandable. Information obtained from son via TC and reports has had some slurred speech and right facial droop and decreased mobility with knees giving way with pt now WC bound. Pt slight more alert, anxious, unable to verbalize/moving all 4 extremities. Low bed ordered for safety.

## 2018-09-17 NOTE — ED Notes (Signed)
.   Pt is resting, Respirations even and unlabored, NAD. Stretcher lowest postion and locked. Call bell within reach. RN will continue to monitor.

## 2018-09-17 NOTE — Progress Notes (Addendum)
Ativan 2 mg IV given as ordered with pt more calm. Personal sitter from [facility] reports this is pt's baseline in that she sits with her there due to severe dementia and previous stroke. Reports pt has had slurred, difficulty to understand speech. Pt unable to participate in neuro check.

## 2018-09-18 ENCOUNTER — Encounter: Payer: Self-pay | Admitting: Radiology

## 2018-09-18 ENCOUNTER — Observation Stay: Payer: Medicare Other

## 2018-09-18 DIAGNOSIS — Z888 Allergy status to other drugs, medicaments and biological substances status: Secondary | ICD-10-CM | POA: Diagnosis not present

## 2018-09-18 DIAGNOSIS — I10 Essential (primary) hypertension: Secondary | ICD-10-CM | POA: Diagnosis not present

## 2018-09-18 DIAGNOSIS — R4701 Aphasia: Secondary | ICD-10-CM | POA: Diagnosis present

## 2018-09-18 DIAGNOSIS — E039 Hypothyroidism, unspecified: Secondary | ICD-10-CM | POA: Diagnosis present

## 2018-09-18 DIAGNOSIS — Z79890 Hormone replacement therapy (postmenopausal): Secondary | ICD-10-CM | POA: Diagnosis not present

## 2018-09-18 DIAGNOSIS — I959 Hypotension, unspecified: Secondary | ICD-10-CM | POA: Diagnosis not present

## 2018-09-18 DIAGNOSIS — I639 Cerebral infarction, unspecified: Secondary | ICD-10-CM | POA: Diagnosis present

## 2018-09-18 DIAGNOSIS — Z79899 Other long term (current) drug therapy: Secondary | ICD-10-CM | POA: Diagnosis not present

## 2018-09-18 DIAGNOSIS — R4182 Altered mental status, unspecified: Secondary | ICD-10-CM | POA: Diagnosis not present

## 2018-09-18 DIAGNOSIS — Z791 Long term (current) use of non-steroidal anti-inflammatories (NSAID): Secondary | ICD-10-CM | POA: Diagnosis not present

## 2018-09-18 DIAGNOSIS — R402422 Glasgow coma scale score 9-12, at arrival to emergency department: Secondary | ICD-10-CM | POA: Diagnosis present

## 2018-09-18 DIAGNOSIS — G459 Transient cerebral ischemic attack, unspecified: Secondary | ICD-10-CM | POA: Diagnosis not present

## 2018-09-18 DIAGNOSIS — M2550 Pain in unspecified joint: Secondary | ICD-10-CM | POA: Diagnosis not present

## 2018-09-18 DIAGNOSIS — Z8673 Personal history of transient ischemic attack (TIA), and cerebral infarction without residual deficits: Secondary | ICD-10-CM | POA: Diagnosis not present

## 2018-09-18 DIAGNOSIS — Z7401 Bed confinement status: Secondary | ICD-10-CM | POA: Diagnosis not present

## 2018-09-18 DIAGNOSIS — R404 Transient alteration of awareness: Secondary | ICD-10-CM | POA: Diagnosis not present

## 2018-09-18 DIAGNOSIS — I63439 Cerebral infarction due to embolism of unspecified posterior cerebral artery: Secondary | ICD-10-CM

## 2018-09-18 DIAGNOSIS — Z8249 Family history of ischemic heart disease and other diseases of the circulatory system: Secondary | ICD-10-CM | POA: Diagnosis not present

## 2018-09-18 DIAGNOSIS — Z96651 Presence of right artificial knee joint: Secondary | ICD-10-CM | POA: Diagnosis present

## 2018-09-18 DIAGNOSIS — Z7982 Long term (current) use of aspirin: Secondary | ICD-10-CM | POA: Diagnosis not present

## 2018-09-18 DIAGNOSIS — F0390 Unspecified dementia without behavioral disturbance: Secondary | ICD-10-CM | POA: Diagnosis present

## 2018-09-18 DIAGNOSIS — Z66 Do not resuscitate: Secondary | ICD-10-CM | POA: Diagnosis present

## 2018-09-18 DIAGNOSIS — Z7902 Long term (current) use of antithrombotics/antiplatelets: Secondary | ICD-10-CM | POA: Diagnosis not present

## 2018-09-18 LAB — LIPID PANEL
CHOL/HDL RATIO: 3.6 ratio
Cholesterol: 128 mg/dL (ref 0–200)
HDL: 36 mg/dL — ABNORMAL LOW (ref >40)
LDL Cholesterol: 73 mg/dL (ref 0–99)
Triglycerides: 93 mg/dL (ref <150)
VLDL: 19 mg/dL (ref 0–40)

## 2018-09-18 LAB — HEMOGLOBIN A1C
Hgb A1c MFr Bld: 5.2 % (ref 4.8–5.6)
Mean Plasma Glucose: 102.54 mg/dL

## 2018-09-18 MED ORDER — IOHEXOL 350 MG/ML SOLN
75.0000 mL | Freq: Once | INTRAVENOUS | Status: AC | PRN
  Administered 2018-09-18: 75 mL via INTRAVENOUS

## 2018-09-18 MED ORDER — LABETALOL HCL 5 MG/ML IV SOLN
10.0000 mg | INTRAVENOUS | Status: DC | PRN
  Administered 2018-09-18 – 2018-09-19 (×2): 10 mg via INTRAVENOUS
  Filled 2018-09-18 (×2): qty 4

## 2018-09-18 MED ORDER — HALOPERIDOL LACTATE 5 MG/ML IJ SOLN
2.5000 mg | Freq: Four times a day (QID) | INTRAMUSCULAR | Status: DC | PRN
  Administered 2018-09-18 – 2018-09-19 (×2): 2.5 mg via INTRAVENOUS
  Filled 2018-09-18 (×2): qty 1

## 2018-09-18 NOTE — Evaluation (Signed)
Clinical/Bedside Swallow Evaluation Patient Details  Name: Cathy Johnson MRN: [id number] Date of Birth: [birth year]  Today's Date: 09/18/2018 Time: SLP Start Time (ACUTE ONLY): 0930 SLP Stop Time (ACUTE ONLY): 1015 SLP Time Calculation (min) (ACUTE ONLY): 45 min  Past Medical History:  Past Medical History:  Diagnosis Date  . Arthritis   . Dementia ([place])   . Hypertension   . Osteoarthritis of right knee 05/02/2014  . PFO (patent foramen ovale)    PFO by bubble study 02/05/11 TEE  . Stroke [facility])    Past Surgical History:  Past Surgical History:  Procedure Laterality Date  . BREAST SURGERY     LT BREAST MASS REMOVED  . CARPAL TUNNEL RELEASE    . KNEE ARTHROSCOPY     RIGHT  . PARTIAL KNEE ARTHROPLASTY Right 05/02/2014   Procedure: UNICOMPARTMENTAL KNEE;  Surgeon: Johnny Bridge, MD;  Location: [facility];  Service: Orthopedics;  Laterality: Right;   HPI:  Per admitting H&P: Cathy Johnson  is a 84 y.o. female with a known history of recent right thalamic stroke in October 2019, diabetes, severe dementia, hypertension comes to the emergency room from nursing home after she was found to have altered level of consciousness when she was at the breakfast table.   Assessment / Plan / Recommendation Clinical Impression  Patient presents with mod oropharyngeal dysphagia. Oral phase c/b mod oral prep and coordination deficits with nectar thick liquids and solids. Little attempts were made to hold and propel nectar thick liquids by tsp posteriorly, tsp's largely observed to move back and over base of tongue on their own, greatly increasing risk of aspiration due to lack of oral control. Mod oral prep difficulties, mod to severe oral transit delay, and min oral residue with solids. A-P transit time and oral prep/coordination much improved with tsp puree and small sips honey thick liquid. S/s aspiration c/b delayed coughing following 1 ice chip, likely due to decreased oral control and swallow delay.  Likely swallow delay with thin and nectar thick liquids. Patient largely unable to follow directions; however was able to intermittently follow commands to open mouth and assist SLP with oral care. Noted R sided anterior spillage of saliva intermittently throughout evaluation. Patient with mod increased risk of aspiration due to likely swallow delay with thinner consistencies and decreased oral control of both thinner consistencies and solids. Rec. Dysphagia I diet (puree) with honey thick liquids by cup, give meds crushed in puree. Strict aspiration precautions. Recommend total assist with all meals, ensuring pt has swallowed before offering next tsp/sip. Offer small sips Honey thick liquid only, monitor for s/s aspiration. SLP to f/u with toleration of diet and offer trials of upgraded consistency as appropriate. Discussed diet and swallow rec's with nsg. Nsg in agreement. SLP Visit Diagnosis: Dysphagia, oropharyngeal phase (R13.12)    Aspiration Risk  Moderate aspiration risk    Diet Recommendation Dysphagia 1 (Puree);Honey-thick liquid   Liquid Administration via: Cup Medication Administration: Crushed with puree Supervision: Full supervision/cueing for compensatory strategies Compensations: Minimize environmental distractions;Slow rate;Small sips/bites Postural Changes: Seated upright at 90 degrees;Remain upright for at least 30 minutes after po intake    Other  Recommendations Oral Care Recommendations: Oral care BID   Follow up Recommendations Skilled Nursing facility      Frequency and Duration min 4x/week  2 weeks       Prognosis Prognosis for Safe Diet Advancement: Fair Barriers to Reach Goals: Cognitive deficits Barriers/Prognosis Comment: Severe dementia, largely unable to follow directions  Swallow Study   General Date of Onset: 09/18/18 HPI: Per admitting H&P: Cathy Johnson  is a 84 y.o. female with a known history of recent right thalamic stroke in October 2019,  diabetes, severe dementia, hypertension comes to the emergency room from nursing home after she was found to have altered level of consciousness when she was at the breakfast table. Type of Study: Bedside Swallow Evaluation Diet Prior to this Study: NPO Temperature Spikes Noted: No Respiratory Status: Room air;Increased respiratory rate History of Recent Intubation: No Behavior/Cognition: Alert;Confused;Requires cueing;Doesn't follow directions Oral Cavity Assessment: Lesions;Dry Oral Care Completed by SLP: Yes Oral Cavity - Dentition: Adequate natural dentition Vision: Impaired for self-feeding Self-Feeding Abilities: Total assist Patient Positioning: Upright in bed Baseline Vocal Quality: Low vocal intensity Volitional Cough: Weak;Congested Volitional Swallow: Unable to elicit    Oral/Motor/Sensory Function     Ice Chips Ice chips: Impaired Presentation: Spoon Oral Phase Impairments: Reduced labial seal;Reduced lingual movement/coordination Oral Phase Functional Implications: Right anterior spillage Pharyngeal Phase Impairments: Cough - Delayed   Thin Liquid Thin Liquid: Not tested    Nectar Thick Nectar Thick Liquid: Impaired Presentation: Spoon Oral Phase Impairments: Reduced labial seal;Reduced lingual movement/coordination Pharyngeal Phase Impairments: Suspected delayed Swallow   Honey Thick Honey Thick Liquid: Impaired Presentation: Cup;Spoon Oral Phase Impairments: Reduced lingual movement/coordination   Puree Puree: Within functional limits Presentation: Spoon   Solid     Solid: Impaired Presentation: Spoon Oral Phase Impairments: Reduced lingual movement/coordination;Impaired mastication Oral Phase Functional Implications: Prolonged oral transit;Impaired mastication      Wallie Lagrand, MA, CCC-SLP 09/18/2018,10:19 AM

## 2018-09-18 NOTE — Progress Notes (Signed)
SLP Cancellation Note  Patient Details Name: Cathy Johnson MRN: [id number] DOB: May 18, 1928   Cancelled treatment:       Reason Eval/Treat Not Completed: Fatigue/lethargy limiting ability to participate;Patient's level of consciousness; Chart reviewed, Attempted to wake patient with repeated verbal and tactile cues. Noted congested cough. Patient mumbled a few undistinguishable responses without opening eyes. Unable to arouse patient with cool washcloth, to clean face. Unable to arouse patient for trial PO's to complete Bedside Swallow evaluation. Discussed with nursing, nursing to notify SLP if patient becomes more alert for re-attempt of BSE. Will re-attempt later today if alert enough to complete evaluation.   Kylei Purington, MA, CCC-SLP 09/18/2018, 9:06 AM

## 2018-09-18 NOTE — Progress Notes (Signed)
PT Cancellation Note  Patient Details Name: Cathy Johnson MRN: [id number] DOB: 07-Oct-1928   Cancelled Treatment:    Reason Eval/Treat Not Completed: Patient's level of consciousness.  Order received.  Chart reviewed.  Pt unable to participate in therapy due to level of consciousness and difficulty in following commands.  Will re-attempt when pt is appropriate.   Roxanne Gates, PT, DPT 09/18/2018, 1:57 PM

## 2018-09-18 NOTE — NC FL2 (Signed)
[place] LEVEL OF CARE SCREENING TOOL     IDENTIFICATION  Patient Name: Cathy Johnson Birthdate: 1928/04/12 Sex: female Admission Date (Current Location): 09/17/2018  [facility] and [place] Number:  Engineering geologist and Address:  [facility], [address] [place], [place] [address]      Provider Number: [id number]  Attending Physician Name and Address:  Henreitta Leber, MD  Relative Name and Phone Number:       Current Level of Care: Hospital Recommended Level of Care: Memory Care Prior Approval Number:    Date Approved/Denied:   PASRR Number:    Discharge Plan: Domiciliary (Rest home)    Current Diagnoses: Patient Active Problem List   Diagnosis Date Noted  . Elevated alkaline phosphatase level 08/06/2018  . Hypothyroidism 08/06/2018  . AMS (altered mental status) 08/05/2018  . Acute CVA (cerebrovascular accident) ([place]) 08/05/2018  . Altered mental status 07/21/2018  . Facial droop 03/06/2017  . AKI (acute kidney injury) ([place]) 01/06/2016  . Syncope 01/06/2016  . Dementia ([place]) 01/06/2016  . Hypertension 01/06/2016  . Anemia 01/06/2016  . Osteoarthritis of right knee 05/02/2014  . Knee osteoarthritis 05/02/2014    Orientation RESPIRATION BLADDER Height & Weight     Self    Incontinent Weight: 160 lb 9.6 oz (72.8 kg) Height:  5\' 2"  (157.5 cm)  BEHAVIORAL SYMPTOMS/MOOD NEUROLOGICAL BOWEL NUTRITION STATUS      Incontinent    AMBULATORY STATUS COMMUNICATION OF NEEDS Skin   Limited Assist Non-Verbally                         Personal Care Assistance Level of Assistance  Bathing, Feeding, Dressing, Total care Bathing Assistance: Limited assistance Feeding assistance: Independent Dressing Assistance: Limited assistance     Functional Limitations Info  Sight, Hearing, Speech Sight Info: Adequate Hearing Info: Adequate Speech Info: Impaired    SPECIAL CARE FACTORS FREQUENCY                        Contractures Contractures Info: Not present    Additional Factors Info  Code Status, Allergies Code Status Info: DNR Allergies Info: Boniva Ibandronic Acid, Crestor Rosuvastatin Calcium, Exelon Rivastigmine Tartrate, Fosamax Alendronate Sodium, Galantamine, Lipitor Atorvastatin, Morphine And Related, Other, Vagifem Estradiol           Current Medications (09/18/2018):  This is the current hospital active medication list Current Facility-Administered Medications  Medication Dose Route Frequency Provider Last Rate Last Dose  . 0.9 %  sodium chloride infusion  250 mL Intravenous PRN Fritzi Mandes, MD      . 0.9 %  sodium chloride infusion   Intravenous Continuous Henreitta Leber, MD   Stopped at 09/18/18 1507  . acetaminophen (TYLENOL) tablet 650 mg  650 mg Oral Q4H PRN Fritzi Mandes, MD       Or  . acetaminophen (TYLENOL) solution 650 mg  650 mg Per Tube Q4H PRN Fritzi Mandes, MD       Or  . acetaminophen (TYLENOL) suppository 650 mg  650 mg Rectal Q4H PRN Fritzi Mandes, MD      . acetaminophen (TYLENOL) tablet 500 mg  500 mg Oral BID Fritzi Mandes, MD      . aspirin chewable tablet 81 mg  81 mg Oral Daily Fritzi Mandes, MD      . atorvastatin (LIPITOR) tablet 20 mg  20 mg Oral QHS Fritzi Mandes, MD      .  calcium carbonate (TUMS - dosed in mg elemental calcium) chewable tablet 1,250 mg  1,250 mg Oral BID Fritzi Mandes, MD      . cholecalciferol (VITAMIN D3) tablet 2,000 Units  2,000 Units Oral Daily Fritzi Mandes, MD      . cloNIDine (CATAPRES) tablet 0.1 mg  0.1 mg Oral BID Fritzi Mandes, MD   0.1 mg at 09/17/18 2143  . clopidogrel (PLAVIX) tablet 75 mg  75 mg Oral Daily Fritzi Mandes, MD      . enoxaparin (LOVENOX) injection 40 mg  40 mg Subcutaneous Q24H Fritzi Mandes, MD   40 mg at 09/17/18 2143  . ferrous sulfate tablet 325 mg  325 mg Oral Q breakfast Fritzi Mandes, MD      . guaiFENesin (ROBITUSSIN) 100 MG/5ML solution 200 mg  200 mg Oral Q6H PRN Fritzi Mandes, MD   200 mg at 09/17/18 2153  .  irbesartan (AVAPRO) tablet 75 mg  75 mg Oral Daily Fritzi Mandes, MD      . levothyroxine (SYNTHROID, LEVOTHROID) tablet 112 mcg  112 mcg Oral Q0600 Fritzi Mandes, MD      . lisinopril (PRINIVIL,ZESTRIL) tablet 40 mg  40 mg Oral Daily Fritzi Mandes, MD      . memantine [facility]) tablet 10 mg  10 mg Oral BID Fritzi Mandes, MD      . polyethylene glycol (MIRALAX / GLYCOLAX) packet 17 g  17 g Oral Daily Fritzi Mandes, MD      . risperiDONE (RISPERDAL) tablet 0.5 mg  0.5 mg Oral q12n4p Fritzi Mandes, MD   Stopped at 09/17/18 1508  . sertraline (ZOLOFT) tablet 75 mg  75 mg Oral Daily Fritzi Mandes, MD      . sodium chloride flush (NS) 0.9 % injection 3 mL  3 mL Intravenous Q12H Fritzi Mandes, MD   3 mL at 09/17/18 2144  . sodium chloride flush (NS) 0.9 % injection 3 mL  3 mL Intravenous PRN Fritzi Mandes, MD      . vitamin B-12 (CYANOCOBALAMIN) tablet 500 mcg  500 mcg Oral Daily Fritzi Mandes, MD         Discharge Medications: Please see discharge summary for a list of discharge medications.  Relevant Imaging Results:  Relevant Lab Results:   Additional Information SS#: [id number]  Joana Reamer, [place]

## 2018-09-18 NOTE — Care Management Obs Status (Signed)
[place] NOTIFICATION   Patient Details  Name: Cathy Johnson MRN: [id number] Date of Birth: 1928/04/10   Medicare Observation Status Notification Given:  Yes    Tamla Winkels A Conor Filsaime, RN 09/18/2018, 12:33 PM

## 2018-09-18 NOTE — Progress Notes (Signed)
OT Cancellation Note  Patient Details Name: ZETHA KUHAR MRN: [id number] DOB: 15-Feb-1928   Cancelled Treatment:     Patient was lethargic this am and unable to arouse earlier in the morning.  Returned later and patient was more alert however was unable to follow commands, no family present to provide history, appears agitated now and unable to perform evaluation.  . Will continue attempts.   Amy T Lovett, OTR/L, CLT    Lovett,Amy 09/18/2018, 11:12 AM

## 2018-09-18 NOTE — Progress Notes (Signed)
84 y.o. female with a known history of recent right thalamic stroke in October 2019, diabetes, severe dementia, hypertension comes to the emergency room from nursing home after she was found to have altered level of consciousness when she was at the breakfast table. Nursing staff tried to wake her up for 30 minutes patient remains lethargic sent to the emergency room  Pt has history of thalamic and now a midbrain infarct likely posterior circulation. Obtain CTA h/n and follow  Leotis Pain

## 2018-09-18 NOTE — Progress Notes (Signed)
[facility] [place] NAME: Cathy Johnson    MR#:  [id number]  DATE OF BIRTH:  05-02-1928  SUBJECTIVE:   Patient presented to the hospital due to worsening altered mental status and suspected to have a stroke.  Patient's MRI of the brain confirms a midbrain stroke.  Patient has underlying dementia and therefore is a poor historian.  No other acute events overnight.  REVIEW OF SYSTEMS:    Review of Systems  Unable to perform ROS: Dementia    Nutrition: Dysphagia 1 with honey thick liquids Tolerating Diet: Yes Tolerating PT: Await Eval.   DRUG ALLERGIES:   Allergies  Allergen Reactions  . Boniva [Ibandronic Acid] Other (See Comments)    "Allergic," per MAR  . Crestor [Rosuvastatin Calcium] Other (See Comments)    "Allergic," per MAR  . Exelon [Rivastigmine Tartrate] Other (See Comments)    "Allergic," per MAR  . Fosamax [Alendronate Sodium] Other (See Comments)    "Allergic," per MAR  . Galantamine Other (See Comments)    "Allergic," per MAR  . Lipitor [Atorvastatin] Other (See Comments)    "Allergic," per MAR  . Morphine And Related Other (See Comments)    "Allergic," per MAR  . Other Other (See Comments)    "Sedatives" and "Multiple, unknown B/P meds" = "Allergic," per MAR  . Vagifem [Estradiol] Other (See Comments)    "Allergic," per MAR    VITALS:  Blood pressure (!) 162/81, pulse 78, temperature 98.7 F (37.1 C), temperature source Oral, resp. rate 19, height 5\' 2"  (1.575 m), weight 72.8 kg, SpO2 94 %.  PHYSICAL EXAMINATION:   Physical Exam  GENERAL:  84 y.o.-year-old patient lying in bed lethargic but in NAD.  Follows some commands.  EYES: Pupils equal, round, reactive to light and accommodation. No scleral icterus. Extraocular muscles intact.  HEENT: Head atraumatic, normocephalic. Oropharynx and nasopharynx clear.  NECK:  Supple, no jugular venous distention. No thyroid enlargement, no tenderness.  LUNGS:  Normal breath sounds bilaterally, no wheezing, rales, rhonchi. No use of accessory muscles of respiration.  CARDIOVASCULAR: S1, S2 normal. No murmurs, rubs, or gallops.  ABDOMEN: Soft, nontender, nondistended. Bowel sounds present. No organomegaly or mass.  EXTREMITIES: No cyanosis, clubbing or edema b/l.    NEUROLOGIC: Cranial nerves II through XII are intact. No focal Motor or sensory deficits b/l. Globally weak  PSYCHIATRIC: The patient is alert and oriented x 1.  SKIN: No obvious rash, lesion, or ulcer.    LABORATORY PANEL:   CBC Recent Labs  Lab 09/17/18 0924  WBC 8.2  HGB 13.3  HCT 41.2  PLT 270   ------------------------------------------------------------------------------------------------------------------  Chemistries  Recent Labs  Lab 09/17/18 0924  NA 142  K 5.3*  CL 109  CO2 23  GLUCOSE 104*  BUN 24*  CREATININE 0.88  CALCIUM 9.0  AST 25  ALT 13  ALKPHOS 112  BILITOT 1.1   ------------------------------------------------------------------------------------------------------------------  Cardiac Enzymes Recent Labs  Lab 09/17/18 0924  TROPONINI <0.03   ------------------------------------------------------------------------------------------------------------------  RADIOLOGY:  Ct Head Wo Contrast  Result Date: 09/17/2018 CLINICAL DATA:  Lethargy/altered mental status EXAM: CT HEAD WITHOUT CONTRAST TECHNIQUE: Contiguous axial images were obtained from the base of the skull through the vertex without intravenous contrast. COMPARISON:  Head CT and brain MRI August 05, 2018 FINDINGS: Brain: Moderate diffuse atrophy is stable. There is no intracranial mass, hemorrhage, extra-axial fluid collection, or midline shift. There is patchy small vessel disease in the centra semiovale bilaterally.  There is evidence of a prior infarct in the left frontal lobe at the gray-white junction, stable. There are prior lacunar type infarcts in the thalamic regions as well  as in the right caudate nucleus head and anterior level left external capsule regions. There is a prior infarct in the posterior mid right cerebellar hemisphere. No acute infarct is evident. Vascular: No hyperdense vessels are evident. There is calcification in each carotid siphon region. Skull: Bony calvarium appears intact. Sinuses/Orbits: There is mucosal thickening in several ethmoid air cells. Visualized orbits appear symmetric bilaterally. Other: Visualized mastoid air cells are clear. IMPRESSION: Stable atrophy with prior infarcts in the supratentorial and infratentorial regions as noted. Small vessel disease in the periventricular white matter noted. No acute infarct evident. No mass or hemorrhage. There are foci of arterial vascular calcification. There is mucosal thickening in several ethmoid air cells. Electronically Signed   By: Lowella Grip III M.D.   On: 09/17/2018 09:50   Mr Brain Wo Contrast  Result Date: 09/17/2018 CLINICAL DATA:  Speech difficulty. EXAM: MRI HEAD WITHOUT CONTRAST TECHNIQUE: Multiplanar, multiecho pulse sequences of the brain and surrounding structures were obtained without intravenous contrast. COMPARISON:  MRI 08/05/2018 FINDINGS: Brain: Image quality degraded by motion.  Abbreviated sequences. Probable small area of acute infarct in the left paramedian midbrain. Previous acute infarct in the left medial thalamus resolved on diffusion. Moderate atrophy most prominent in the temporal lobes. Chronic ischemic changes in the white matter. Chronic infarct in the right thalamus. Negative for hemorrhage or mass Vascular: Normal arterial flow void Skull and upper cervical spine: Negative Sinuses/Orbits: Paranasal sinuses clear.  Bilateral cataract surgery Other: None IMPRESSION: Small area of restricted diffusion left paramedian midbrain most compatible with acute infarct. Atrophy and chronic ischemic change. Electronically Signed   By: Franchot Gallo M.D.   On: 09/17/2018 14:33    US Carotid Bilateral (at [facility] And [facility] Only)  Result Date: 09/17/2018 CLINICAL DATA:  Cerebral infarction, hypertension, hyperlipidemia and diabetes. EXAM: BILATERAL CAROTID DUPLEX ULTRASOUND TECHNIQUE: Pearline Cables scale imaging, color Doppler and duplex ultrasound were performed of bilateral carotid and vertebral arteries in the neck. COMPARISON:  None. FINDINGS: Criteria: Quantification of carotid stenosis is based on velocity parameters that correlate the residual internal carotid diameter with NASCET-based stenosis levels, using the diameter of the distal internal carotid lumen as the denominator for stenosis measurement. The following velocity measurements were obtained: RIGHT ICA:  74/15 cm/sec CCA:  37/16 cm/sec SYSTOLIC ICA/CCA RATIO:  1.5 ECA:  82 cm/sec LEFT ICA:  63/13 cm/sec CCA:  96/78 cm/sec SYSTOLIC ICA/CCA RATIO:  1.0 ECA:  77 cm/sec RIGHT CAROTID ARTERY: Mild amount of partially calcified plaque is present at the level of the right carotid bulb and proximal right ICA. Estimated right ICA stenosis is less than 50% RIGHT VERTEBRAL ARTERY: Antegrade flow with normal waveform and velocity. LEFT CAROTID ARTERY: There is a mild amount calcified plaque at the level of the carotid bulb and ICA origin. Estimated left ICA stenosis is less than 50%. LEFT VERTEBRAL ARTERY: Antegrade flow with normal waveform and velocity. IMPRESSION: Mild amount of plaque at the level of both carotid bulbs and proximal internal carotid arteries. No significant carotid stenosis is identified with estimated bilateral ICA stenoses of less than 50%. Electronically Signed   By: Aletta Edouard M.D.   On: 09/17/2018 16:12     ASSESSMENT AND PLAN:   84 year old female with past medical history of previous CVA, dementia, osteoarthritis, hypertension who presents to the hospital due to  altered mental status.  1.  Altered mental status-secondary to underlying dementia complicated with possible acute stroke. - Patient presented to  the hospital with worsening mental status and underwent an MRI of the brain which is positive for midbrain stroke. - Continue aspirin, Plavix, statin.  Follow mental status which is improving.  2.  Acute CVA-this is a source of patient's worsening altered mental status. - Continue aspirin, Plavix, statin.  Await further neurology input.   -Carotid duplex negative for hemodynamically significant carotid stenosis, although discussed with neurology and since patient has a posterior circulation stroke they would prefer getting a CTA, therefore CT of the head neck has been ordered.  Next line-await PT, OT evaluation.  Seen by speech and started on a dysphagia 1 diet with honey thick liquids.  3.  Essential hypertension-continue lisinopril, clonidine, Avapro.   4.  Dementia-continue Namenda, Risperdal  5.  Hypothyroidism-continue Synthroid.   Discussed plan of care with pt's son over the phone and also with neurology  All the records are reviewed and case discussed with Care Management/Social Worker. Management plans discussed with the patient, family and they are in agreement.  CODE STATUS: DNR  DVT Prophylaxis: Lovenox  TOTAL TIME TAKING CARE OF THIS PATIENT: 30 minutes.   POSSIBLE D/C IN 1-2 DAYS, DEPENDING ON CLINICAL CONDITION.   Henreitta Leber M.D on 09/18/2018 at 1:56 PM  Between 7am to 6pm - Pager - [phone]  After 6pm go to [email or web address] - [employer] [facility]  Office  [phone]  CC: Primary care physician; [facility]

## 2018-09-18 NOTE — Clinical Social Work Note (Signed)
Clinical Social Work Assessment  Patient Details  Name: Cathy Johnson MRN: [id number] Date of Birth: 02-18-1928  Date of referral:  09/18/18               Reason for consult:  Other (Comment Required)(From [facility])                Permission sought to share information with:  Family Supports, Chartered certified accountant granted to share information::  Yes, Verbal Permission Granted  Name::     Son Kirbi Farrugia- [phone]  Agency::  [facility]  Relationship::     Contact Information:     Housing/Transportation Living arrangements for the past 2 months:  Assisted Living Facility([facility]) Source of Information:  Adult Children, Facility, Medical Team Patient Interpreter Needed:  None Criminal Activity/Legal Involvement Pertinent to Current Situation/Hospitalization:  No - Comment as needed Significant Relationships:  Adult Children Lives with:  Facility Resident Do you feel safe going back to the place where you live?  Yes Need for family participation in patient care:  Yes (Comment)  Care giving concerns: Son was informed by LCSW medical floor that his Mom was admitted. She is to return to [employer] / plan: LCSW introduced myself to patient who was attempting to reach out to get out of her bed. She was non verbal and this worker was not able to understand patient ( talks gibberish) garbled sounds. LCSW spoke with patients private attendant and collected information as follows  She needs full assistance with bathing and dressing. She can pivot and assist in transfers and sits in her wheel chair for most of the day. She did have PT and OT but the have not been able to direct patient on the days they were present. She has 2 sons listed on her face sheet. Patient is able to eat soft diet at [facility]. The family would like patient to return to [facility]. They have a sitter 4 hours a day 4x week.Patient is oriented x1. (  Memory care unit at [facility]) She does not use 02 ,and is not diabetic or on dialysis.   Employment status:  Retired Forensic scientist:  (S) Medicare(TRI Care) PT Recommendations:  No Follow Up(PT  and OT 3x week) Information / Referral to community resources:     Patient/Family's Response to care: Please call [name] ( son)  Patient/Family's Understanding of and Emotional Response to Diagnosis, Current Treatment, and Prognosis: ( Family has a good understanding of her current condition and supports in place)  Emotional Assessment Appearance:  Appears stated age Attitude/Demeanor/Rapport:  (S) Lethargic, Unable to Assess(Dementia patient-Non verbal at times) Affect (typically observed):  Restless Orientation:  Oriented to Self Alcohol / Substance use:  Not Applicable Psych involvement (Current and /or in the community):  No (Comment)  Discharge Needs  Concerns to be addressed:  No discharge needs identified Readmission within the last 30 days:  No Current discharge risk:  None Barriers to Discharge:  No Barriers Identified   Joana Reamer, LCSW 09/18/2018, 3:07 PM

## 2018-09-18 NOTE — Progress Notes (Signed)
Pt calm throughout the night.  Pt unable to take medication without coughing.  Did not give all medication last night and did not give synthroid this AM as pt is not alert enough to take.  Discontinued sitter due to pt unable to get out of bed and has been calm this shift. Dorna Bloom RN

## 2018-09-19 DIAGNOSIS — R4701 Aphasia: Secondary | ICD-10-CM

## 2018-09-19 LAB — BASIC METABOLIC PANEL
Anion gap: 8 (ref 5–15)
BUN: 17 mg/dL (ref 8–23)
CO2: 26 mmol/L (ref 22–32)
Calcium: 8.8 mg/dL — ABNORMAL LOW (ref 8.9–10.3)
Chloride: 108 mmol/L (ref 98–111)
Creatinine, Ser: 0.76 mg/dL (ref 0.44–1.00)
GFR calc Af Amer: >60 mL/min (ref >60)
GFR calc non Af Amer: >60 mL/min (ref >60)
GLUCOSE: 109 mg/dL — AB (ref 70–99)
POTASSIUM: 3.6 mmol/L (ref 3.5–5.1)
SODIUM: 142 mmol/L (ref 135–145)

## 2018-09-19 NOTE — Evaluation (Addendum)
Physical Therapy Evaluation Patient Details Name: Cathy Johnson MRN: [id number] DOB: February 09, 1928 Today's Date: 09/19/2018   History of Present Illness  Patient is an 84 year old female admitted from [place] with a midbrain stroke after becoming unresponsive.  PMH includes thalamic stroke (Oct 2019), PFO, arthritis of R knee, Htn and dementia.  Clinical Impression  Patient is an 84 year old female who lives in ALF and is unable to provide medical hx due to dementia and aphasia.  Pt very determined to communicate and is able to respond intermittently.  Pt required min-max A for bed mobility, able to follow directions to assist with each transfer.  Pt able to sit at bedside without assistance and followed directions for initial MMT, losing focus after knee flexion testing.  She presented with fair-good strength of LE's.  Pt unable to follow directions to perform coordination testing.  She required mod A for STS, having the strength to rise but presenting with posterior lean against bed and not advancing forward when asked to.  Pt will continue to benefit from skilled PT with focus on strength, safe functional mobility and balance.  Pt is appropriate for [facility] PT provided she is receiving the ADL care needed from her ALF.    Follow Up Recommendations Home health PT;Supervision for mobility/OOB    Equipment Recommendations  Rolling walker with 5" wheels(Pt needs a RW if she does not already have one.)    Recommendations for Other Services       Precautions / Restrictions Precautions Precautions: Fall Restrictions Weight Bearing Restrictions: No      Mobility  Bed Mobility Overal bed mobility: Needs Assistance Bed Mobility: Supine to Sit;Sit to Supine     Supine to sit: Min assist Sit to supine: Max assist   General bed mobility comments: Pt able to get to EOB with min difficulty and hand held assist.  INcreased time to scoot to EOB due to pt cognitive state.  Pt required more  assistance to get back in bed.  Communicated with PT nonverbally that she wanted to try to do it herself but fell back onto the bed before reaching her pillow.  Pt able to use LE's to assist PT to scoot her up in bed.  Transfers Overall transfer level: Needs assistance Equipment used: Rolling walker (2 wheeled) Transfers: Sit to/from Stand Sit to Stand: Mod assist         General transfer comment: Able to stand very slowly but remains leaning posteriorly against bed.  Unable to step forward safely.  Ambulation/Gait Ambulation/Gait assistance: (Unsafe at this time.)              Stairs            Wheelchair Mobility    Modified Rankin (Stroke Patients Only)       Balance Overall balance assessment: Needs assistance Sitting-balance support: Bilateral upper extremity supported;Feet supported Sitting balance-Leahy Scale: Fair   Postural control: Posterior lean Standing balance support: Bilateral upper extremity supported Standing balance-Leahy Scale: Poor                               Pertinent Vitals/Pain Pain Assessment: No/denies pain    Home Living Family/patient expects to be discharged to:: Unsure                      Prior Function Level of Independence: Needs assistance  Comments: Patient unable to provide hx but information provided by care management states that pt has appropriate resources at ALF to care for her.     Hand Dominance        Extremity/Trunk Assessment   Upper Extremity Assessment Upper Extremity Assessment: Generalized weakness    Lower Extremity Assessment Lower Extremity Assessment: Overall WFL for tasks assessed(Pt able to follow commands for MMT: B LE ankles: 4/5, knee flexion: 4-/5; unable to follow commands to complete remainder of testing.)    Cervical / Trunk Assessment Cervical / Trunk Assessment: Normal  Communication   Communication: Expressive difficulties(Aphasia)  Cognition  Arousal/Alertness: Awake/alert Behavior During Therapy: WFL for tasks assessed/performed Overall Cognitive Status: History of cognitive impairments - at baseline                                 General Comments: Hx of dementia.  Pt puts forth a lot of effort to communicate and can sometimes respond with verbal yes or no, or shake her head.      General Comments      Exercises Other Exercises Other Exercises: Followed directions for ankle pumps and SLR x10 bilaterally and then unable to respond to further ther ex.  x4 min Other Exercises: Education for body mechanics during bed mobility. x4 min   Assessment/Plan    PT Assessment Patient needs continued PT services  PT Problem List Decreased strength;Decreased mobility;Decreased balance;Decreased knowledge of use of DME;Decreased activity tolerance;Decreased cognition       PT Treatment Interventions DME instruction;Therapeutic activities;Functional mobility training;Balance training;Therapeutic exercise;Gait training;Patient/family education    PT Goals (Current goals can be found in the Care Plan section)  Acute Rehab PT Goals PT Goal Formulation: Patient unable to participate in goal setting    Frequency 7X/week   Barriers to discharge        Co-evaluation               AM-PAC PT "6 Clicks" Mobility  Outcome Measure Help needed turning from your back to your side while in a flat bed without using bedrails?: A Little Help needed moving from lying on your back to sitting on the side of a flat bed without using bedrails?: A Little Help needed moving to and from a bed to a chair (including a wheelchair)?: A Lot Help needed standing up from a chair using your arms (e.g., wheelchair or bedside chair)?: A Lot Help needed to walk in hospital room?: A Lot Help needed climbing 3-5 steps with a railing? : Total 6 Click Score: 13    End of Session Equipment Utilized During Treatment: Gait belt Activity  Tolerance: Patient tolerated treatment well Patient left: in bed;with bed alarm set;with call bell/phone within reach Nurse Communication: Mobility status PT Visit Diagnosis: Unsteadiness on feet (R26.81);Muscle weakness (generalized) (M62.81)    Time: 1543-1600 PT Time Calculation (min) (ACUTE ONLY): 17 min   Charges:   PT Evaluation $PT Eval Low Complexity: 1 Low PT Treatments $Therapeutic Activity: 8-22 mins        Roxanne Gates, PT, DPT  Roxanne Gates 09/19/2018, 4:53 PM, Addendum 09/19/2018, [id number]

## 2018-09-19 NOTE — Progress Notes (Signed)
[place] [facility] [place] NAME: Pairlee Sawtell    MR#:  [id number]  DATE OF BIRTH:  12/21/1927  SUBJECTIVE:   A bit lethargic and sleepy this morning.  Follows simple commands.  Seen by speech and started on a pured diet.  REVIEW OF SYSTEMS:    Review of Systems  Unable to perform ROS: Dementia    Nutrition: Dysphagia 1 with honey thick liquids Tolerating Diet: Yes Tolerating PT: Await Eval.   DRUG ALLERGIES:   Allergies  Allergen Reactions  . Boniva [Ibandronic Acid] Other (See Comments)    "Allergic," per MAR  . Crestor [Rosuvastatin Calcium] Other (See Comments)    "Allergic," per MAR  . Exelon [Rivastigmine Tartrate] Other (See Comments)    "Allergic," per MAR  . Fosamax [Alendronate Sodium] Other (See Comments)    "Allergic," per MAR  . Galantamine Other (See Comments)    "Allergic," per MAR  . Lipitor [Atorvastatin] Other (See Comments)    "Allergic," per MAR  . Morphine And Related Other (See Comments)    "Allergic," per MAR  . Other Other (See Comments)    "Sedatives" and "Multiple, unknown B/P meds" = "Allergic," per MAR  . Vagifem [Estradiol] Other (See Comments)    "Allergic," per MAR    VITALS:  Blood pressure (!) 165/84, pulse 82, temperature 97.8 F (36.6 C), temperature source Oral, resp. rate 20, height 5\' 2"  (1.575 m), weight 72.8 kg, SpO2 96 %.  PHYSICAL EXAMINATION:   Physical Exam  GENERAL:  84 y.o.-year-old patient lying in bed lethargic but in NAD.  Follows some commands.  EYES: Pupils equal, round, reactive to light and accommodation. No scleral icterus. Extraocular muscles intact.  HEENT: Head atraumatic, normocephalic. Oropharynx and nasopharynx clear.  NECK:  Supple, no jugular venous distention. No thyroid enlargement, no tenderness.  LUNGS: Poor resp. effort, no wheezing, rales, rhonchi. No use of accessory muscles of respiration.  CARDIOVASCULAR: S1, S2 normal. No murmurs, rubs, or  gallops.  ABDOMEN: Soft, nontender, nondistended. Bowel sounds present. No organomegaly or mass.  EXTREMITIES: No cyanosis, clubbing or edema b/l.    NEUROLOGIC: Cranial nerves II through XII are intact. No focal Motor or sensory deficits b/l. Globally weak  PSYCHIATRIC: The patient is alert and oriented x 1.  SKIN: No obvious rash, lesion, or ulcer.    LABORATORY PANEL:   CBC Recent Labs  Lab 09/17/18 0924  WBC 8.2  HGB 13.3  HCT 41.2  PLT 270   ------------------------------------------------------------------------------------------------------------------  Chemistries  Recent Labs  Lab 09/17/18 0924 09/19/18 0444  NA 142 142  K 5.3* 3.6  CL 109 108  CO2 23 26  GLUCOSE 104* 109*  BUN 24* 17  CREATININE 0.88 0.76  CALCIUM 9.0 8.8*  AST 25  --   ALT 13  --   ALKPHOS 112  --   BILITOT 1.1  --    ------------------------------------------------------------------------------------------------------------------  Cardiac Enzymes Recent Labs  Lab 09/17/18 0924  TROPONINI <0.03   ------------------------------------------------------------------------------------------------------------------  RADIOLOGY:  Ct Angio Head W Or Wo Contrast  Result Date: 09/18/2018 CLINICAL DATA:  Stroke EXAM: CT ANGIOGRAPHY HEAD AND NECK TECHNIQUE: Multidetector CT imaging of the head and neck was performed using the standard protocol during bolus administration of intravenous contrast. Multiplanar CT image reconstructions and MIPs were obtained to evaluate the vascular anatomy. Carotid stenosis measurements (when applicable) are obtained utilizing NASCET criteria, using the distal internal carotid diameter as the denominator. CONTRAST:  37mL OMNIPAQUE IOHEXOL  350 MG/ML SOLN COMPARISON:  MRI head 09/17/2018 FINDINGS: CT HEAD FINDINGS Brain: Small areas of acute infarct in the left midbrain are best seen on MRI. Moderate atrophy and moderate chronic ischemic change. Negative for hemorrhage  or mass. Vascular: Negative for hyperdense vessel Skull: Negative Sinuses: Paranasal sinuses clear. Orbits: Bilateral cataract surgery Review of the MIP images confirms the above findings CTA NECK FINDINGS Aortic arch: Mild atherosclerotic disease in the aortic arch. Origin of left carotid from the innominate. Great vessels patent without significant stenosis. Right carotid system: Right carotid system widely patent. Mild atherosclerotic disease at the bifurcation Left carotid system: Left carotid system widely patent with mild atherosclerotic disease at the bifurcation Vertebral arteries: Both vertebral arteries are patent without significant stenosis. Skeleton: No acute abnormality.  Cervical spine degenerative change. Other neck: Enlargement of the left lobe of the thyroid compatible with goiter measuring 22 x 31 mm Upper chest: Negative Review of the MIP images confirms the above findings CTA HEAD FINDINGS Anterior circulation: Atherosclerotic disease in the cavernous carotid bilaterally without stenosis. Anterior and middle cerebral arteries patent bilaterally without stenosis or aneurysm Posterior circulation: Both vertebral arteries patent to the basilar. Basilar widely patent. PICA patent bilaterally. AICA, superior cerebellar, and posterior cerebral arteries patent bilaterally. Fetal origin of the posterior cerebral artery bilaterally with hypoplastic distal basilar. Venous sinuses: Normal enhancement Anatomic variants: None Delayed phase: No enhancing lesions on delayed imaging Review of the MIP images confirms the above findings IMPRESSION: Carotid artery and vertebral artery widely patent in the neck Negative for emergent large vessel occlusion. No significant intracranial stenosis. Moderate atrophy and moderate chronic microvascular ischemia. Small left midbrain infarct best seen on MRI. Electronically Signed   By: Franchot Gallo M.D.   On: 09/18/2018 16:05   Ct Angio Neck W Or Wo Contrast  Result  Date: 09/18/2018 CLINICAL DATA:  Stroke EXAM: CT ANGIOGRAPHY HEAD AND NECK TECHNIQUE: Multidetector CT imaging of the head and neck was performed using the standard protocol during bolus administration of intravenous contrast. Multiplanar CT image reconstructions and MIPs were obtained to evaluate the vascular anatomy. Carotid stenosis measurements (when applicable) are obtained utilizing NASCET criteria, using the distal internal carotid diameter as the denominator. CONTRAST:  55mL OMNIPAQUE IOHEXOL 350 MG/ML SOLN COMPARISON:  MRI head 09/17/2018 FINDINGS: CT HEAD FINDINGS Brain: Small areas of acute infarct in the left midbrain are best seen on MRI. Moderate atrophy and moderate chronic ischemic change. Negative for hemorrhage or mass. Vascular: Negative for hyperdense vessel Skull: Negative Sinuses: Paranasal sinuses clear. Orbits: Bilateral cataract surgery Review of the MIP images confirms the above findings CTA NECK FINDINGS Aortic arch: Mild atherosclerotic disease in the aortic arch. Origin of left carotid from the innominate. Great vessels patent without significant stenosis. Right carotid system: Right carotid system widely patent. Mild atherosclerotic disease at the bifurcation Left carotid system: Left carotid system widely patent with mild atherosclerotic disease at the bifurcation Vertebral arteries: Both vertebral arteries are patent without significant stenosis. Skeleton: No acute abnormality.  Cervical spine degenerative change. Other neck: Enlargement of the left lobe of the thyroid compatible with goiter measuring 22 x 31 mm Upper chest: Negative Review of the MIP images confirms the above findings CTA HEAD FINDINGS Anterior circulation: Atherosclerotic disease in the cavernous carotid bilaterally without stenosis. Anterior and middle cerebral arteries patent bilaterally without stenosis or aneurysm Posterior circulation: Both vertebral arteries patent to the basilar. Basilar widely patent. PICA  patent bilaterally. AICA, superior cerebellar, and posterior cerebral arteries patent bilaterally.  Fetal origin of the posterior cerebral artery bilaterally with hypoplastic distal basilar. Venous sinuses: Normal enhancement Anatomic variants: None Delayed phase: No enhancing lesions on delayed imaging Review of the MIP images confirms the above findings IMPRESSION: Carotid artery and vertebral artery widely patent in the neck Negative for emergent large vessel occlusion. No significant intracranial stenosis. Moderate atrophy and moderate chronic microvascular ischemia. Small left midbrain infarct best seen on MRI. Electronically Signed   By: Franchot Gallo M.D.   On: 09/18/2018 16:05   Mr Brain Wo Contrast  Result Date: 09/17/2018 CLINICAL DATA:  Speech difficulty. EXAM: MRI HEAD WITHOUT CONTRAST TECHNIQUE: Multiplanar, multiecho pulse sequences of the brain and surrounding structures were obtained without intravenous contrast. COMPARISON:  MRI 08/05/2018 FINDINGS: Brain: Image quality degraded by motion.  Abbreviated sequences. Probable small area of acute infarct in the left paramedian midbrain. Previous acute infarct in the left medial thalamus resolved on diffusion. Moderate atrophy most prominent in the temporal lobes. Chronic ischemic changes in the white matter. Chronic infarct in the right thalamus. Negative for hemorrhage or mass Vascular: Normal arterial flow void Skull and upper cervical spine: Negative Sinuses/Orbits: Paranasal sinuses clear.  Bilateral cataract surgery Other: None IMPRESSION: Small area of restricted diffusion left paramedian midbrain most compatible with acute infarct. Atrophy and chronic ischemic change. Electronically Signed   By: Franchot Gallo M.D.   On: 09/17/2018 14:33   US Carotid Bilateral (at [facility] And [facility] Only)  Result Date: 09/17/2018 CLINICAL DATA:  Cerebral infarction, hypertension, hyperlipidemia and diabetes. EXAM: BILATERAL CAROTID DUPLEX ULTRASOUND TECHNIQUE:  Pearline Cables scale imaging, color Doppler and duplex ultrasound were performed of bilateral carotid and vertebral arteries in the neck. COMPARISON:  None. FINDINGS: Criteria: Quantification of carotid stenosis is based on velocity parameters that correlate the residual internal carotid diameter with NASCET-based stenosis levels, using the diameter of the distal internal carotid lumen as the denominator for stenosis measurement. The following velocity measurements were obtained: RIGHT ICA:  74/15 cm/sec CCA:  06/26 cm/sec SYSTOLIC ICA/CCA RATIO:  1.5 ECA:  82 cm/sec LEFT ICA:  63/13 cm/sec CCA:  94/85 cm/sec SYSTOLIC ICA/CCA RATIO:  1.0 ECA:  77 cm/sec RIGHT CAROTID ARTERY: Mild amount of partially calcified plaque is present at the level of the right carotid bulb and proximal right ICA. Estimated right ICA stenosis is less than 50% RIGHT VERTEBRAL ARTERY: Antegrade flow with normal waveform and velocity. LEFT CAROTID ARTERY: There is a mild amount calcified plaque at the level of the carotid bulb and ICA origin. Estimated left ICA stenosis is less than 50%. LEFT VERTEBRAL ARTERY: Antegrade flow with normal waveform and velocity. IMPRESSION: Mild amount of plaque at the level of both carotid bulbs and proximal internal carotid arteries. No significant carotid stenosis is identified with estimated bilateral ICA stenoses of less than 50%. Electronically Signed   By: Aletta Edouard M.D.   On: 09/17/2018 16:12     ASSESSMENT AND PLAN:   84 year old female with past medical history of previous CVA, dementia, osteoarthritis, hypertension who presents to the hospital due to altered mental status.  1.  Altered mental status-secondary to underlying dementia complicated acute CVA. - Patient presented to the hospital with worsening mental status and underwent an MRI of the brain which is positive for midbrain stroke. - Continue aspirin, Plavix, statin.  MS is improving.   2.  Acute CVA-this is the source of patient's  worsening altered mental status. - Continue aspirin, Plavix, statin.  She had neurological input. -CTA of the head and  neck was negative for any hemodynamically significant intracranial or carotid artery stenosis.  Patient has a posterior circulation stroke.  Continue dual antiplatelet therapy as mentioned. -Await PT/OT evaluation. - seen by speech started on dysphagia 1 diet with honey thick liquids.  Will have speech reevaluate patient tomorrow morning as patient normally not on this diet.   3.  Essential hypertension-continue lisinopril, clonidine, Avapro.   4.  Dementia-continue Namenda, Risperdal  5.  Hypothyroidism-continue Synthroid.   All the records are reviewed and case discussed with Care Management/Social Worker. Management plans discussed with the patient, family and they are in agreement.  CODE STATUS: DNR  DVT Prophylaxis: Lovenox  TOTAL TIME TAKING CARE OF THIS PATIENT: 30 minutes.   POSSIBLE D/C IN 1-2 DAYS, DEPENDING ON CLINICAL CONDITION.   Henreitta Leber M.D on 09/19/2018 at 1:52 PM  Between 7am to 6pm - Pager - [phone]  After 6pm go to [email or web address] - [employer] [facility]  Office  [phone]  CC: Primary care physician; [facility]

## 2018-09-19 NOTE — Consult Note (Signed)
Reason for Consult: AMS/stroke Referring Physician: Dr. Posey Pronto   CC:AMS  HPI: Cathy Johnson is an 84 y.o. female with a known history of recent right thalamic stroke in October 2019, diabetes, severe dementia, hypertension comes to the emergency room from nursing home after she was found to have altered level of consciousness when she was at the breakfasttable. Pt has history of thalamic and now a midbrain infarct likely posterior circulation.   Past Medical History:  Diagnosis Date  . Arthritis   . Dementia ([place])   . Hypertension   . Osteoarthritis of right knee 05/02/2014  . PFO (patent foramen ovale)    PFO by bubble study 02/05/11 TEE  . Stroke [facility])     Past Surgical History:  Procedure Laterality Date  . BREAST SURGERY     LT BREAST MASS REMOVED  . CARPAL TUNNEL RELEASE    . KNEE ARTHROSCOPY     RIGHT  . PARTIAL KNEE ARTHROPLASTY Right 05/02/2014   Procedure: UNICOMPARTMENTAL KNEE;  Surgeon: Johnny Bridge, MD;  Location: [facility];  Service: Orthopedics;  Laterality: Right;    Family History  Problem Relation Age of Onset  . Hypertension Mother   . Hypertension Father     Social History:  reports that she has never smoked. She has never used smokeless tobacco. She reports that she does not drink alcohol or use drugs.  Allergies  Allergen Reactions  . Boniva [Ibandronic Acid] Other (See Comments)    "Allergic," per MAR  . Crestor [Rosuvastatin Calcium] Other (See Comments)    "Allergic," per MAR  . Exelon [Rivastigmine Tartrate] Other (See Comments)    "Allergic," per MAR  . Fosamax [Alendronate Sodium] Other (See Comments)    "Allergic," per MAR  . Galantamine Other (See Comments)    "Allergic," per MAR  . Lipitor [Atorvastatin] Other (See Comments)    "Allergic," per MAR  . Morphine And Related Other (See Comments)    "Allergic," per MAR  . Other Other (See Comments)    "Sedatives" and "Multiple, unknown B/P meds" = "Allergic," per MAR  . Vagifem  [Estradiol] Other (See Comments)    "Allergic," per MAR    Medications: I have reviewed the patient's current medications.  ROS: Unable to obtain due to confusion   Physical Examination: Blood pressure (!) 165/84, pulse 82, temperature 97.8 F (36.6 C), temperature source Oral, resp. rate 20, height 5\' 2"  (1.575 m), weight 72.8 kg, SpO2 96 %.    Neurological Examination   Mental Status: Alert to name only, confusion, periods of aphasia unclear if baseline.   Cranial Nerves: II: Discs flat bilaterally; Visual fields grossly normal, pupils equal, round, reactive to light and accommodation III,IV, VI: ptosis not present, extra-ocular motions intact bilaterally V,VII: smile symmetric, facial light touch sensation normal bilaterally VIII: hearing normal bilaterally IX,X: gag reflex present XI: bilateral shoulder shrug XII: midline tongue extension Motor: Right : Generalized weakness  Tone and bulk:normal tone throughout; no atrophy noted       Laboratory Studies:   Basic Metabolic Panel: Recent Labs  Lab 09/17/18 0924 09/19/18 0444  NA 142 142  K 5.3* 3.6  CL 109 108  CO2 23 26  GLUCOSE 104* 109*  BUN 24* 17  CREATININE 0.88 0.76  CALCIUM 9.0 8.8*    Liver Function Tests: Recent Labs  Lab 09/17/18 0924  AST 25  ALT 13  ALKPHOS 112  BILITOT 1.1  PROT 6.9  ALBUMIN 4.0   No results for input(s): LIPASE,  AMYLASE in the last 168 hours. No results for input(s): AMMONIA in the last 168 hours.  CBC: Recent Labs  Lab 09/17/18 0924  WBC 8.2  NEUTROABS 6.0  HGB 13.3  HCT 41.2  MCV 93.4  PLT 270    Cardiac Enzymes: Recent Labs  Lab 09/17/18 0924  TROPONINI <0.03    BNP: Invalid input(s): POCBNP  CBG: No results for input(s): GLUCAP in the last 168 hours.  Microbiology: Results for orders placed or performed during the hospital encounter of 09/17/18  MRSA PCR Screening     Status: None   Collection Time: 09/17/18  4:24 PM  Result Value Ref  Range Status   MRSA by PCR NEGATIVE NEGATIVE Final    Comment:        The GeneXpert MRSA Assay (FDA approved for NASAL specimens only), is one component of a comprehensive MRSA colonization surveillance program. It is not intended to diagnose MRSA infection nor to guide or monitor treatment for MRSA infections. Performed at [facility], [address] [place], [place] [address]     Coagulation Studies: No results for input(s): LABPROT, INR in the last 72 hours.  Urinalysis:  Recent Labs  Lab 09/17/18 0924  COLORURINE YELLOW*  LABSPEC 1.023  PHURINE 5.0  GLUCOSEU NEGATIVE  HGBUR NEGATIVE  BILIRUBINUR NEGATIVE  KETONESUR NEGATIVE  PROTEINUR NEGATIVE  NITRITE NEGATIVE  LEUKOCYTESUR NEGATIVE    Lipid Panel:     Component Value Date/Time   CHOL 128 09/18/2018 0551   TRIG 93 09/18/2018 0551   HDL 36 (L) 09/18/2018 0551   CHOLHDL 3.6 09/18/2018 0551   VLDL 19 09/18/2018 0551   LDLCALC 73 09/18/2018 0551    HgbA1C:  Lab Results  Component Value Date   HGBA1C 5.2 09/18/2018    Urine Drug Screen:  No results found for: LABOPIA, COCAINSCRNUR, LABBENZ, AMPHETMU, THCU, LABBARB  Alcohol Level: No results for input(s): ETH in the last 168 hours.  Other results: EKG: normal EKG, normal sinus rhythm, unchanged from previous tracings.  Imaging: Ct Angio Head W Or Wo Contrast  Result Date: 09/18/2018 CLINICAL DATA:  Stroke EXAM: CT ANGIOGRAPHY HEAD AND NECK TECHNIQUE: Multidetector CT imaging of the head and neck was performed using the standard protocol during bolus administration of intravenous contrast. Multiplanar CT image reconstructions and MIPs were obtained to evaluate the vascular anatomy. Carotid stenosis measurements (when applicable) are obtained utilizing NASCET criteria, using the distal internal carotid diameter as the denominator. CONTRAST:  51mL OMNIPAQUE IOHEXOL 350 MG/ML SOLN COMPARISON:  MRI head 09/17/2018 FINDINGS: CT HEAD FINDINGS Brain:  Small areas of acute infarct in the left midbrain are best seen on MRI. Moderate atrophy and moderate chronic ischemic change. Negative for hemorrhage or mass. Vascular: Negative for hyperdense vessel Skull: Negative Sinuses: Paranasal sinuses clear. Orbits: Bilateral cataract surgery Review of the MIP images confirms the above findings CTA NECK FINDINGS Aortic arch: Mild atherosclerotic disease in the aortic arch. Origin of left carotid from the innominate. Great vessels patent without significant stenosis. Right carotid system: Right carotid system widely patent. Mild atherosclerotic disease at the bifurcation Left carotid system: Left carotid system widely patent with mild atherosclerotic disease at the bifurcation Vertebral arteries: Both vertebral arteries are patent without significant stenosis. Skeleton: No acute abnormality.  Cervical spine degenerative change. Other neck: Enlargement of the left lobe of the thyroid compatible with goiter measuring 22 x 31 mm Upper chest: Negative Review of the MIP images confirms the above findings CTA HEAD FINDINGS Anterior circulation: Atherosclerotic  disease in the cavernous carotid bilaterally without stenosis. Anterior and middle cerebral arteries patent bilaterally without stenosis or aneurysm Posterior circulation: Both vertebral arteries patent to the basilar. Basilar widely patent. PICA patent bilaterally. AICA, superior cerebellar, and posterior cerebral arteries patent bilaterally. Fetal origin of the posterior cerebral artery bilaterally with hypoplastic distal basilar. Venous sinuses: Normal enhancement Anatomic variants: None Delayed phase: No enhancing lesions on delayed imaging Review of the MIP images confirms the above findings IMPRESSION: Carotid artery and vertebral artery widely patent in the neck Negative for emergent large vessel occlusion. No significant intracranial stenosis. Moderate atrophy and moderate chronic microvascular ischemia. Small left  midbrain infarct best seen on MRI. Electronically Signed   By: Franchot Gallo M.D.   On: 09/18/2018 16:05   Ct Angio Neck W Or Wo Contrast  Result Date: 09/18/2018 CLINICAL DATA:  Stroke EXAM: CT ANGIOGRAPHY HEAD AND NECK TECHNIQUE: Multidetector CT imaging of the head and neck was performed using the standard protocol during bolus administration of intravenous contrast. Multiplanar CT image reconstructions and MIPs were obtained to evaluate the vascular anatomy. Carotid stenosis measurements (when applicable) are obtained utilizing NASCET criteria, using the distal internal carotid diameter as the denominator. CONTRAST:  41mL OMNIPAQUE IOHEXOL 350 MG/ML SOLN COMPARISON:  MRI head 09/17/2018 FINDINGS: CT HEAD FINDINGS Brain: Small areas of acute infarct in the left midbrain are best seen on MRI. Moderate atrophy and moderate chronic ischemic change. Negative for hemorrhage or mass. Vascular: Negative for hyperdense vessel Skull: Negative Sinuses: Paranasal sinuses clear. Orbits: Bilateral cataract surgery Review of the MIP images confirms the above findings CTA NECK FINDINGS Aortic arch: Mild atherosclerotic disease in the aortic arch. Origin of left carotid from the innominate. Great vessels patent without significant stenosis. Right carotid system: Right carotid system widely patent. Mild atherosclerotic disease at the bifurcation Left carotid system: Left carotid system widely patent with mild atherosclerotic disease at the bifurcation Vertebral arteries: Both vertebral arteries are patent without significant stenosis. Skeleton: No acute abnormality.  Cervical spine degenerative change. Other neck: Enlargement of the left lobe of the thyroid compatible with goiter measuring 22 x 31 mm Upper chest: Negative Review of the MIP images confirms the above findings CTA HEAD FINDINGS Anterior circulation: Atherosclerotic disease in the cavernous carotid bilaterally without stenosis. Anterior and middle cerebral  arteries patent bilaterally without stenosis or aneurysm Posterior circulation: Both vertebral arteries patent to the basilar. Basilar widely patent. PICA patent bilaterally. AICA, superior cerebellar, and posterior cerebral arteries patent bilaterally. Fetal origin of the posterior cerebral artery bilaterally with hypoplastic distal basilar. Venous sinuses: Normal enhancement Anatomic variants: None Delayed phase: No enhancing lesions on delayed imaging Review of the MIP images confirms the above findings IMPRESSION: Carotid artery and vertebral artery widely patent in the neck Negative for emergent large vessel occlusion. No significant intracranial stenosis. Moderate atrophy and moderate chronic microvascular ischemia. Small left midbrain infarct best seen on MRI. Electronically Signed   By: Franchot Gallo M.D.   On: 09/18/2018 16:05   Mr Brain Wo Contrast  Result Date: 09/17/2018 CLINICAL DATA:  Speech difficulty. EXAM: MRI HEAD WITHOUT CONTRAST TECHNIQUE: Multiplanar, multiecho pulse sequences of the brain and surrounding structures were obtained without intravenous contrast. COMPARISON:  MRI 08/05/2018 FINDINGS: Brain: Image quality degraded by motion.  Abbreviated sequences. Probable small area of acute infarct in the left paramedian midbrain. Previous acute infarct in the left medial thalamus resolved on diffusion. Moderate atrophy most prominent in the temporal lobes. Chronic ischemic changes in the white matter.  Chronic infarct in the right thalamus. Negative for hemorrhage or mass Vascular: Normal arterial flow void Skull and upper cervical spine: Negative Sinuses/Orbits: Paranasal sinuses clear.  Bilateral cataract surgery Other: None IMPRESSION: Small area of restricted diffusion left paramedian midbrain most compatible with acute infarct. Atrophy and chronic ischemic change. Electronically Signed   By: Franchot Gallo M.D.   On: 09/17/2018 14:33   US Carotid Bilateral (at [facility]  Only)  Result Date: 09/17/2018 CLINICAL DATA:  Cerebral infarction, hypertension, hyperlipidemia and diabetes. EXAM: BILATERAL CAROTID DUPLEX ULTRASOUND TECHNIQUE: Pearline Cables scale imaging, color Doppler and duplex ultrasound were performed of bilateral carotid and vertebral arteries in the neck. COMPARISON:  None. FINDINGS: Criteria: Quantification of carotid stenosis is based on velocity parameters that correlate the residual internal carotid diameter with NASCET-based stenosis levels, using the diameter of the distal internal carotid lumen as the denominator for stenosis measurement. The following velocity measurements were obtained: RIGHT ICA:  74/15 cm/sec CCA:  16/10 cm/sec SYSTOLIC ICA/CCA RATIO:  1.5 ECA:  82 cm/sec LEFT ICA:  63/13 cm/sec CCA:  96/04 cm/sec SYSTOLIC ICA/CCA RATIO:  1.0 ECA:  77 cm/sec RIGHT CAROTID ARTERY: Mild amount of partially calcified plaque is present at the level of the right carotid bulb and proximal right ICA. Estimated right ICA stenosis is less than 50% RIGHT VERTEBRAL ARTERY: Antegrade flow with normal waveform and velocity. LEFT CAROTID ARTERY: There is a mild amount calcified plaque at the level of the carotid bulb and ICA origin. Estimated left ICA stenosis is less than 50%. LEFT VERTEBRAL ARTERY: Antegrade flow with normal waveform and velocity. IMPRESSION: Mild amount of plaque at the level of both carotid bulbs and proximal internal carotid arteries. No significant carotid stenosis is identified with estimated bilateral ICA stenoses of less than 50%. Electronically Signed   By: Aletta Edouard M.D.   On: 09/17/2018 16:12     Assessment/Plan:   84 y.o. female with a known history of recent right thalamic stroke in October 2019, diabetes, severe dementia, hypertension comes to the emergency room from nursing home after she was found to have altered level of consciousness when she was at the breakfasttable. Pt has history of thalamic and now a midbrain infarct likely  posterior circulation.  [place] h/n completed and vascular abnormalities Pt is to continue ASA and Plavix No further imaging at this time P/ot  09/19/2018, 12:40 PM

## 2018-09-19 NOTE — Progress Notes (Signed)
Pt becoming agitated and trying to get out of bed.  Pt with loud vocalizations and picking at sheets and gown.  Haldol 2.5 mg given IV. Dorna Bloom RN

## 2018-09-20 NOTE — Clinical Social Work Note (Signed)
Patient is medically ready for discharge back to [facility] today. CSW notified patient's son/guardian Arrielle Mcginn [phone]. Son states that he would prefer for patient to be transported by EMS. CSW notified Douglass Rivers of discharge today. Patient will be transported by EMS. RN to call for transport.   [place], [place]

## 2018-09-20 NOTE — Care Management Note (Signed)
Case Management Note  Patient Details  Name: Cathy Johnson MRN: [id number] Date of Birth: [birth year]  Subjective/Objective:  Admitted to [facility] with the diagnosis of CVA.                  Action/Plan: Physical therapy is recommending home healthPT supervision. Followed by [facility].  Text message to Sharmon Revere, [facility] representative.  Discharge today per Dr. Verdell Carmine.    Expected Discharge Date:  09/20/18               Expected Discharge Plan:     In-House Referral:   yes  Discharge planning Services   yes  Post Acute Care Choice:   Resumption of services Choice offered to:     DME Arranged:    DME Agency:     HH Arranged:   yes HH Agency:   [facility]  Status of Service:     If discussed at [place] of Stay Meetings, dates discussed:    Additional Comments:  Shelbie Ammons, RN MSN CCM Care Management [phone] 09/20/2018, 11:57 AM

## 2018-09-20 NOTE — Progress Notes (Signed)
  Speech Language Pathology Treatment: Dysphagia  Patient Details Name: Cathy Johnson MRN: [id number] DOB: 07/12/1928 Today's Date: 09/20/2018 Time: 0920-1000 SLP Time Calculation (min) (ACUTE ONLY): 40 min  Assessment / Plan / Recommendation Clinical Impression  Provided trials of upgraded consistency, nursing reported pt is much improved, more alert, and appears back to her baseline. Pt tolerated 6 bites soft solid, 3 sips honey thick liquid, 3 sips nectar thick liquid and 4 oz thin liquid via cup and straw with no overt s/s aspiration at bedside. Pt masticated soft solids efficiently and demonstrated adequate oral prep and coordination with A-P transit. No oral residue noted with any consistency tested. Pt tolerated thin liquids well taking multiple sips via straw with no overt s/s aspiration. However, pt impulsive taking repeated multiple large sips thin liquid via straw increasing risk of aspiration, recommend FULL SUPERVISION and assist w/self-feeding with all meals to ensure safe swallow rec's of single sips and small bites of soft solids (pinch straw to limit sips to single sips), ensuring pt has swallowed before taking next bite/sip, and position fully upright for all PO intake.  Will upgrade diet to Dysphagia III with thin liquids, rec cont to give meds crushed in puree, strict aspiration precautions, and assist with all meals to ensure compliance with above safe swallow rec's. Discussed above with nursing and MD, who are all in agreement.   HPI HPI: Per admitting H&P: Cathy Johnson  is a 84 y.o. female with a known history of recent right thalamic stroke in October 2019, diabetes, severe dementia, hypertension comes to the emergency room from nursing home after she was found to have altered level of consciousness when she was at the breakfast table.      SLP Plan  Continue with current plan of care       Recommendations  Diet recommendations: Dysphagia 3 (mechanical soft);Thin  liquid Liquids provided via: Cup;Straw Medication Administration: Crushed with puree Supervision: Staff to assist with self feeding;Full supervision/cueing for compensatory strategies Compensations: Minimize environmental distractions;Slow rate;Small sips/bites Postural Changes and/or Swallow Maneuvers: Out of bed for meals;Seated upright 90 degrees;Upright 30-60 min after meal                Oral Care Recommendations: Oral care BID;Staff/trained caregiver to provide oral care SLP Visit Diagnosis: Dysphagia, oropharyngeal phase (R13.12) Plan: Continue with current plan of care       GO                Cathy Mascio, MA, CCC-SLP 09/20/2018, 10:04 AM

## 2018-09-20 NOTE — Progress Notes (Signed)
OT Cancellation Note  Patient Details Name: Cathy Johnson MRN: [id number] DOB: Mar 13, 1928   Cancelled Treatment:    Reason Eval/Treat Not Completed: Other (comment). Pt in process of actively discharging from the hospital, unavailable for OT evaluation. Will re-attempt as appropriate.   Jeni Salles, MPH, MS, OTR/L ascom [phone] 09/20/18, 2:20 PM

## 2018-09-20 NOTE — Plan of Care (Signed)
  Problem: Education: Goal: Knowledge of disease or condition will improve Outcome: Progressing   Problem: Self-Care: Goal: Verbalization of feelings and concerns over difficulty with self-care will improve Outcome: Progressing   Problem: Ischemic Stroke/TIA Tissue Perfusion: Goal: Complications of ischemic stroke/TIA will be minimized Outcome: Progressing   Problem: Safety: Goal: Ability to remain free from injury will improve Outcome: Progressing

## 2018-09-20 NOTE — Discharge Summary (Signed)
[facility] NAME: Cathy Johnson    MR#:  [id number]  DATE OF BIRTH:  26-Apr-1928  DATE OF ADMISSION:  09/17/2018 ADMITTING PHYSICIAN: Fritzi Mandes, MD  DATE OF DISCHARGE: 09/20/2018  PRIMARY CARE PHYSICIAN: [facility]    ADMISSION DIAGNOSIS:  Aphasia [R47.01] Encephalopathy [G93.40]  DISCHARGE DIAGNOSIS:  Active Problems:   CVA (cerebral vascular accident) ([place])   SECONDARY DIAGNOSIS:   Past Medical History:  Diagnosis Date  . Arthritis   . Dementia ([place])   . Hypertension   . Osteoarthritis of right knee 05/02/2014  . PFO (patent foramen ovale)    PFO by bubble study 02/05/11 [facility]  . Stroke [facility])     HOSPITAL COURSE:   84 year old female with past medical history of previous CVA, dementia, osteoarthritis, hypertension who presents to the hospital due to altered mental status.  1.  Altered mental status- secondary to underlying dementia complicated acute CVA. - Patient presented to the hospital with worsening mental status and underwent an MRI of the brain which is positive for midbrain stroke.  No other focal metabolic abnormality. -Mental status much improved and back to baseline now.  2.  Acute CVA-this was the source of patient's worsening altered mental status. - Neurology consult was obtained, patient was maintained on her aspirin, Plavix and atorvastatin.  Neurology recommended doing a CTA of the head neck to rule out posterior circulation problems given her location of CVA. -CTA head neck was negative for Hemovac with significant carotid artery or intracranial stenosis.   -Patient was seen by physical therapy and occupational therapy and also speech.  Patient is being discharged back to her skilled nursing facility.  Initially seen by speech and placed on a pured diet but she has improved and now is being discharged on a dysphagia 3 diet with thin liquids with supervision during meals and aspiration  precautions.  3.  Essential hypertension- she will continue lisinopril, clonidine, Avapro.   4.  Dementia- she will continue Namenda, Risperdal  5.  Hypothyroidism- she will continue Synthroid.  Discharge to SNF today.   DISCHARGE CONDITIONS:   Stable.   CONSULTS OBTAINED:  Treatment Team:  Leotis Pain, MD  DRUG ALLERGIES:   Allergies  Allergen Reactions  . Boniva [Ibandronic Acid] Other (See Comments)    "Allergic," per MAR  . Crestor [Rosuvastatin Calcium] Other (See Comments)    "Allergic," per MAR  . Exelon [Rivastigmine Tartrate] Other (See Comments)    "Allergic," per MAR  . Fosamax [Alendronate Sodium] Other (See Comments)    "Allergic," per MAR  . Galantamine Other (See Comments)    "Allergic," per MAR  . Lipitor [Atorvastatin] Other (See Comments)    "Allergic," per MAR  . Morphine And Related Other (See Comments)    "Allergic," per MAR  . Other Other (See Comments)    "Sedatives" and "Multiple, unknown B/P meds" = "Allergic," per MAR  . Vagifem [Estradiol] Other (See Comments)    "Allergic," per [facility]    DISCHARGE MEDICATIONS:   Allergies as of 09/20/2018      Reactions   Boniva [ibandronic Acid] Other (See Comments)   "Allergic," per [facility]   Crestor [rosuvastatin Calcium] Other (See Comments)   "Allergic," per MAR   Exelon [rivastigmine Tartrate] Other (See Comments)   "Allergic," per MAR   Fosamax [alendronate Sodium] Other (See Comments)   "Allergic," per [facility]   Galantamine Other (See Comments)   "Allergic," per [facility]   Lipitor [atorvastatin]  Other (See Comments)   "Allergic," per MAR   Morphine And Related Other (See Comments)   "Allergic," per [facility]   Other Other (See Comments)   "Sedatives" and "Multiple, unknown B/P meds" = "Allergic," per MAR   Vagifem [estradiol] Other (See Comments)   "Allergic," per [facility]      Medication List    TAKE these medications   acetaminophen 500 MG tablet Commonly known as:  TYLENOL Take 1,000 mg by  mouth 2 (two) times daily.   aspirin 81 MG chewable tablet Chew 81 mg by mouth daily.   atorvastatin 20 MG tablet Commonly known as:  LIPITOR Take 20 mg by mouth at bedtime.   bisacodyl 10 MG/30ML Enem Commonly known as:  FLEET Place 10 mg rectally as needed (for constipation/if no relief or abdominal pain, CALL MD).   CALCI-CHEW 1250 (500 Ca) MG chewable tablet Generic drug:  calcium carbonate Chew 1 tablet by mouth 2 (two) times daily.   cloNIDine 0.1 MG tablet Commonly known as:  CATAPRES Take 0.1 mg by mouth 2 (two) times daily.   clopidogrel 75 MG tablet Commonly known as:  PLAVIX Take 1 tablet (75 mg total) by mouth daily.   diclofenac sodium 1 % Gel Commonly known as:  VOLTAREN Apply 4 g topically See admin instructions. Apply 4 grams to both knees two times a day and an additional 4 grams to both knees two times a day as needed for arthritis pain   ferrous sulfate 325 (65 FE) MG tablet Take 325 mg by mouth daily with breakfast.   guaifenesin 100 MG/5ML syrup Commonly known as:  ROBITUSSIN Take 200 mg by mouth every 6 (six) hours as needed (for 3 days, for coughing and notify MD if coughs persists for more than 3 days).   levothyroxine 112 MCG tablet Commonly known as:  SYNTHROID, LEVOTHROID Take 112 mcg by mouth daily before breakfast.   lisinopril 40 MG tablet Commonly known as:  PRINIVIL,ZESTRIL Take 40 mg by mouth daily.   loperamide 2 MG tablet Commonly known as:  IMODIUM A-D Take 2 mg by mouth See admin instructions. Take 2 mg by mouth after each loose stool as needed/max humber of doses, up to 3/12 hours and notify MD if no relief after 24 hrs   LORazepam 1 MG tablet Commonly known as:  ATIVAN Take 1 mg by mouth 2 (two) times daily.   memantine 10 MG tablet Commonly known as:  NAMENDA Take 10 mg by mouth 2 (two) times daily.   polyethylene glycol packet Commonly known as:  MIRALAX / GLYCOLAX Take 17 g by mouth daily.   risperiDONE 0.5 MG  tablet Commonly known as:  RISPERDAL Take 0.5 mg by mouth See admin instructions. Take 0.5 mg by mouth two times a day- at 2 PM and 8 PM   sertraline 50 MG tablet Commonly known as:  ZOLOFT Take 75 mg by mouth daily.   telmisartan 80 MG tablet Commonly known as:  MICARDIS Take 80 mg by mouth daily.   vitamin B-12 500 MCG tablet Commonly known as:  CYANOCOBALAMIN Take 500 mcg by mouth daily.   Vitamin D3 50 MCG (2000 UT) Tabs Take 2,000 Units by mouth daily.         DISCHARGE INSTRUCTIONS:   DIET:  Cardiac diet Dysphagia III with thin liquids and supervision with meals and aspiration Precautions.    DISCHARGE CONDITION:  Stable  ACTIVITY:  Activity as tolerated  OXYGEN:  Home Oxygen: No.   Oxygen  Delivery: room air  DISCHARGE LOCATION:  nursing home   If you experience worsening of your admission symptoms, develop shortness of breath, life threatening emergency, suicidal or homicidal thoughts you must seek medical attention immediately by calling 911 or calling your MD immediately  if symptoms less severe.  You Must read complete instructions/literature along with all the possible adverse reactions/side effects for all the Medicines you take and that have been prescribed to you. Take any new Medicines after you have completely understood and accpet all the possible adverse reactions/side effects.   Please note  You were cared for by a hospitalist during your hospital stay. If you have any questions about your discharge medications or the care you received while you were in the hospital after you are discharged, you can call the unit and asked to speak with the hospitalist on call if the hospitalist that took care of you is not available. Once you are discharged, your primary care physician will handle any further medical issues. Please note that NO REFILLS for any discharge medications will be authorized once you are discharged, as it is imperative that you return to  your primary care physician (or establish a relationship with a primary care physician if you do not have one) for your aftercare needs so that they can reassess your need for medications and monitor your lab values.     Today   Mental Status much improved and is at baseline.  Seen by speech and diet has been advanced to a dysphagia 3 with thin liquids.  VITAL SIGNS:  Blood pressure (!) 159/77, pulse 77, temperature 98.2 F (36.8 C), temperature source Axillary, resp. rate 20, height 5\' 2"  (1.575 m), weight 72.8 kg, SpO2 94 %.  I/O:    Intake/Output Summary (Last 24 hours) at 09/20/2018 1051 Last data filed at 09/19/2018 1855 Gross per 24 hour  Intake 10.89 ml  Output 750 ml  Net -739.11 ml    PHYSICAL EXAMINATION:  GENERAL:  84 y.o.-year-old patient sitting up in chair in NAD with some slurred speech which is her baseline. EYES: Pupils equal, round, reactive to light and accommodation. No scleral icterus. Extraocular muscles intact.  HEENT: Head atraumatic, normocephalic. Oropharynx and nasopharynx clear.  NECK:  Supple, no jugular venous distention. No thyroid enlargement, no tenderness.  LUNGS: Poor Resp. Effort, no wheezing, rales,rhonchi. No use of accessory muscles of respiration.  CARDIOVASCULAR: S1, S2 normal. No murmurs, rubs, or gallops.  ABDOMEN: Soft, non-tender, non-distended. Bowel sounds present. No organomegaly or mass.  EXTREMITIES: No pedal edema, cyanosis, or clubbing.  NEUROLOGIC: Cranial nerves II through XII are intact. No focal motor or sensory defecits b/l. Globally weak.  PSYCHIATRIC: The patient is alert and oriented x 1.  SKIN: No obvious rash, lesion, or ulcer.   DATA REVIEW:   CBC Recent Labs  Lab 09/17/18 0924  WBC 8.2  HGB 13.3  HCT 41.2  PLT 270    Chemistries  Recent Labs  Lab 09/17/18 0924 09/19/18 0444  NA 142 142  K 5.3* 3.6  CL 109 108  CO2 23 26  GLUCOSE 104* 109*  BUN 24* 17  CREATININE 0.88 0.76  CALCIUM 9.0 8.8*  AST  25  --   ALT 13  --   ALKPHOS 112  --   BILITOT 1.1  --     Cardiac Enzymes Recent Labs  Lab 09/17/18 0924  TROPONINI <0.03    Microbiology Results  Results for orders placed or performed during the hospital encounter  of 09/17/18  MRSA PCR Screening     Status: None   Collection Time: 09/17/18  4:24 PM  Result Value Ref Range Status   MRSA by PCR NEGATIVE NEGATIVE Final    Comment:        The GeneXpert MRSA Assay (FDA approved for NASAL specimens only), is one component of a comprehensive MRSA colonization surveillance program. It is not intended to diagnose MRSA infection nor to guide or monitor treatment for MRSA infections. Performed at [facility], [address] [place], [place] [address]     RADIOLOGY:  Ct Angio Head W Or Wo Contrast  Result Date: 09/18/2018 CLINICAL DATA:  Stroke EXAM: CT ANGIOGRAPHY HEAD AND NECK TECHNIQUE: Multidetector CT imaging of the head and neck was performed using the standard protocol during bolus administration of intravenous contrast. Multiplanar CT image reconstructions and MIPs were obtained to evaluate the vascular anatomy. Carotid stenosis measurements (when applicable) are obtained utilizing NASCET criteria, using the distal internal carotid diameter as the denominator. CONTRAST:  59mL OMNIPAQUE IOHEXOL 350 MG/ML SOLN COMPARISON:  MRI head 09/17/2018 FINDINGS: CT HEAD FINDINGS Brain: Small areas of acute infarct in the left midbrain are best seen on MRI. Moderate atrophy and moderate chronic ischemic change. Negative for hemorrhage or mass. Vascular: Negative for hyperdense vessel Skull: Negative Sinuses: Paranasal sinuses clear. Orbits: Bilateral cataract surgery Review of the MIP images confirms the above findings CTA NECK FINDINGS Aortic arch: Mild atherosclerotic disease in the aortic arch. Origin of left carotid from the innominate. Great vessels patent without significant stenosis. Right carotid system: Right carotid  system widely patent. Mild atherosclerotic disease at the bifurcation Left carotid system: Left carotid system widely patent with mild atherosclerotic disease at the bifurcation Vertebral arteries: Both vertebral arteries are patent without significant stenosis. Skeleton: No acute abnormality.  Cervical spine degenerative change. Other neck: Enlargement of the left lobe of the thyroid compatible with goiter measuring 22 x 31 mm Upper chest: Negative Review of the MIP images confirms the above findings CTA HEAD FINDINGS Anterior circulation: Atherosclerotic disease in the cavernous carotid bilaterally without stenosis. Anterior and middle cerebral arteries patent bilaterally without stenosis or aneurysm Posterior circulation: Both vertebral arteries patent to the basilar. Basilar widely patent. PICA patent bilaterally. AICA, superior cerebellar, and posterior cerebral arteries patent bilaterally. Fetal origin of the posterior cerebral artery bilaterally with hypoplastic distal basilar. Venous sinuses: Normal enhancement Anatomic variants: None Delayed phase: No enhancing lesions on delayed imaging Review of the MIP images confirms the above findings IMPRESSION: Carotid artery and vertebral artery widely patent in the neck Negative for emergent large vessel occlusion. No significant intracranial stenosis. Moderate atrophy and moderate chronic microvascular ischemia. Small left midbrain infarct best seen on MRI. Electronically Signed   By: Franchot Gallo M.D.   On: 09/18/2018 16:05   Ct Angio Neck W Or Wo Contrast  Result Date: 09/18/2018 CLINICAL DATA:  Stroke EXAM: CT ANGIOGRAPHY HEAD AND NECK TECHNIQUE: Multidetector CT imaging of the head and neck was performed using the standard protocol during bolus administration of intravenous contrast. Multiplanar CT image reconstructions and MIPs were obtained to evaluate the vascular anatomy. Carotid stenosis measurements (when applicable) are obtained utilizing NASCET  criteria, using the distal internal carotid diameter as the denominator. CONTRAST:  29mL OMNIPAQUE IOHEXOL 350 MG/ML SOLN COMPARISON:  MRI head 09/17/2018 FINDINGS: CT HEAD FINDINGS Brain: Small areas of acute infarct in the left midbrain are best seen on MRI. Moderate atrophy and moderate chronic ischemic change. Negative for hemorrhage  or mass. Vascular: Negative for hyperdense vessel Skull: Negative Sinuses: Paranasal sinuses clear. Orbits: Bilateral cataract surgery Review of the MIP images confirms the above findings CTA NECK FINDINGS Aortic arch: Mild atherosclerotic disease in the aortic arch. Origin of left carotid from the innominate. Great vessels patent without significant stenosis. Right carotid system: Right carotid system widely patent. Mild atherosclerotic disease at the bifurcation Left carotid system: Left carotid system widely patent with mild atherosclerotic disease at the bifurcation Vertebral arteries: Both vertebral arteries are patent without significant stenosis. Skeleton: No acute abnormality.  Cervical spine degenerative change. Other neck: Enlargement of the left lobe of the thyroid compatible with goiter measuring 22 x 31 mm Upper chest: Negative Review of the MIP images confirms the above findings CTA HEAD FINDINGS Anterior circulation: Atherosclerotic disease in the cavernous carotid bilaterally without stenosis. Anterior and middle cerebral arteries patent bilaterally without stenosis or aneurysm Posterior circulation: Both vertebral arteries patent to the basilar. Basilar widely patent. PICA patent bilaterally. AICA, superior cerebellar, and posterior cerebral arteries patent bilaterally. Fetal origin of the posterior cerebral artery bilaterally with hypoplastic distal basilar. Venous sinuses: Normal enhancement Anatomic variants: None Delayed phase: No enhancing lesions on delayed imaging Review of the MIP images confirms the above findings IMPRESSION: Carotid artery and vertebral  artery widely patent in the neck Negative for emergent large vessel occlusion. No significant intracranial stenosis. Moderate atrophy and moderate chronic microvascular ischemia. Small left midbrain infarct best seen on MRI. Electronically Signed   By: Franchot Gallo M.D.   On: 09/18/2018 16:05      Management plans discussed with the patient, family and they are in agreement.  CODE STATUS:     Code Status Orders  (From admission, onward)         Start     Ordered   09/17/18 1448  Do not attempt resuscitation (DNR)  Continuous    Question Answer Comment  In the event of cardiac or respiratory ARREST Do not call a "code blue"   In the event of cardiac or respiratory ARREST Do not perform Intubation, CPR, defibrillation or ACLS   In the event of cardiac or respiratory ARREST Use medication by any route, position, wound care, and other measures to relive pain and suffering. May use oxygen, suction and manual treatment of airway obstruction as needed for comfort.      09/17/18 1447         TOTAL TIME TAKING CARE OF THIS PATIENT: 40 minutes.    Henreitta Leber M.D on 09/20/2018 at 10:51 AM  Between 7am to 6pm - Pager - [phone]  After 6pm go to [email or web address] - [employer] [facility]  Office  [phone]  CC: Primary care physician; [facility]

## 2018-09-21 DIAGNOSIS — I10 Essential (primary) hypertension: Secondary | ICD-10-CM | POA: Diagnosis not present

## 2018-09-21 DIAGNOSIS — M810 Age-related osteoporosis without current pathological fracture: Secondary | ICD-10-CM | POA: Diagnosis not present

## 2018-09-21 DIAGNOSIS — F0280 Dementia in other diseases classified elsewhere without behavioral disturbance: Secondary | ICD-10-CM | POA: Diagnosis not present

## 2018-09-21 DIAGNOSIS — D649 Anemia, unspecified: Secondary | ICD-10-CM | POA: Diagnosis not present

## 2018-09-21 DIAGNOSIS — M1711 Unilateral primary osteoarthritis, right knee: Secondary | ICD-10-CM | POA: Diagnosis not present

## 2018-09-21 DIAGNOSIS — E039 Hypothyroidism, unspecified: Secondary | ICD-10-CM | POA: Diagnosis not present

## 2018-09-23 DIAGNOSIS — E039 Hypothyroidism, unspecified: Secondary | ICD-10-CM | POA: Diagnosis not present

## 2018-09-23 DIAGNOSIS — D649 Anemia, unspecified: Secondary | ICD-10-CM | POA: Diagnosis not present

## 2018-09-23 DIAGNOSIS — I10 Essential (primary) hypertension: Secondary | ICD-10-CM | POA: Diagnosis not present

## 2018-09-23 DIAGNOSIS — M1711 Unilateral primary osteoarthritis, right knee: Secondary | ICD-10-CM | POA: Diagnosis not present

## 2018-09-23 DIAGNOSIS — M810 Age-related osteoporosis without current pathological fracture: Secondary | ICD-10-CM | POA: Diagnosis not present

## 2018-09-23 DIAGNOSIS — F0280 Dementia in other diseases classified elsewhere without behavioral disturbance: Secondary | ICD-10-CM | POA: Diagnosis not present

## 2018-09-24 DIAGNOSIS — E038 Other specified hypothyroidism: Secondary | ICD-10-CM | POA: Diagnosis not present

## 2018-09-24 DIAGNOSIS — F0280 Dementia in other diseases classified elsewhere without behavioral disturbance: Secondary | ICD-10-CM | POA: Diagnosis not present

## 2018-09-24 DIAGNOSIS — I639 Cerebral infarction, unspecified: Secondary | ICD-10-CM | POA: Diagnosis not present

## 2018-09-24 DIAGNOSIS — E039 Hypothyroidism, unspecified: Secondary | ICD-10-CM | POA: Diagnosis not present

## 2018-09-24 DIAGNOSIS — E782 Mixed hyperlipidemia: Secondary | ICD-10-CM | POA: Diagnosis not present

## 2018-09-24 DIAGNOSIS — D649 Anemia, unspecified: Secondary | ICD-10-CM | POA: Diagnosis not present

## 2018-09-24 DIAGNOSIS — M810 Age-related osteoporosis without current pathological fracture: Secondary | ICD-10-CM | POA: Diagnosis not present

## 2018-09-24 DIAGNOSIS — F0390 Unspecified dementia without behavioral disturbance: Secondary | ICD-10-CM | POA: Diagnosis not present

## 2018-09-24 DIAGNOSIS — M1711 Unilateral primary osteoarthritis, right knee: Secondary | ICD-10-CM | POA: Diagnosis not present

## 2018-09-24 DIAGNOSIS — I10 Essential (primary) hypertension: Secondary | ICD-10-CM | POA: Diagnosis not present

## 2018-09-24 DIAGNOSIS — E785 Hyperlipidemia, unspecified: Secondary | ICD-10-CM | POA: Diagnosis not present

## 2018-09-24 DIAGNOSIS — D518 Other vitamin B12 deficiency anemias: Secondary | ICD-10-CM | POA: Diagnosis not present

## 2018-09-24 DIAGNOSIS — E119 Type 2 diabetes mellitus without complications: Secondary | ICD-10-CM | POA: Diagnosis not present

## 2018-09-28 DIAGNOSIS — D649 Anemia, unspecified: Secondary | ICD-10-CM | POA: Diagnosis not present

## 2018-09-28 DIAGNOSIS — M1711 Unilateral primary osteoarthritis, right knee: Secondary | ICD-10-CM | POA: Diagnosis not present

## 2018-09-28 DIAGNOSIS — F0280 Dementia in other diseases classified elsewhere without behavioral disturbance: Secondary | ICD-10-CM | POA: Diagnosis not present

## 2018-09-28 DIAGNOSIS — E039 Hypothyroidism, unspecified: Secondary | ICD-10-CM | POA: Diagnosis not present

## 2018-09-28 DIAGNOSIS — I10 Essential (primary) hypertension: Secondary | ICD-10-CM | POA: Diagnosis not present

## 2018-09-28 DIAGNOSIS — M810 Age-related osteoporosis without current pathological fracture: Secondary | ICD-10-CM | POA: Diagnosis not present

## 2018-09-29 DIAGNOSIS — E039 Hypothyroidism, unspecified: Secondary | ICD-10-CM | POA: Diagnosis not present

## 2018-09-29 DIAGNOSIS — F0280 Dementia in other diseases classified elsewhere without behavioral disturbance: Secondary | ICD-10-CM | POA: Diagnosis not present

## 2018-09-29 DIAGNOSIS — M1711 Unilateral primary osteoarthritis, right knee: Secondary | ICD-10-CM | POA: Diagnosis not present

## 2018-09-29 DIAGNOSIS — D649 Anemia, unspecified: Secondary | ICD-10-CM | POA: Diagnosis not present

## 2018-09-29 DIAGNOSIS — M810 Age-related osteoporosis without current pathological fracture: Secondary | ICD-10-CM | POA: Diagnosis not present

## 2018-09-29 DIAGNOSIS — I10 Essential (primary) hypertension: Secondary | ICD-10-CM | POA: Diagnosis not present

## 2018-09-30 DIAGNOSIS — E038 Other specified hypothyroidism: Secondary | ICD-10-CM | POA: Diagnosis not present

## 2018-09-30 DIAGNOSIS — E119 Type 2 diabetes mellitus without complications: Secondary | ICD-10-CM | POA: Diagnosis not present

## 2018-09-30 DIAGNOSIS — E559 Vitamin D deficiency, unspecified: Secondary | ICD-10-CM | POA: Diagnosis not present

## 2018-09-30 DIAGNOSIS — Z79899 Other long term (current) drug therapy: Secondary | ICD-10-CM | POA: Diagnosis not present

## 2018-09-30 DIAGNOSIS — E7849 Other hyperlipidemia: Secondary | ICD-10-CM | POA: Diagnosis not present

## 2018-09-30 DIAGNOSIS — D518 Other vitamin B12 deficiency anemias: Secondary | ICD-10-CM | POA: Diagnosis not present

## 2018-10-01 DIAGNOSIS — M810 Age-related osteoporosis without current pathological fracture: Secondary | ICD-10-CM | POA: Diagnosis not present

## 2018-10-01 DIAGNOSIS — D649 Anemia, unspecified: Secondary | ICD-10-CM | POA: Diagnosis not present

## 2018-10-01 DIAGNOSIS — M1711 Unilateral primary osteoarthritis, right knee: Secondary | ICD-10-CM | POA: Diagnosis not present

## 2018-10-01 DIAGNOSIS — I10 Essential (primary) hypertension: Secondary | ICD-10-CM | POA: Diagnosis not present

## 2018-10-01 DIAGNOSIS — B351 Tinea unguium: Secondary | ICD-10-CM | POA: Diagnosis not present

## 2018-10-01 DIAGNOSIS — I739 Peripheral vascular disease, unspecified: Secondary | ICD-10-CM | POA: Diagnosis not present

## 2018-10-01 DIAGNOSIS — E039 Hypothyroidism, unspecified: Secondary | ICD-10-CM | POA: Diagnosis not present

## 2018-10-01 DIAGNOSIS — M79675 Pain in left toe(s): Secondary | ICD-10-CM | POA: Diagnosis not present

## 2018-10-01 DIAGNOSIS — F0280 Dementia in other diseases classified elsewhere without behavioral disturbance: Secondary | ICD-10-CM | POA: Diagnosis not present

## 2018-10-04 DIAGNOSIS — D649 Anemia, unspecified: Secondary | ICD-10-CM | POA: Diagnosis not present

## 2018-10-04 DIAGNOSIS — I10 Essential (primary) hypertension: Secondary | ICD-10-CM | POA: Diagnosis not present

## 2018-10-04 DIAGNOSIS — E039 Hypothyroidism, unspecified: Secondary | ICD-10-CM | POA: Diagnosis not present

## 2018-10-04 DIAGNOSIS — M810 Age-related osteoporosis without current pathological fracture: Secondary | ICD-10-CM | POA: Diagnosis not present

## 2018-10-04 DIAGNOSIS — M1711 Unilateral primary osteoarthritis, right knee: Secondary | ICD-10-CM | POA: Diagnosis not present

## 2018-10-04 DIAGNOSIS — F0280 Dementia in other diseases classified elsewhere without behavioral disturbance: Secondary | ICD-10-CM | POA: Diagnosis not present

## 2018-10-05 DIAGNOSIS — M810 Age-related osteoporosis without current pathological fracture: Secondary | ICD-10-CM | POA: Diagnosis not present

## 2018-10-05 DIAGNOSIS — D649 Anemia, unspecified: Secondary | ICD-10-CM | POA: Diagnosis not present

## 2018-10-05 DIAGNOSIS — E039 Hypothyroidism, unspecified: Secondary | ICD-10-CM | POA: Diagnosis not present

## 2018-10-05 DIAGNOSIS — M1711 Unilateral primary osteoarthritis, right knee: Secondary | ICD-10-CM | POA: Diagnosis not present

## 2018-10-05 DIAGNOSIS — F0280 Dementia in other diseases classified elsewhere without behavioral disturbance: Secondary | ICD-10-CM | POA: Diagnosis not present

## 2018-10-05 DIAGNOSIS — I10 Essential (primary) hypertension: Secondary | ICD-10-CM | POA: Diagnosis not present

## 2018-10-06 DIAGNOSIS — F0280 Dementia in other diseases classified elsewhere without behavioral disturbance: Secondary | ICD-10-CM | POA: Diagnosis not present

## 2018-10-06 DIAGNOSIS — E039 Hypothyroidism, unspecified: Secondary | ICD-10-CM | POA: Diagnosis not present

## 2018-10-06 DIAGNOSIS — I10 Essential (primary) hypertension: Secondary | ICD-10-CM | POA: Diagnosis not present

## 2018-10-06 DIAGNOSIS — M810 Age-related osteoporosis without current pathological fracture: Secondary | ICD-10-CM | POA: Diagnosis not present

## 2018-10-06 DIAGNOSIS — M1711 Unilateral primary osteoarthritis, right knee: Secondary | ICD-10-CM | POA: Diagnosis not present

## 2018-10-06 DIAGNOSIS — D649 Anemia, unspecified: Secondary | ICD-10-CM | POA: Diagnosis not present

## 2018-10-08 DIAGNOSIS — E785 Hyperlipidemia, unspecified: Secondary | ICD-10-CM | POA: Diagnosis not present

## 2018-10-08 DIAGNOSIS — M1990 Unspecified osteoarthritis, unspecified site: Secondary | ICD-10-CM | POA: Diagnosis not present

## 2018-10-08 DIAGNOSIS — F0280 Dementia in other diseases classified elsewhere without behavioral disturbance: Secondary | ICD-10-CM | POA: Diagnosis not present

## 2018-10-08 DIAGNOSIS — I10 Essential (primary) hypertension: Secondary | ICD-10-CM | POA: Diagnosis not present

## 2018-10-08 DIAGNOSIS — M810 Age-related osteoporosis without current pathological fracture: Secondary | ICD-10-CM | POA: Diagnosis not present

## 2018-10-08 DIAGNOSIS — D649 Anemia, unspecified: Secondary | ICD-10-CM | POA: Diagnosis not present

## 2018-10-08 DIAGNOSIS — E039 Hypothyroidism, unspecified: Secondary | ICD-10-CM | POA: Diagnosis not present

## 2018-10-08 DIAGNOSIS — M1711 Unilateral primary osteoarthritis, right knee: Secondary | ICD-10-CM | POA: Diagnosis not present

## 2018-10-08 DIAGNOSIS — F0151 Vascular dementia with behavioral disturbance: Secondary | ICD-10-CM | POA: Diagnosis not present

## 2018-10-11 DIAGNOSIS — M810 Age-related osteoporosis without current pathological fracture: Secondary | ICD-10-CM | POA: Diagnosis not present

## 2018-10-11 DIAGNOSIS — I10 Essential (primary) hypertension: Secondary | ICD-10-CM | POA: Diagnosis not present

## 2018-10-11 DIAGNOSIS — M1711 Unilateral primary osteoarthritis, right knee: Secondary | ICD-10-CM | POA: Diagnosis not present

## 2018-10-11 DIAGNOSIS — D649 Anemia, unspecified: Secondary | ICD-10-CM | POA: Diagnosis not present

## 2018-10-11 DIAGNOSIS — E039 Hypothyroidism, unspecified: Secondary | ICD-10-CM | POA: Diagnosis not present

## 2018-10-11 DIAGNOSIS — F0280 Dementia in other diseases classified elsewhere without behavioral disturbance: Secondary | ICD-10-CM | POA: Diagnosis not present

## 2018-10-12 DIAGNOSIS — I10 Essential (primary) hypertension: Secondary | ICD-10-CM | POA: Diagnosis not present

## 2018-10-12 DIAGNOSIS — D649 Anemia, unspecified: Secondary | ICD-10-CM | POA: Diagnosis not present

## 2018-10-12 DIAGNOSIS — E039 Hypothyroidism, unspecified: Secondary | ICD-10-CM | POA: Diagnosis not present

## 2018-10-12 DIAGNOSIS — F0280 Dementia in other diseases classified elsewhere without behavioral disturbance: Secondary | ICD-10-CM | POA: Diagnosis not present

## 2018-10-12 DIAGNOSIS — M1711 Unilateral primary osteoarthritis, right knee: Secondary | ICD-10-CM | POA: Diagnosis not present

## 2018-10-12 DIAGNOSIS — M810 Age-related osteoporosis without current pathological fracture: Secondary | ICD-10-CM | POA: Diagnosis not present

## 2018-10-25 ENCOUNTER — Telehealth: Payer: Self-pay | Admitting: Nurse Practitioner

## 2018-10-25 NOTE — Telephone Encounter (Signed)
Cathy Johnson, Cathy Johnson called for update on increase in skill level and facility meeting notified him it was time to transition to a long-term care [place]. We talked about previous palliative care visit in discussion with increasing skill level with ADLs requiring a with ADLs requiring a Hoyer lift in the setting of progression of dementia. We talked about Cathy Johnson slow overall decline. Cathy Johnson endorses he did contact [place] worker with palliative care and he has facilities that he is getting ready to go look at. He's in the process of getting an fl2. Discuss the role of palliative care and that we'll be able to continue to follow as she transitions to long-term [place]. Therapeutic listening and emotional support provided. Contact information provided. Questions answer to satisfaction.  Total time spent 35 minutes  Documentation 10 minutes  Phone discussion 25 minutes

## 2018-10-30 ENCOUNTER — Telehealth: Payer: Self-pay

## 2018-10-30 NOTE — Telephone Encounter (Signed)
Received fax of updated FL-2 for patient from Hanley Ben, NP as requested. Patient's son [name] has informed SW that he is looking at moving patient to higher level of care and is looking for SNF placement. SW reviewed placement process, and [name] is to research preferred facilities. SW will fax FL-2 and needed info to SNFs once son decides.

## 2018-11-01 ENCOUNTER — Emergency Department: Payer: Medicare Other

## 2018-11-01 ENCOUNTER — Inpatient Hospital Stay
Admission: EM | Admit: 2018-11-01 | Discharge: 2018-11-05 | DRG: 072 | Disposition: A | Discharge status: Skilled Nursing Facility | Payer: Medicare Other | Attending: Specialist | Admitting: Specialist

## 2018-11-01 ENCOUNTER — Observation Stay: Payer: Medicare Other

## 2018-11-01 ENCOUNTER — Encounter: Payer: Self-pay | Admitting: Emergency Medicine

## 2018-11-01 ENCOUNTER — Other Ambulatory Visit: Payer: Self-pay

## 2018-11-01 DIAGNOSIS — I10 Essential (primary) hypertension: Secondary | ICD-10-CM | POA: Diagnosis present

## 2018-11-01 DIAGNOSIS — Z7902 Long term (current) use of antithrombotics/antiplatelets: Secondary | ICD-10-CM

## 2018-11-01 DIAGNOSIS — Z885 Allergy status to narcotic agent status: Secondary | ICD-10-CM

## 2018-11-01 DIAGNOSIS — R4182 Altered mental status, unspecified: Secondary | ICD-10-CM

## 2018-11-01 DIAGNOSIS — Z888 Allergy status to other drugs, medicaments and biological substances status: Secondary | ICD-10-CM

## 2018-11-01 DIAGNOSIS — Z8249 Family history of ischemic heart disease and other diseases of the circulatory system: Secondary | ICD-10-CM

## 2018-11-01 DIAGNOSIS — D638 Anemia in other chronic diseases classified elsewhere: Secondary | ICD-10-CM | POA: Diagnosis present

## 2018-11-01 DIAGNOSIS — G9341 Metabolic encephalopathy: Secondary | ICD-10-CM | POA: Diagnosis not present

## 2018-11-01 DIAGNOSIS — E039 Hypothyroidism, unspecified: Secondary | ICD-10-CM | POA: Diagnosis present

## 2018-11-01 DIAGNOSIS — Z79890 Hormone replacement therapy (postmenopausal): Secondary | ICD-10-CM

## 2018-11-01 DIAGNOSIS — Z8673 Personal history of transient ischemic attack (TIA), and cerebral infarction without residual deficits: Secondary | ICD-10-CM

## 2018-11-01 DIAGNOSIS — Z96651 Presence of right artificial knee joint: Secondary | ICD-10-CM | POA: Diagnosis present

## 2018-11-01 DIAGNOSIS — F0390 Unspecified dementia without behavioral disturbance: Secondary | ICD-10-CM | POA: Diagnosis present

## 2018-11-01 DIAGNOSIS — G9340 Encephalopathy, unspecified: Secondary | ICD-10-CM | POA: Diagnosis present

## 2018-11-01 DIAGNOSIS — Z66 Do not resuscitate: Secondary | ICD-10-CM | POA: Diagnosis present

## 2018-11-01 DIAGNOSIS — E785 Hyperlipidemia, unspecified: Secondary | ICD-10-CM | POA: Diagnosis present

## 2018-11-01 DIAGNOSIS — Z7982 Long term (current) use of aspirin: Secondary | ICD-10-CM

## 2018-11-01 DIAGNOSIS — Z79899 Other long term (current) drug therapy: Secondary | ICD-10-CM

## 2018-11-01 LAB — BLOOD GAS, VENOUS
Acid-Base Excess: 1.2 mmol/L (ref 0.0–2.0)
Bicarbonate: 27.6 mmol/L (ref 20.0–28.0)
FIO2: 0.21
O2 Saturation: 74.6 %
PO2 VEN: 42 mmHg (ref 32.0–45.0)
Patient temperature: 37
pCO2, Ven: 50 mmHg (ref 44.0–60.0)
pH, Ven: 7.35 (ref 7.250–7.430)

## 2018-11-01 LAB — ETHANOL: Alcohol, Ethyl (B): <10 mg/dL (ref <10)

## 2018-11-01 LAB — CBC WITH DIFFERENTIAL/PLATELET
Abs Immature Granulocytes: 0.04 10*3/uL (ref 0.00–0.07)
Basophils Absolute: 0 10*3/uL (ref 0.0–0.1)
Basophils Relative: 0 %
EOS ABS: 0.3 10*3/uL (ref 0.0–0.5)
Eosinophils Relative: 3 %
HCT: 38.3 % (ref 36.0–46.0)
Hemoglobin: 12.2 g/dL (ref 12.0–15.0)
Immature Granulocytes: 0 %
Lymphocytes Relative: 16 %
Lymphs Abs: 1.7 10*3/uL (ref 0.7–4.0)
MCH: 29.5 pg (ref 26.0–34.0)
MCHC: 31.9 g/dL (ref 30.0–36.0)
MCV: 92.5 fL (ref 80.0–100.0)
Monocytes Absolute: 0.9 10*3/uL (ref 0.1–1.0)
Monocytes Relative: 8 %
Neutro Abs: 7.5 10*3/uL (ref 1.7–7.7)
Neutrophils Relative %: 73 %
Platelets: 267 10*3/uL (ref 150–400)
RBC: 4.14 MIL/uL (ref 3.87–5.11)
RDW: 14.5 % (ref 11.5–15.5)
WBC: 10.4 10*3/uL (ref 4.0–10.5)
nRBC: 0 % (ref 0.0–0.2)

## 2018-11-01 LAB — COMPREHENSIVE METABOLIC PANEL
ALT: 37 U/L (ref 0–44)
AST: 23 U/L (ref 15–41)
Albumin: 3.8 g/dL (ref 3.5–5.0)
Alkaline Phosphatase: 127 U/L — ABNORMAL HIGH (ref 38–126)
Anion gap: 8 (ref 5–15)
BUN: 30 mg/dL — ABNORMAL HIGH (ref 8–23)
CO2: 25 mmol/L (ref 22–32)
Calcium: 9.2 mg/dL (ref 8.9–10.3)
Chloride: 105 mmol/L (ref 98–111)
Creatinine, Ser: 1.41 mg/dL — ABNORMAL HIGH (ref 0.44–1.00)
GFR calc Af Amer: 38 mL/min — ABNORMAL LOW (ref >60)
GFR, EST NON AFRICAN AMERICAN: 33 mL/min — AB (ref >60)
Glucose, Bld: 113 mg/dL — ABNORMAL HIGH (ref 70–99)
Potassium: 4.3 mmol/L (ref 3.5–5.1)
Sodium: 138 mmol/L (ref 135–145)
TOTAL PROTEIN: 6.4 g/dL — AB (ref 6.5–8.1)
Total Bilirubin: 0.6 mg/dL (ref 0.3–1.2)

## 2018-11-01 LAB — BRAIN NATRIURETIC PEPTIDE: B NATRIURETIC PEPTIDE 5: 161 pg/mL — AB (ref 0.0–100.0)

## 2018-11-01 LAB — LACTIC ACID, PLASMA
Lactic Acid, Venous: 1 mmol/L (ref 0.5–1.9)
Lactic Acid, Venous: 0.8 mmol/L (ref 0.5–1.9)

## 2018-11-01 LAB — TROPONIN I: Troponin I: 0.04 ng/mL (ref <0.03)

## 2018-11-01 LAB — TSH: TSH: 3.282 u[IU]/mL (ref 0.350–4.500)

## 2018-11-01 LAB — ACETAMINOPHEN LEVEL: Acetaminophen (Tylenol), Serum: 12 ug/mL (ref 10–30)

## 2018-11-01 LAB — AMMONIA: Ammonia: 21 umol/L (ref 9–35)

## 2018-11-01 MED ORDER — LISINOPRIL 20 MG PO TABS
40.0000 mg | ORAL_TABLET | Freq: Every day | ORAL | Status: DC
  Administered 2018-11-02 – 2018-11-03 (×2): 40 mg via ORAL
  Filled 2018-11-01 (×2): qty 2

## 2018-11-01 MED ORDER — ONDANSETRON HCL 4 MG/2ML IJ SOLN: 4.0000 mg | Freq: Four times a day (QID) | INTRAMUSCULAR | Status: DC | PRN

## 2018-11-01 MED ORDER — RISPERIDONE 0.5 MG PO TABS
0.5000 mg | ORAL_TABLET | Freq: Two times a day (BID) | ORAL | Status: DC
  Administered 2018-11-02 – 2018-11-05 (×8): 0.5 mg via ORAL
  Filled 2018-11-01 (×10): qty 1

## 2018-11-01 MED ORDER — CLONIDINE HCL 0.1 MG PO TABS
0.1000 mg | ORAL_TABLET | Freq: Two times a day (BID) | ORAL | Status: DC
  Administered 2018-11-02: 0.1 mg via ORAL
  Filled 2018-11-01: qty 1

## 2018-11-01 MED ORDER — ACETAMINOPHEN 325 MG PO TABS
650.0000 mg | ORAL_TABLET | Freq: Four times a day (QID) | ORAL | Status: DC | PRN
  Administered 2018-11-04: 650 mg via ORAL
  Filled 2018-11-01: qty 2

## 2018-11-01 MED ORDER — VITAMIN D 25 MCG (1000 UNIT) PO TABS
2000.0000 [IU] | ORAL_TABLET | Freq: Every day | ORAL | Status: DC
  Administered 2018-11-02 – 2018-11-05 (×4): 2000 [IU] via ORAL
  Filled 2018-11-01 (×4): qty 2

## 2018-11-01 MED ORDER — SERTRALINE HCL 50 MG PO TABS
75.0000 mg | ORAL_TABLET | Freq: Every day | ORAL | Status: DC
  Administered 2018-11-02 – 2018-11-05 (×4): 75 mg via ORAL
  Filled 2018-11-01 (×4): qty 2

## 2018-11-01 MED ORDER — NALOXONE HCL 2 MG/2ML IJ SOSY
PREFILLED_SYRINGE | INTRAMUSCULAR | Status: AC
  Administered 2018-11-01: 2 mg via INTRAVENOUS
  Filled 2018-11-01: qty 2

## 2018-11-01 MED ORDER — CLOPIDOGREL BISULFATE 75 MG PO TABS
75.0000 mg | ORAL_TABLET | Freq: Every day | ORAL | Status: DC
  Administered 2018-11-02 – 2018-11-05 (×4): 75 mg via ORAL
  Filled 2018-11-01 (×4): qty 1

## 2018-11-01 MED ORDER — FERROUS SULFATE 325 (65 FE) MG PO TABS
325.0000 mg | ORAL_TABLET | Freq: Every day | ORAL | Status: DC
  Administered 2018-11-02 – 2018-11-05 (×4): 325 mg via ORAL
  Filled 2018-11-01 (×4): qty 1

## 2018-11-01 MED ORDER — LOPERAMIDE HCL 2 MG PO CAPS: 2.0000 mg | ORAL_CAPSULE | ORAL | Status: DC | PRN

## 2018-11-01 MED ORDER — ENOXAPARIN SODIUM 30 MG/0.3ML ~~LOC~~ SOLN
30.0000 mg | SUBCUTANEOUS | Status: DC
  Administered 2018-11-02: 30 mg via SUBCUTANEOUS
  Filled 2018-11-01: qty 0.3

## 2018-11-01 MED ORDER — LORAZEPAM 1 MG PO TABS
1.0000 mg | ORAL_TABLET | Freq: Two times a day (BID) | ORAL | Status: DC
  Administered 2018-11-02 (×3): 1 mg via ORAL
  Filled 2018-11-01 (×3): qty 1

## 2018-11-01 MED ORDER — ACETAMINOPHEN 500 MG PO TABS
1000.0000 mg | ORAL_TABLET | Freq: Two times a day (BID) | ORAL | Status: DC
  Administered 2018-11-02 – 2018-11-03 (×3): 1000 mg via ORAL
  Filled 2018-11-01 (×3): qty 2

## 2018-11-01 MED ORDER — CALCIUM CARBONATE ANTACID 500 MG PO CHEW
1250.0000 mg | CHEWABLE_TABLET | Freq: Two times a day (BID) | ORAL | Status: DC
  Administered 2018-11-02 – 2018-11-05 (×7): 1250 mg via ORAL
  Filled 2018-11-01 (×6): qty 7

## 2018-11-01 MED ORDER — NALOXONE HCL 2 MG/2ML IJ SOSY
0.4000 mg | PREFILLED_SYRINGE | Freq: Once | INTRAMUSCULAR | Status: AC
  Administered 2018-11-01: 2 mg via INTRAVENOUS

## 2018-11-01 MED ORDER — LEVOTHYROXINE SODIUM 112 MCG PO TABS
112.0000 ug | ORAL_TABLET | Freq: Every day | ORAL | Status: DC
  Administered 2018-11-02 – 2018-11-05 (×4): 112 ug via ORAL
  Filled 2018-11-01 (×5): qty 1

## 2018-11-01 MED ORDER — ASPIRIN 81 MG PO CHEW
81.0000 mg | CHEWABLE_TABLET | Freq: Every day | ORAL | Status: DC
  Administered 2018-11-02 – 2018-11-05 (×4): 81 mg via ORAL
  Filled 2018-11-01 (×4): qty 1

## 2018-11-01 MED ORDER — SODIUM CHLORIDE 0.9 % IV SOLN
INTRAVENOUS | Status: DC
  Administered 2018-11-01 – 2018-11-03 (×2): via INTRAVENOUS

## 2018-11-01 MED ORDER — VITAMIN B-12 1000 MCG PO TABS
500.0000 ug | ORAL_TABLET | Freq: Every day | ORAL | Status: DC
  Administered 2018-11-02 – 2018-11-05 (×4): 500 ug via ORAL
  Filled 2018-11-01 (×4): qty 1

## 2018-11-01 MED ORDER — ACETAMINOPHEN 650 MG RE SUPP: 650.0000 mg | Freq: Four times a day (QID) | RECTAL | Status: DC | PRN

## 2018-11-01 MED ORDER — ONDANSETRON HCL 4 MG PO TABS: 4.0000 mg | ORAL_TABLET | Freq: Four times a day (QID) | ORAL | Status: DC | PRN

## 2018-11-01 MED ORDER — POLYETHYLENE GLYCOL 3350 17 G PO PACK
17.0000 g | PACK | Freq: Every day | ORAL | Status: DC
  Administered 2018-11-02 – 2018-11-05 (×3): 17 g via ORAL
  Filled 2018-11-01 (×3): qty 1

## 2018-11-01 MED ORDER — IRBESARTAN 150 MG PO TABS
75.0000 mg | ORAL_TABLET | Freq: Every day | ORAL | Status: DC
  Administered 2018-11-02 – 2018-11-03 (×2): 75 mg via ORAL
  Filled 2018-11-01 (×2): qty 1

## 2018-11-01 MED ORDER — ATORVASTATIN CALCIUM 20 MG PO TABS
20.0000 mg | ORAL_TABLET | Freq: Every day | ORAL | Status: DC
  Administered 2018-11-02 – 2018-11-04 (×4): 20 mg via ORAL
  Filled 2018-11-01 (×4): qty 1

## 2018-11-01 MED ORDER — MEMANTINE HCL 5 MG PO TABS
10.0000 mg | ORAL_TABLET | Freq: Two times a day (BID) | ORAL | Status: DC
  Administered 2018-11-02 – 2018-11-05 (×8): 10 mg via ORAL
  Filled 2018-11-01 (×8): qty 2

## 2018-11-01 NOTE — ED Notes (Signed)
This RN spoke with Med Tech at [facility] to inquire about onset of symptoms.  Per facility, pt with multiple episodes over the last two weeks where she becomes unarousable.  Todays episode being noticed a short time after morning meds and breakfast.  EDP made aware.

## 2018-11-01 NOTE — ED Notes (Signed)
Patient transported to CT 

## 2018-11-01 NOTE — ED Provider Notes (Addendum)
[facility] Emergency Department Provider Note   ____________________________________________   First MD Initiated Contact with Patient 11/01/18 1237     (approximate)  I have reviewed the triage vital signs and the nursing notes.   HISTORY  Chief Complaint Altered Mental Status  Chief complaint is altered mental status    History limited by altered mental status and dementia  HPI Cathy Johnson is a 85 y.o. female who has been having intermittent decreases in mental status for about 2 weeks is been getting worse for last few days.  Happened today again.  Patient came to the hospital.  On arrival in the emergency room patient is very somnolent but arousable.  She has not completely oriented when she wakes up but she does have a history of dementia.  She falls asleep again almost immediately.  Pupils are small.  I gave the patient 1 amp of Narcan slowly with absolutely no effect.  EMS reports CBG was over 100.   Past Medical History:  Diagnosis Date  . Arthritis   . Dementia ([place])   . Hypertension   . Osteoarthritis of right knee 05/02/2014  . PFO (patent foramen ovale)    PFO by bubble study 02/05/11 TEE  . Stroke [facility])     Patient Active Problem List   Diagnosis Date Noted  . CVA (cerebral vascular accident) ([place]) 09/18/2018  . Elevated alkaline phosphatase level 08/06/2018  . Hypothyroidism 08/06/2018  . AMS (altered mental status) 08/05/2018  . Acute CVA (cerebrovascular accident) ([place]) 08/05/2018  . Altered mental status 07/21/2018  . Facial droop 03/06/2017  . AKI (acute kidney injury) ([place]) 01/06/2016  . Syncope 01/06/2016  . Dementia ([place]) 01/06/2016  . Hypertension 01/06/2016  . Anemia 01/06/2016  . Osteoarthritis of right knee 05/02/2014  . Knee osteoarthritis 05/02/2014    Past Surgical History:  Procedure Laterality Date  . BREAST SURGERY     LT BREAST MASS REMOVED  . CARPAL TUNNEL RELEASE    . KNEE ARTHROSCOPY     RIGHT    . PARTIAL KNEE ARTHROPLASTY Right 05/02/2014   Procedure: UNICOMPARTMENTAL KNEE;  Surgeon: Johnny Bridge, MD;  Location: [place];  Service: Orthopedics;  Laterality: Right;    Prior to Admission medications   Medication Sig Start Date End Date Taking? Authorizing Provider  acetaminophen (TYLENOL) 500 MG tablet Take 1,000 mg by mouth 2 (two) times daily.    [provider]  aspirin 81 MG chewable tablet Chew 81 mg by mouth daily.    [provider]  atorvastatin (LIPITOR) 20 MG tablet Take 20 mg by mouth at bedtime.     [provider]  bisacodyl (FLEET) 10 MG/30ML ENEM Place 10 mg rectally as needed (for constipation/if no relief or abdominal pain, CALL MD).     [provider]  calcium carbonate (CALCI-CHEW) 1250 (500 Ca) MG chewable tablet Chew 1 tablet by mouth 2 (two) times daily.    [provider]  Cholecalciferol (VITAMIN D3) 2000 units TABS Take 2,000 Units by mouth daily.    [provider]  cloNIDine (CATAPRES) 0.1 MG tablet Take 0.1 mg by mouth 2 (two) times daily.    [provider]  clopidogrel (PLAVIX) 75 MG tablet Take 1 tablet (75 mg total) by mouth daily. 03/09/17   Gladstone Lighter, MD  diclofenac sodium (VOLTAREN) 1 % GEL Apply 4 g topically See admin instructions. Apply 4 grams to both knees two times a day and an additional 4 grams  to both knees two times a day as needed for arthritis pain    [provider]  ferrous sulfate 325 (65 FE) MG tablet Take 325 mg by mouth daily with breakfast.    [provider]  guaifenesin (ROBITUSSIN) 100 MG/5ML syrup Take 200 mg by mouth every 6 (six) hours as needed (for 3 days, for coughing and notify MD if coughs persists for more than 3 days).     [provider]  levothyroxine (SYNTHROID, LEVOTHROID) 112 MCG tablet Take 112 mcg by mouth daily before breakfast.    [provider]  lisinopril (PRINIVIL,ZESTRIL) 40 MG tablet Take 40 mg by mouth  daily.    [provider]  loperamide (IMODIUM A-D) 2 MG tablet Take 2 mg by mouth See admin instructions. Take 2 mg by mouth after each loose stool as needed/max humber of doses, up to 3/12 hours and notify MD if no relief after 24 hrs    [provider]  LORazepam (ATIVAN) 1 MG tablet Take 1 mg by mouth 2 (two) times daily.    [provider]  memantine (NAMENDA) 10 MG tablet Take 10 mg by mouth 2 (two) times daily.    [provider]  polyethylene glycol (MIRALAX / GLYCOLAX) packet Take 17 g by mouth daily.    [provider]  risperiDONE (RISPERDAL) 0.5 MG tablet Take 0.5 mg by mouth See admin instructions. Take 0.5 mg by mouth two times a day- at 2 PM and 8 PM    [provider]  sertraline (ZOLOFT) 50 MG tablet Take 75 mg by mouth daily.     [provider]  telmisartan (MICARDIS) 80 MG tablet Take 80 mg by mouth daily.    [provider]  vitamin B-12 (CYANOCOBALAMIN) 500 MCG tablet Take 500 mcg by mouth daily.     [provider]    Allergies Boniva [ibandronic acid]; Crestor [rosuvastatin calcium]; Exelon [rivastigmine tartrate]; Fosamax [alendronate sodium]; Galantamine; Lipitor [atorvastatin]; Morphine and related; Other; and Vagifem [estradiol]  Family History  Problem Relation Age of Onset  . Hypertension Mother   . Hypertension Father     Social History Social History   Tobacco Use  . Smoking status: Never Smoker  . Smokeless tobacco: Never Used  Substance Use Topics  . Alcohol use: No  . Drug use: No    Review of Systems  Able to obtain ____________________________________________   PHYSICAL EXAM:  VITAL SIGNS: ED Triage Vitals  Enc Vitals Group     BP      Pulse      Resp      Temp      Temp src      SpO2      Weight      Height      Head Circumference      Peak Flow      Pain Score      Pain Loc      Pain Edu?      Excl. in [place]?    Constitutional: Sleepy but  arousable does not appear to be in any distress Eyes: Conjunctivae are normal.  Nipples are equal round and reactive but very small. EOMI. Head: Atraumatic. Nose: No congestion/rhinnorhea. Mouth/Throat: Mucous membranes are moist.  Oropharynx non-erythematous. Neck: No stridor Cardiovascular: Normal rate, regular rhythm. Grossly normal heart sounds.  Good peripheral circulation. Respiratory: Normal respiratory effort.  No retractions. Lungs CTAB. Gastrointestinal: Soft and nontender. No distention. No abdominal bruits. No CVA tenderness.  Musculoskeletal: No lower extremity tenderness nor edema.  Neurologic: Speech is normal.  Patient is able to move all of her extremities and does follow commands.  She gets lost doing finger-to-nose however.  She does not seem to have any ataxia Skin:  Skin is warm, dry and intact.  ____________________________________________   LABS (all labs ordered are listed, but only abnormal results are displayed)  Labs Reviewed  COMPREHENSIVE METABOLIC PANEL - Abnormal; Notable for the following components:      Result Value   Glucose, Bld 113 (*)    BUN 30 (*)    Creatinine, Ser 1.41 (*)    Total Protein 6.4 (*)    Alkaline Phosphatase 127 (*)    GFR calc non Af Amer 33 (*)    GFR calc Af Amer 38 (*)    All other components within normal limits  TROPONIN I - Abnormal; Notable for the following components:   Troponin I 0.04 (*)    All other components within normal limits  BRAIN NATRIURETIC PEPTIDE - Abnormal; Notable for the following components:   B Natriuretic Peptide 161.0 (*)    All other components within normal limits  ETHANOL  ACETAMINOPHEN LEVEL  LACTIC ACID, PLASMA  CBC WITH DIFFERENTIAL/PLATELET  AMMONIA  BLOOD GAS, VENOUS  LACTIC ACID, PLASMA  URINALYSIS, COMPLETE (UACMP) WITH MICROSCOPIC  URINE DRUG SCREEN, QUALITATIVE ([facility] ONLY)   ____________________________________________  EKG   EKG read interpreted by me shows normal sinus  rhythm rate of 62 normal axis essentially normal EKG slightly decreased R wave progression which may be due to lead placement ____________________________________________  RADIOLOGY  ED MD interpretation:    Official radiology report(s): Dg Chest 2 View  Result Date: 11/01/2018 CLINICAL DATA:  Generalized weakness and fatigue. EXAM: CHEST - 2 VIEW COMPARISON:  August 05, 2018 FINDINGS: The heart size is borderline to mildly enlarged but stable. The hila and mediastinum are normal. No pneumothorax. No nodules or masses. No focal infiltrates identified. IMPRESSION: No active cardiopulmonary disease. Electronically Signed   By: Dorise Bullion III M.D   On: 11/01/2018 13:56   Ct Head Wo Contrast  Result Date: 11/01/2018 CLINICAL DATA:  Unresponsive.  Constricted pupils. EXAM: CT HEAD WITHOUT CONTRAST TECHNIQUE: Contiguous axial images were obtained from the base of the skull through the vertex without intravenous contrast. COMPARISON:  CT head dated September 18, 2018. FINDINGS: Brain: No evidence of acute infarction, hemorrhage, hydrocephalus, extra-axial collection or mass lesion/mass effect. Unchanged chronic lacunar infarcts in the bilateral thalami. Stable moderate atrophy and chronic microvascular ischemic changes. Vascular: Atherosclerotic vascular calcification of the carotid siphons. No hyperdense vessel. Skull: Negative for fracture or focal lesion. Sinuses/Orbits: No acute finding. Other: None. IMPRESSION: 1.  No acute intracranial abnormality. 2. Stable moderate atrophy and chronic microvascular ischemic changes. Electronically Signed   By: Titus Dubin M.D.   On: 11/01/2018 13:20    ____________________________________________   PROCEDURES  Procedure(s) performed:   Procedures  Critical Care performed:   ____________________________________________   INITIAL IMPRESSION / ASSESSMENT AND PLAN / ED COURSE  Patient son arrives.  Patient obviously knows him but cannot say who  he is cannot find the word for son or husband or brother all of which she has said in the past when she sees him before.  Her words are hesitant and somewhat slurry as well which is somewhat unusual for her.  sHe may be having a TIA or stroke perhaps.  We will get a MRI.  The CT was negative.  She will have to come in 1 where the other anyway.       ____________________________________________   FINAL CLINICAL IMPRESSION(S) / ED DIAGNOSES  Final diagnoses:  Altered mental status, unspecified altered mental status type     ED Discharge Orders    None       Note:  This document was prepared using Dragon voice recognition software and may include unintentional dictation errors.    Nena Polio, MD 11/01/18 1511    Nena Polio, MD 11/01/18 [phone]

## 2018-11-01 NOTE — ED Notes (Signed)
Pt is awake and alert at this time, pt remains disoriented w/ incomprehensible speech.  Unsure of pt's basline speech.

## 2018-11-01 NOTE — ED Notes (Signed)
Pt pulled out iv in lac.  Iv restarted in rac.  Tolerated well.

## 2018-11-01 NOTE — ED Triage Notes (Signed)
Pt in via [facility] from [facility].  Per EMS, pt with decreased LOC since this morning.  Pt arousable to sternal rub only upon arrival.  EDP to bedside.

## 2018-11-01 NOTE — ED Notes (Signed)
Patient transported to X-ray 

## 2018-11-01 NOTE — ED Notes (Signed)
Pt waiting on admission bed.  Family with pt.  

## 2018-11-01 NOTE — ED Notes (Signed)
Report called to Karen Chafe floor nurse

## 2018-11-01 NOTE — ED Notes (Signed)
VBG sent to lab. RT notified.  

## 2018-11-01 NOTE — ED Notes (Signed)
Resumed care from [facility].  Pt alert.  Family with pt.   Iv in place

## 2018-11-01 NOTE — ED Notes (Signed)
Pt in mri 

## 2018-11-01 NOTE — H&P (Addendum)
[place] [place] NAME: Cathy Johnson    MR#:  [id number]  DATE OF BIRTH:  December 02, 1927  DATE OF ADMISSION:  11/01/2018  PRIMARY CARE PHYSICIAN: [facility]   REQUESTING/REFERRING PHYSICIAN: Nena Polio, MD  CHIEF COMPLAINT:   Chief Complaint  Patient presents with  . Altered Mental Status    HISTORY OF PRESENT ILLNESS: Cathy Johnson  is a 85 y.o. female with a known history of dementia who lives in the dementia ward, arthritis, hypertension, osteoarthritis of right knee and a previous stroke who has had few admissions for similar type of presentation.  Who was brought into the emergency room with intermittent changes in mental status.  And had a similar episode today.  Patient when she initially arrived was somnolent but arousable now she is awake her son is at the bedside he reports a similar type of episodes have happened and she has been admitted for possible TIA and stroke work-up.  MRI has been negative in the past.   PAST MEDICAL HISTORY:   Past Medical History:  Diagnosis Date  . Arthritis   . Dementia ([place])   . Hypertension   . Osteoarthritis of right knee 05/02/2014  . PFO (patent foramen ovale)    PFO by bubble study 02/05/11 TEE  . Stroke [facility])     PAST SURGICAL HISTORY:  Past Surgical History:  Procedure Laterality Date  . BREAST SURGERY     LT BREAST MASS REMOVED  . CARPAL TUNNEL RELEASE    . KNEE ARTHROSCOPY     RIGHT  . PARTIAL KNEE ARTHROPLASTY Right 05/02/2014   Procedure: UNICOMPARTMENTAL KNEE;  Surgeon: Johnny Bridge, MD;  Location: [place];  Service: Orthopedics;  Laterality: Right;    SOCIAL HISTORY:  Social History   Tobacco Use  . Smoking status: Never Smoker  . Smokeless tobacco: Never Used  Substance Use Topics  . Alcohol use: No    FAMILY HISTORY:  Family History  Problem Relation Age of Onset  . Hypertension Mother   . Hypertension Father     DRUG ALLERGIES:  Allergies   Allergen Reactions  . Boniva [Ibandronic Acid] Other (See Comments)    "Allergic," per MAR  . Crestor [Rosuvastatin Calcium] Other (See Comments)    "Allergic," per MAR  . Exelon [Rivastigmine Tartrate] Other (See Comments)    "Allergic," per MAR  . Fosamax [Alendronate Sodium] Other (See Comments)    "Allergic," per MAR  . Galantamine Other (See Comments)    "Allergic," per MAR  . Lipitor [Atorvastatin] Other (See Comments)    "Allergic," per MAR  . Morphine And Related Other (See Comments)    "Allergic," per MAR  . Other Other (See Comments)    "Sedatives" and "Multiple, unknown B/P meds" = "Allergic," per MAR  . Vagifem [Estradiol] Other (See Comments)    "Allergic," per MAR    REVIEW OF SYSTEMS:   CONSTITUTIONAL: Unable to provide due to her mental status.   MEDICATIONS AT HOME:  Prior to Admission medications   Medication Sig Start Date End Date Taking? Authorizing Provider  acetaminophen (TYLENOL) 500 MG tablet Take 1,000 mg by mouth 2 (two) times daily.    [provider]  aspirin 81 MG chewable tablet Chew 81 mg by mouth daily.    [provider]  atorvastatin (LIPITOR) 20 MG tablet Take 20 mg by mouth at bedtime.     [provider]  bisacodyl (FLEET) 10 MG/30ML ENEM  Place 10 mg rectally as needed (for constipation/if no relief or abdominal pain, CALL MD).     [provider]  calcium carbonate (CALCI-CHEW) 1250 (500 Ca) MG chewable tablet Chew 1 tablet by mouth 2 (two) times daily.    [provider]  Cholecalciferol (VITAMIN D3) 2000 units TABS Take 2,000 Units by mouth daily.    [provider]  cloNIDine (CATAPRES) 0.1 MG tablet Take 0.1 mg by mouth 2 (two) times daily.    [provider]  clopidogrel (PLAVIX) 75 MG tablet Take 1 tablet (75 mg total) by mouth daily. 03/09/17   Gladstone Lighter, MD  diclofenac sodium (VOLTAREN) 1 % GEL Apply 4 g topically See admin instructions. Apply 4 grams to both  knees two times a day and an additional 4 grams to both knees two times a day as needed for arthritis pain    [provider]  ferrous sulfate 325 (65 FE) MG tablet Take 325 mg by mouth daily with breakfast.    [provider]  guaifenesin (ROBITUSSIN) 100 MG/5ML syrup Take 200 mg by mouth every 6 (six) hours as needed (for 3 days, for coughing and notify MD if coughs persists for more than 3 days).     [provider]  levothyroxine (SYNTHROID, LEVOTHROID) 112 MCG tablet Take 112 mcg by mouth daily before breakfast.    [provider]  lisinopril (PRINIVIL,ZESTRIL) 40 MG tablet Take 40 mg by mouth daily.    [provider]  loperamide (IMODIUM A-D) 2 MG tablet Take 2 mg by mouth See admin instructions. Take 2 mg by mouth after each loose stool as needed/max humber of doses, up to 3/12 hours and notify MD if no relief after 24 hrs    [provider]  LORazepam (ATIVAN) 1 MG tablet Take 1 mg by mouth 2 (two) times daily.    [provider]  memantine (NAMENDA) 10 MG tablet Take 10 mg by mouth 2 (two) times daily.    [provider]  polyethylene glycol (MIRALAX / GLYCOLAX) packet Take 17 g by mouth daily.    [provider]  risperiDONE (RISPERDAL) 0.5 MG tablet Take 0.5 mg by mouth See admin instructions. Take 0.5 mg by mouth two times a day- at 2 PM and 8 PM    [provider]  sertraline (ZOLOFT) 50 MG tablet Take 75 mg by mouth daily.     [provider]  telmisartan (MICARDIS) 80 MG tablet Take 80 mg by mouth daily.    [provider]  vitamin B-12 (CYANOCOBALAMIN) 500 MCG tablet Take 500 mcg by mouth daily.     [provider]      PHYSICAL EXAMINATION:   VITAL SIGNS: Blood pressure (!) 182/76, pulse 66, temperature 97.8 F (36.6 C), temperature source Axillary, resp. rate (!) 36, height 5\' 4"  (1.626 m), weight 72 kg, SpO2 95 %.  GENERAL:  85 y.o.-year-old patient lying in  the bed with no acute distress.  EYES: Pupils equal, round, reactive to light and accommodation. No scleral icterus. Extraocular muscles intact.  HEENT: Head atraumatic, normocephalic. Oropharynx and nasopharynx clear.  NECK:  Supple, no jugular venous distention. No thyroid enlargement, no tenderness.  LUNGS: Normal breath sounds bilaterally, no wheezing, rales,rhonchi or crepitation. No use of accessory muscles of respiration.  CARDIOVASCULAR: S1, S2 normal. No murmurs, rubs, or gallops.  ABDOMEN: Soft, nontender, nondistended. Bowel sounds present. No organomegaly or mass.  EXTREMITIES: No pedal edema, cyanosis, or clubbing.  NEUROLOGIC: No focal deficit noted PSYCHIATRIC: Patient is alert but not oriented sKIN: No obvious rash, lesion, or ulcer.   LABORATORY PANEL:   CBC Recent Labs  Lab 11/01/18 1310  WBC 10.4  HGB 12.2  HCT 38.3  PLT 267  MCV 92.5  MCH 29.5  MCHC 31.9  RDW 14.5  LYMPHSABS 1.7  MONOABS 0.9  EOSABS 0.3  BASOSABS 0.0   ------------------------------------------------------------------------------------------------------------------  Chemistries  Recent Labs  Lab 11/01/18 1310  NA 138  K 4.3  CL 105  CO2 25  GLUCOSE 113*  BUN 30*  CREATININE 1.41*  CALCIUM 9.2  AST 23  ALT 37  ALKPHOS 127*  BILITOT 0.6   ------------------------------------------------------------------------------------------------------------------ estimated creatinine clearance is 26.8 mL/min (A) (by C-G formula based on SCr of 1.41 mg/dL (H)). ------------------------------------------------------------------------------------------------------------------ No results for input(s): TSH, T4TOTAL, T3FREE, THYROIDAB in the last 72 hours.  Invalid input(s): FREET3   Coagulation profile No results for input(s): INR, PROTIME in the last 168 hours. ------------------------------------------------------------------------------------------------------------------- No results  for input(s): DDIMER in the last 72 hours. -------------------------------------------------------------------------------------------------------------------  Cardiac Enzymes Recent Labs  Lab 11/01/18 1310  TROPONINI 0.04*   ------------------------------------------------------------------------------------------------------------------ Invalid input(s): POCBNP  ---------------------------------------------------------------------------------------------------------------  Urinalysis    Component Value Date/Time   COLORURINE YELLOW (A) 09/17/2018 0924   APPEARANCEUR CLEAR (A) 09/17/2018 0924   LABSPEC 1.023 09/17/2018 0924   PHURINE 5.0 09/17/2018 0924   GLUCOSEU NEGATIVE 09/17/2018 0924   HGBUR NEGATIVE 09/17/2018 0924   BILIRUBINUR NEGATIVE 09/17/2018 0924   KETONESUR NEGATIVE 09/17/2018 0924   PROTEINUR NEGATIVE 09/17/2018 0924   UROBILINOGEN 0.2 02/01/2011 1932   NITRITE NEGATIVE 09/17/2018 0924   LEUKOCYTESUR NEGATIVE 09/17/2018 0924     RADIOLOGY: Dg Chest 2 View  Result Date: 11/01/2018 CLINICAL DATA:  Generalized weakness and fatigue. EXAM: CHEST - 2 VIEW COMPARISON:  August 05, 2018 FINDINGS: The heart size is borderline to mildly enlarged but stable. The hila and mediastinum are normal. No pneumothorax. No nodules or masses. No focal infiltrates identified. IMPRESSION: No active cardiopulmonary disease. Electronically Signed   By: Dorise Bullion III M.D   On: 11/01/2018 13:56   Ct Head Wo Contrast  Result Date: 11/01/2018 CLINICAL DATA:  Unresponsive.  Constricted pupils. EXAM: CT HEAD WITHOUT CONTRAST TECHNIQUE: Contiguous axial images were obtained from the base of the skull through the vertex without intravenous contrast. COMPARISON:  CT head dated September 18, 2018. FINDINGS: Brain: No evidence of acute infarction, hemorrhage, hydrocephalus, extra-axial collection or mass lesion/mass effect. Unchanged chronic lacunar infarcts in the bilateral thalami. Stable  moderate atrophy and chronic microvascular ischemic changes. Vascular: Atherosclerotic vascular calcification of the carotid siphons. No hyperdense vessel. Skull: Negative for fracture or focal lesion. Sinuses/Orbits: No acute finding. Other: None. IMPRESSION: 1.  No acute intracranial abnormality. 2. Stable moderate atrophy and chronic microvascular ischemic changes. Electronically Signed   By: Titus Dubin M.D.   On: 11/01/2018 13:20    EKG: Orders placed or performed during the hospital encounter of 11/01/18  . ED EKG  . ED EKG  . EKG 12-Lead  . EKG 12-Lead    IMPRESSION AND PLAN: Patient is 85 year old being admitted with episodes of decrease in responsiveness and hard to arouse episode  1.  Decreasing responsiveness which is interment ,  these episodes does not appear to be TIA I will asked neurology to see Will obtain a EEG Patient may be having seizures MRI of the brain is ordered which is currently pending  2.  Dementia we will continue her home medication  3.  Hyperlipidemia continue Lipitor  4.  Accelerated hypertension continue Catapres, micardis,  I will add IV hydralazine.  5.  Dementia continue Namenda, Ativan   6.  Miscellaneous Lovenox for DVT prophylaxis    All the records are reviewed and case discussed with ED provider. Management plans discussed with the patient, family and they are in agreement.  CODE STATUS: Code Status History    Date Active Date Inactive Code Status Order ID Comments User Context   09/17/2018 1447 09/20/2018 2125 DNR [id number]  Fritzi Mandes, MD Inpatient   08/05/2018 2352 08/06/2018 2122 DNR [id number]  Shela Leff, MD ED   07/21/2018 0511 07/22/2018 2013 DNR [id number]  Arta Silence, [place] Inpatient   03/06/2017 1354 03/08/2017 1552 Full Code [id number]  Epifanio Lesches, MD ED   01/06/2016 1341 01/07/2016 1601 Full Code [id number]  Samella Parr, NP ED   05/02/2014 1411 05/04/2014 1458 Full Code [id number]  Johnny Bridge,  MD Inpatient    Questions for Most Recent Historical Code Status (Order [id number])    Question Answer Comment   In the event of cardiac or respiratory ARREST Do not call a "code blue"    In the event of cardiac or respiratory ARREST Do not perform Intubation, CPR, defibrillation or ACLS    In the event of cardiac or respiratory ARREST Use medication by any route, position, wound care, and other measures to relive pain and suffering. May use oxygen, suction and manual treatment of airway obstruction as needed for comfort.         Advance Directive Documentation     Most Recent Value  Type of Advance Directive  Out of facility DNR (pink MOST or yellow form)  Pre-existing out of facility DNR order (yellow form or pink MOST form)  Yellow form placed in chart (order not valid for inpatient use)  "MOST" Form in Place?  -       TOTAL TIME TAKING CARE OF THIS PATIENT: 55 minutes.    Dustin Flock M.D on 11/01/2018 at 3:41 PM  Between 7am to 6pm - Pager - [phone]  After 6pm go to [email or web address] - Proofreader  [employer] Office  [phone]  CC: Primary care physician; [facility]

## 2018-11-01 NOTE — Progress Notes (Signed)
Advanced care plan.  Purpose of the Encounter: CODE STATUS  Parties in Attendance: Son and patient    patient's Decision Capacity: Not intact  Subjective/Patient's story: Cathy Johnson  is a 85 y.o. female with a known history of dementia who lives in the dementia ward, arthritis, hypertension, osteoarthritis of right knee and a previous stroke who has had few admissions for similar type of presentation.   Objective/Medical story  I discussed with the son regarding overall poor prognosis and patient's CODE STATUS  Goals of care determination:   Is agreeable to DNR status  CODE STATUS:  DNR status  Time spent discussing advanced care planning: 16 minutes

## 2018-11-02 ENCOUNTER — Observation Stay: Payer: Medicare Other

## 2018-11-02 DIAGNOSIS — G9340 Encephalopathy, unspecified: Secondary | ICD-10-CM

## 2018-11-02 DIAGNOSIS — Z96651 Presence of right artificial knee joint: Secondary | ICD-10-CM | POA: Diagnosis present

## 2018-11-02 DIAGNOSIS — R4182 Altered mental status, unspecified: Secondary | ICD-10-CM | POA: Diagnosis present

## 2018-11-02 DIAGNOSIS — E785 Hyperlipidemia, unspecified: Secondary | ICD-10-CM | POA: Diagnosis present

## 2018-11-02 DIAGNOSIS — D638 Anemia in other chronic diseases classified elsewhere: Secondary | ICD-10-CM | POA: Diagnosis present

## 2018-11-02 DIAGNOSIS — Z8249 Family history of ischemic heart disease and other diseases of the circulatory system: Secondary | ICD-10-CM | POA: Diagnosis not present

## 2018-11-02 DIAGNOSIS — I10 Essential (primary) hypertension: Secondary | ICD-10-CM | POA: Diagnosis present

## 2018-11-02 DIAGNOSIS — Z885 Allergy status to narcotic agent status: Secondary | ICD-10-CM | POA: Diagnosis not present

## 2018-11-02 DIAGNOSIS — Z66 Do not resuscitate: Secondary | ICD-10-CM | POA: Diagnosis present

## 2018-11-02 DIAGNOSIS — E039 Hypothyroidism, unspecified: Secondary | ICD-10-CM | POA: Diagnosis present

## 2018-11-02 DIAGNOSIS — Z79890 Hormone replacement therapy (postmenopausal): Secondary | ICD-10-CM | POA: Diagnosis not present

## 2018-11-02 DIAGNOSIS — Z8673 Personal history of transient ischemic attack (TIA), and cerebral infarction without residual deficits: Secondary | ICD-10-CM | POA: Diagnosis not present

## 2018-11-02 DIAGNOSIS — Z888 Allergy status to other drugs, medicaments and biological substances status: Secondary | ICD-10-CM | POA: Diagnosis not present

## 2018-11-02 DIAGNOSIS — Z7982 Long term (current) use of aspirin: Secondary | ICD-10-CM | POA: Diagnosis not present

## 2018-11-02 DIAGNOSIS — F0390 Unspecified dementia without behavioral disturbance: Secondary | ICD-10-CM | POA: Diagnosis present

## 2018-11-02 DIAGNOSIS — G9341 Metabolic encephalopathy: Secondary | ICD-10-CM | POA: Diagnosis present

## 2018-11-02 DIAGNOSIS — Z79899 Other long term (current) drug therapy: Secondary | ICD-10-CM | POA: Diagnosis not present

## 2018-11-02 DIAGNOSIS — Z7902 Long term (current) use of antithrombotics/antiplatelets: Secondary | ICD-10-CM | POA: Diagnosis not present

## 2018-11-02 LAB — CBC
HCT: 37.8 % (ref 36.0–46.0)
Hemoglobin: 12 g/dL (ref 12.0–15.0)
MCH: 29.1 pg (ref 26.0–34.0)
MCHC: 31.7 g/dL (ref 30.0–36.0)
MCV: 91.5 fL (ref 80.0–100.0)
NRBC: 0 % (ref 0.0–0.2)
Platelets: 230 10*3/uL (ref 150–400)
RBC: 4.13 MIL/uL (ref 3.87–5.11)
RDW: 14.4 % (ref 11.5–15.5)
WBC: 8.4 10*3/uL (ref 4.0–10.5)

## 2018-11-02 LAB — URINALYSIS, COMPLETE (UACMP) WITH MICROSCOPIC
Bilirubin Urine: NEGATIVE
Glucose, UA: NEGATIVE mg/dL
Hgb urine dipstick: NEGATIVE
Ketones, ur: NEGATIVE mg/dL
Leukocytes, UA: NEGATIVE
Nitrite: NEGATIVE
Protein, ur: NEGATIVE mg/dL
Specific Gravity, Urine: 1.011 (ref 1.005–1.030)
pH: 6 (ref 5.0–8.0)

## 2018-11-02 LAB — URINE DRUG SCREEN, QUALITATIVE (ARMC ONLY)
Amphetamines, Ur Screen: NOT DETECTED
Barbiturates, Ur Screen: NOT DETECTED
Benzodiazepine, Ur Scrn: POSITIVE — AB
CANNABINOID 50 NG, UR ~~LOC~~: NOT DETECTED
Cocaine Metabolite,Ur ~~LOC~~: NOT DETECTED
MDMA (Ecstasy)Ur Screen: NOT DETECTED
Methadone Scn, Ur: NOT DETECTED
Opiate, Ur Screen: NOT DETECTED
PHENCYCLIDINE (PCP) UR S: NOT DETECTED
Tricyclic, Ur Screen: NOT DETECTED

## 2018-11-02 LAB — BASIC METABOLIC PANEL
ANION GAP: 7 (ref 5–15)
BUN: 22 mg/dL (ref 8–23)
CO2: 24 mmol/L (ref 22–32)
Calcium: 8.8 mg/dL — ABNORMAL LOW (ref 8.9–10.3)
Chloride: 109 mmol/L (ref 98–111)
Creatinine, Ser: 0.79 mg/dL (ref 0.44–1.00)
GFR calc Af Amer: >60 mL/min (ref >60)
GFR calc non Af Amer: >60 mL/min (ref >60)
Glucose, Bld: 104 mg/dL — ABNORMAL HIGH (ref 70–99)
Potassium: 3.7 mmol/L (ref 3.5–5.1)
Sodium: 140 mmol/L (ref 135–145)

## 2018-11-02 LAB — MRSA PCR SCREENING: MRSA by PCR: NEGATIVE

## 2018-11-02 MED ORDER — ENOXAPARIN SODIUM 40 MG/0.4ML ~~LOC~~ SOLN
40.0000 mg | SUBCUTANEOUS | Status: DC
  Administered 2018-11-02 – 2018-11-05 (×3): 40 mg via SUBCUTANEOUS
  Filled 2018-11-02 (×3): qty 0.4

## 2018-11-02 MED ORDER — CLONIDINE HCL 0.1 MG PO TABS
0.3000 mg | ORAL_TABLET | Freq: Two times a day (BID) | ORAL | Status: DC
  Administered 2018-11-02 – 2018-11-05 (×6): 0.3 mg via ORAL
  Filled 2018-11-02 (×6): qty 3

## 2018-11-02 NOTE — NC FL2 (Signed)
[place] LEVEL OF CARE SCREENING TOOL     IDENTIFICATION  Patient Name: Cathy Johnson Birthdate: Mar 04, 1928 Sex: female Admission Date (Current Location): 11/01/2018  [place] and [place] Number:  Engineering geologist and Address:  [facility], [address] [place],  [address]      Provider Number: [id number]  Attending Physician Name and Address:  Henreitta Leber, MD  Relative Name and Phone Number:       Current Level of Care: Hospital Recommended Level of Care: [place] Prior Approval Number:    Date Approved/Denied:   PASRR Number: [id number]  Discharge Plan: SNF    Current Diagnoses: Patient Active Problem List   Diagnosis Date Noted  . Acute encephalopathy 11/01/2018  . CVA (cerebral vascular accident) ([place]) 09/18/2018  . Elevated alkaline phosphatase level 08/06/2018  . Hypothyroidism 08/06/2018  . AMS (altered mental status) 08/05/2018  . Acute CVA (cerebrovascular accident) ([place]) 08/05/2018  . Altered mental status 07/21/2018  . Facial droop 03/06/2017  . AKI (acute kidney injury) ([place]) 01/06/2016  . Syncope 01/06/2016  . Dementia ([place]) 01/06/2016  . Hypertension 01/06/2016  . Anemia 01/06/2016  . Osteoarthritis of right knee 05/02/2014  . Knee osteoarthritis 05/02/2014    Orientation RESPIRATION BLADDER Height & Weight     Self  Normal Incontinent Weight: 158 lb 11.7 oz (72 kg) Height:  5\' 4"  (162.6 cm)  BEHAVIORAL SYMPTOMS/MOOD NEUROLOGICAL BOWEL NUTRITION STATUS  (none) (none) Incontinent Diet(Heart Healthy )  AMBULATORY STATUS COMMUNICATION OF NEEDS Skin   Extensive Assist Verbally Normal                       Personal Care Assistance Level of Assistance  Bathing, Feeding, Dressing Bathing Assistance: Limited assistance Feeding assistance: Independent Dressing Assistance: Limited assistance     Functional Limitations Info  Sight, Hearing, Speech Sight Info:  Adequate Hearing Info: Adequate Speech Info: Adequate    SPECIAL CARE FACTORS FREQUENCY  PT (By licensed PT), OT (By licensed OT)     PT Frequency: 5 OT Frequency: 5            Contractures Contractures Info: Not present    Additional Factors Info  Code Status, Allergies Code Status Info: DNR Allergies Info: Boniva, Crestor, Exelon, Fosamax, Galantamine, Lipitor, Vagifem, Morphine and related            Current Medications (11/02/2018):  This is the current hospital active medication list Current Facility-Administered Medications  Medication Dose Route Frequency Provider Last Rate Last Dose  . 0.9 %  sodium chloride infusion   Intravenous Continuous Dustin Flock, MD 75 mL/hr at 11/02/18 0152    . acetaminophen (TYLENOL) tablet 650 mg  650 mg Oral Q6H PRN Dustin Flock, MD       Or  . acetaminophen (TYLENOL) suppository 650 mg  650 mg Rectal Q6H PRN Dustin Flock, MD      . acetaminophen (TYLENOL) tablet 1,000 mg  1,000 mg Oral BID Dustin Flock, MD   1,000 mg at 11/02/18 0015  . aspirin chewable tablet 81 mg  81 mg Oral Daily Dustin Flock, MD   81 mg at 11/02/18 1420  . atorvastatin (LIPITOR) tablet 20 mg  20 mg Oral QHS Dustin Flock, MD   20 mg at 11/02/18 0015  . calcium carbonate (TUMS - dosed in mg elemental calcium) chewable tablet 1,250 mg  1,250 mg Oral BID Dustin Flock, MD   1,250 mg at 11/02/18 0016  .  cholecalciferol (VITAMIN D3) tablet 2,000 Units  2,000 Units Oral Daily Dustin Flock, MD      . cloNIDine (CATAPRES) tablet 0.3 mg  0.3 mg Oral BID Henreitta Leber, MD      . clopidogrel (PLAVIX) tablet 75 mg  75 mg Oral Daily Dustin Flock, MD      . enoxaparin (LOVENOX) injection 40 mg  40 mg Subcutaneous Q24H Sainani, Vivek J, MD      . ferrous sulfate tablet 325 mg  325 mg Oral Q breakfast Dustin Flock, MD   325 mg at 11/02/18 1420  . irbesartan (AVAPRO) tablet 75 mg  75 mg Oral Daily Dustin Flock, MD   75 mg at 11/02/18 1420  .  levothyroxine (SYNTHROID, LEVOTHROID) tablet 112 mcg  112 mcg Oral QAC breakfast Dustin Flock, MD      . lisinopril (PRINIVIL,ZESTRIL) tablet 40 mg  40 mg Oral Daily Dustin Flock, MD      . loperamide (IMODIUM) capsule 2 mg  2 mg Oral PRN Dustin Flock, MD      . LORazepam (ATIVAN) tablet 1 mg  1 mg Oral BID Dustin Flock, MD   1 mg at 11/02/18 0014  . memantine (NAMENDA) tablet 10 mg  10 mg Oral BID Dustin Flock, MD   10 mg at 11/02/18 0014  . ondansetron (ZOFRAN) tablet 4 mg  4 mg Oral Q6H PRN Dustin Flock, MD       Or  . ondansetron (ZOFRAN) injection 4 mg  4 mg Intravenous Q6H PRN Dustin Flock, MD      . polyethylene glycol (MIRALAX / GLYCOLAX) packet 17 g  17 g Oral Daily Dustin Flock, MD   17 g at 11/02/18 1419  . risperiDONE (RISPERDAL) tablet 0.5 mg  0.5 mg Oral BID Dustin Flock, MD   0.5 mg at 11/02/18 0015  . sertraline (ZOLOFT) tablet 75 mg  75 mg Oral Daily Dustin Flock, MD      . vitamin B-12 (CYANOCOBALAMIN) tablet 500 mcg  500 mcg Oral Daily Dustin Flock, MD         Discharge Medications: Please see discharge summary for a list of discharge medications.  Relevant Imaging Results:  Relevant Lab Results:   Additional Information SSN: [id number]  Annamaria Boots, [place]

## 2018-11-02 NOTE — Procedures (Signed)
History: 85 year old female being evaluated for encephalopathy  Sedation: None  Technique: This is a 21 channel routine scalp EEG performed at the bedside with bipolar and monopolar montages arranged in accordance to the international 10/20 system of electrode placement. One channel was dedicated to EKG recording.    Background: There is a posterior dominant rhythm of 8 to 9 Hz which is fairly well sustained.  There is some irregular delta and theta intrusion into the background even during the maximal wakeful state.  With drowsiness, this increases, but sleep is not recorded.  There are no epileptiform discharges seen.  Photic stimulation: Physiologic driving is present  EEG Abnormalities: 1) mild diffuse irregular slow activity  Clinical Interpretation: This EEG is consistent with a mild generalized nonspecific cerebral dysfunction (encephalopathy).  There was no seizure or seizure predisposition recorded on this study. Please note that lack of epileptiform activity on EEG does not preclude the possibility of epilepsy.   Roland Rack, MD [facility] [phone]  If 7pm- 7am, please page neurology on call as listed in [place].

## 2018-11-02 NOTE — Care Management Note (Signed)
Case Management Note  Patient Details  Name: Cathy Johnson MRN: [id number] Date of Birth: 1928/08/24  Subjective/Objective:                 Patient is placed in observation from [facility] for unresponsiveness.  She has had several presentations for similar sx.  Is DNR. Patient's functional status has been on a steady decline and son has been looking at facilities for higher level of care.  Cathy Johnson will have to assess patient before she would be allowed to return. She is currently not receiving any services.  Was closed to [facility] in December 2019. Son Cathy Johnson is patient's guardian. Guardianship papers are present in media tab for 5.21.2018  Action/Plan:  Requested current medication listed to be faxed   Expected Discharge Date:  11/04/18               Expected Discharge Plan:     In-House Referral:     Discharge planning Services     Post Acute Care Choice:    Choice offered to:     DME Arranged:    DME Agency:     HH Arranged:    [place] Agency:     Status of Service:     If discussed at [employer], dates discussed:    Additional Comments:  Katrina Stack, RN 11/02/2018, 11:15 AM

## 2018-11-02 NOTE — Clinical Social Work Note (Signed)
Clinical Social Work Assessment  Patient Details  Name: Cathy Johnson MRN: [id number] Date of Birth: 1928-02-05  Date of referral:  11/02/18               Reason for consult:  Facility Placement                Permission sought to share information with:  Case Manager, Customer service manager, Family Supports Permission granted to share information::  Yes, Verbal Permission Granted  Name::      SNF  Agency::   [facility]   Relationship::     Contact Information:     Housing/Transportation Living arrangements for the past 2 months:  [place] of Information:  Adult Children Patient Interpreter Needed:  None Criminal Activity/Legal Involvement Pertinent to Current Situation/Hospitalization:  No - Comment as needed Significant Relationships:  Adult Children Lives with:  Facility Resident Do you feel safe going back to the place where you live?  Yes Need for family participation in patient care:  Yes (Comment)  Care giving concerns:  Patient lives at [facility] in memory care.    Social Worker assessment / plan:  CSW consulted for facility placement. CSW met with patient's son Micayla Brathwaite at bedside. Son states that patient has lived at [facility] for years. Son states that patient has been declining recently and is now requiring 2 person assist for transfers. Son reports that patient is usually wheelchair bound and he pays privately for sitters. Per son, he has been looking for a higher level of care for patient. He says that patient has a long term care policy that will assist with payment for long term care and he also is prepared to pay privately. Son would like to see what options are available in [facility] for patient. CSW explained that long term care beds are harder to find and may not be many offers. Son states understanding. He also states that if patient is unable to go to SNF at discharge, then he will take patient back to [facility]  until a facility can be arranged. CSW will begin bed search for patient. CSW will follow for discharge planning.   Employment status:  Retired Forensic scientist:  Medicare PT Recommendations:  Not assessed at this time [place] / Referral to community resources:  [place]  Patient/Family's Response to care:  Son thanked CSW for assistance. Patient is confused.   Patient/Family's Understanding of and Emotional Response to Diagnosis, Current Treatment, and Prognosis:  Son is aware of treatment plan and in agreement.   Emotional Assessment Appearance:  Appears stated age Attitude/Demeanor/Rapport:  Unresponsive Affect (typically observed):  Quiet Orientation:  Oriented to Self Alcohol / Substance use:  Not Applicable Psych involvement (Current and /or in the community):  No (Comment)  Discharge Needs  Concerns to be addressed:  Discharge Planning Concerns Readmission within the last 30 days:  No Current discharge risk:  None Barriers to Discharge:  Continued Medical Work up   [employer], [place] 11/02/2018, 11:31 AM

## 2018-11-02 NOTE — Care Management Obs Status (Signed)
[place] NOTIFICATION   Patient Details  Name: Cathy Johnson MRN: [id number] Date of Birth: 06-08-1928   Medicare Observation Status Notification Given:  Yes    Katrina Stack, RN 11/02/2018, 11:10 AM

## 2018-11-02 NOTE — Progress Notes (Signed)
eeg completed ° °

## 2018-11-02 NOTE — Progress Notes (Signed)
Please note, patient is currently followed by outpatient Palliative at [facility]. CSW Annamaria Boots made aware.  Flo Shanks RN, BSN, [place] [facility] liaison [phone]

## 2018-11-02 NOTE — Progress Notes (Signed)
[place] at [place] NAME: Cathy Johnson    MR#:  [id number]  DATE OF BIRTH:  December 12, 1927  SUBJECTIVE:   Presented to the hospital due to altered mental status.  Patient has underlying dementia and therefore has no complaints and is nonverbal presently.  REVIEW OF SYSTEMS:    Review of Systems  Unable to perform ROS: Dementia    Nutrition: Heart Healthy Tolerating Diet: Yes Tolerating PT: Await Eval   DRUG ALLERGIES:   Allergies  Allergen Reactions  . Boniva [Ibandronic Acid] Other (See Comments)    "Allergic," per MAR  . Crestor [Rosuvastatin Calcium] Other (See Comments)    "Allergic," per MAR  . Exelon [Rivastigmine Tartrate] Other (See Comments)    "Allergic," per MAR  . Fosamax [Alendronate Sodium] Other (See Comments)    "Allergic," per MAR  . Galantamine Other (See Comments)    "Allergic," per MAR  . Lipitor [Atorvastatin] Other (See Comments)    "Allergic," per MAR  . Morphine And Related Other (See Comments)    "Allergic," per MAR  . Other Other (See Comments)    "Sedatives" and "Multiple, unknown B/P meds" = "Allergic," per MAR  . Vagifem [Estradiol] Other (See Comments)    "Allergic," per MAR    VITALS:  Blood pressure (!) 172/101, pulse (!) 59, temperature 98.1 F (36.7 C), resp. rate 20, height 5\' 4"  (1.626 m), weight 72 kg, SpO2 94 %.  PHYSICAL EXAMINATION:   Physical Exam  GENERAL:  85 y.o.-year-old patient lying in bed nonverbal but in NAD.  EYES: Pupils equal, round, reactive to light and accommodation. No scleral icterus. Extraocular muscles intact.  HEENT: Head atraumatic, normocephalic. Oropharynx and nasopharynx clear.  NECK:  Supple, no jugular venous distention. No thyroid enlargement, no tenderness.  LUNGS: Normal breath sounds bilaterally, no wheezing, rales, rhonchi. No use of accessory muscles of respiration.  CARDIOVASCULAR: S1, S2 normal. No murmurs, rubs, or gallops.  ABDOMEN:  Soft, nontender, nondistended. Bowel sounds present. No organomegaly or mass.  EXTREMITIES: No cyanosis, clubbing or edema b/l.    NEUROLOGIC: Cranial nerves II through XII are intact. No focal Motor or sensory deficits b/l.  Globally weak.  PSYCHIATRIC: The patient is alert and oriented x 1 SKIN: No obvious rash, lesion, or ulcer.    LABORATORY PANEL:   CBC Recent Labs  Lab 11/02/18 0517  WBC 8.4  HGB 12.0  HCT 37.8  PLT 230   ------------------------------------------------------------------------------------------------------------------  Chemistries  Recent Labs  Lab 11/01/18 1310 11/02/18 0517  NA 138 140  K 4.3 3.7  CL 105 109  CO2 25 24  GLUCOSE 113* 104*  BUN 30* 22  CREATININE 1.41* 0.79  CALCIUM 9.2 8.8*  AST 23  --   ALT 37  --   ALKPHOS 127*  --   BILITOT 0.6  --    ------------------------------------------------------------------------------------------------------------------  Cardiac Enzymes Recent Labs  Lab 11/01/18 1310  TROPONINI 0.04*   ------------------------------------------------------------------------------------------------------------------  RADIOLOGY:  Dg Chest 2 View  Result Date: 11/01/2018 CLINICAL DATA:  Generalized weakness and fatigue. EXAM: CHEST - 2 VIEW COMPARISON:  August 05, 2018 FINDINGS: The heart size is borderline to mildly enlarged but stable. The hila and mediastinum are normal. No pneumothorax. No nodules or masses. No focal infiltrates identified. IMPRESSION: No active cardiopulmonary disease. Electronically Signed   By: Dorise Bullion III M.D   On: 11/01/2018 13:56   Ct Head Wo Contrast  Result Date: 11/01/2018 CLINICAL DATA:  Unresponsive.  Constricted pupils. EXAM: CT HEAD WITHOUT CONTRAST TECHNIQUE: Contiguous axial images were obtained from the base of the skull through the vertex without intravenous contrast. COMPARISON:  CT head dated September 18, 2018. FINDINGS: Brain: No evidence of acute infarction,  hemorrhage, hydrocephalus, extra-axial collection or mass lesion/mass effect. Unchanged chronic lacunar infarcts in the bilateral thalami. Stable moderate atrophy and chronic microvascular ischemic changes. Vascular: Atherosclerotic vascular calcification of the carotid siphons. No hyperdense vessel. Skull: Negative for fracture or focal lesion. Sinuses/Orbits: No acute finding. Other: None. IMPRESSION: 1.  No acute intracranial abnormality. 2. Stable moderate atrophy and chronic microvascular ischemic changes. Electronically Signed   By: Titus Dubin M.D.   On: 11/01/2018 13:20   Mr Brain Wo Contrast  Result Date: 11/01/2018 CLINICAL DATA:  Altered mental status EXAM: MRI HEAD WITHOUT CONTRAST TECHNIQUE: Multiplanar, multiecho pulse sequences of the brain and surrounding structures were obtained without intravenous contrast. COMPARISON:  Head CT 11/01/2018 Brain MRI 09/17/2018 FINDINGS: BRAIN: Foci of abnormal hyperintensity on diffusion-weighted imaging in the paramedian midbrain are unchanged compared to 09/17/2018. The midline structures are normal. No midline shift or other mass effect. There are areas of encephalomalacia at the site of old infarcts within both thalami. Multifocal white matter hyperintensity, most commonly due to chronic ischemic microangiopathy. Generalized atrophy without lobar predilection. Susceptibility-sensitive sequences show no chronic microhemorrhage or superficial siderosis. VASCULAR: Major intracranial arterial and venous sinus flow voids are normal. SKULL AND UPPER CERVICAL SPINE: Calvarial bone marrow signal is normal. There is no skull base mass. Visualized upper cervical spine and soft tissues are normal. SINUSES/ORBITS: No fluid levels or advanced mucosal thickening. No mastoid or middle ear effusion. There are bilateral lens replacements. IMPRESSION: 1. No acute intracranial abnormality. 2. Residual hyperintensity on diffusion-weighted imaging within the paramedian  midbrain, unchanged since 09/17/2018 and consistent with T2 shine through at the site of late subacute/early chronic infarct. 3. Generalized volume loss and findings of chronic ischemic microangiopathy. Electronically Signed   By: Ulyses Jarred M.D.   On: 11/01/2018 20:59     ASSESSMENT AND PLAN:   85 year old female with past medical history of dementia, history of previous CVA, osteoarthritis, hypertension who presented to the hospital due to worsening altered mental status.  1.  Altered mental status- etiology unclear but suspected to be secondary to progressive dementia. - Patient apparently has had episodes of decreased responsiveness which has been intermittent in nature.  There is some concern for possible underlying seizures but patient has had no tonic-clonic seizure type activity. -CT head, MRI of the brain shows no acute pathology bilateral and old strokes.  Seen by neurology and plan for EEG today.  Continue supportive care for now.  Avoid benzodiazepines and further delirogenic meds.   2.  History of previous CVA-continue aspirin, Plavix, atorvastatin.  3.  Dementia-continue Namenda. -Continue Risperdal  4.  Hypothyroidism-continue Synthroid.  5.  Essential hypertension-continue clonidine, lisinopril, Avapro.    6. Depression - cont. Zoloft.     All the records are reviewed and case discussed with Care Management/Social Worker. Management plans discussed with the patient, family and they are in agreement.  CODE STATUS: DNR  DVT Prophylaxis: Lovenox  TOTAL TIME TAKING CARE OF THIS PATIENT: 30 minutes.   POSSIBLE D/C IN 1-2 DAYS, DEPENDING ON CLINICAL CONDITION.   Henreitta Leber M.D on 11/02/2018 at 2:16 PM  Between 7am to 6pm - Pager - [phone]  After 6pm go to [email or web address] - Proofreader  [employer]  [phone]  CC: Primary care physician; [place]

## 2018-11-02 NOTE — Consult Note (Addendum)
Reason for Consult: Altered Mental status Referring Physician: Abel Presto MD  CC: Confusion  HPI: Cathy Johnson is an 85 y.o. female with past medical history of medial left thalamic infarct, left posterior frontal gyrus and left parietal gyrus infarcts, TIA, mixed dementia, chronic anemia, hypothyroidism, hypertension, and hyperlipidemia presenting to the ED from [facility] with episode of unresponsiveness.  Per [facility] staff patient has had multiple episodes over the last 2 weeks where she comes unresponsive without loss of consciousness.  Today's episode was noticed short time after her morning breakfast and medication.  Per [facility] staff patient was only arousable to deep sternal rub they therefore decided to call EMS due to concerns of possible stroke or seizures.  No other associated symptoms reported of syncope, cranial nerve deficit, loss of consciousness, focal motor or sensory deficit or other associated neurologic deficit.  On arrival to the ED she was noted to be somnolent but arousable.  Patient would localize to sternal rub, however unable to follow commands.  Narcan was administered in the ED with no improvement in mental status noted.  Initial CT head was negative with no acute intracranial abnormality noted.  Chest x-ray showed no active cardiopulmonary disease.  Follow-up MRI of the Johnson also showed no acute intracranial abnormality.  Labs revealed TSH 3.282, normal blood gas, ammonia 21, normal CBC, lactic acid 1.0, slightly elevated BNP 161, slightly elevated troponin levels of 0.04, ethanol 10mg /dl, blood glucose 113, BUN 30, creatinine 1.41, alkaline phosphate 127, decreased renal function, urinalysis negative for UTI.  Patient was therefore admitted for further work-up and management.  Past Medical History:  Diagnosis Date  . Arthritis   . Dementia ([place])   . Hypertension   . Osteoarthritis of right knee 05/02/2014  . PFO (patent foramen ovale)    PFO by bubble  study 02/05/11 TEE  . Stroke [facility])     Past Surgical History:  Procedure Laterality Date  . BREAST SURGERY     LT BREAST MASS REMOVED  . CARPAL TUNNEL RELEASE    . KNEE ARTHROSCOPY     RIGHT  . PARTIAL KNEE ARTHROPLASTY Right 05/02/2014   Procedure: UNICOMPARTMENTAL KNEE;  Surgeon: Johnny Bridge, MD;  Location: [place];  Service: Orthopedics;  Laterality: Right;    Family History  Problem Relation Age of Onset  . Hypertension Mother   . Hypertension Father     Social History:  reports that she has never smoked. She has never used smokeless tobacco. She reports that she does not drink alcohol or use drugs.  Allergies  Allergen Reactions  . Boniva [Ibandronic Acid] Other (See Comments)    "Allergic," per MAR  . Crestor [Rosuvastatin Calcium] Other (See Comments)    "Allergic," per MAR  . Exelon [Rivastigmine Tartrate] Other (See Comments)    "Allergic," per MAR  . Fosamax [Alendronate Sodium] Other (See Comments)    "Allergic," per MAR  . Galantamine Other (See Comments)    "Allergic," per MAR  . Lipitor [Atorvastatin] Other (See Comments)    "Allergic," per MAR  . Morphine And Related Other (See Comments)    "Allergic," per MAR  . Other Other (See Comments)    "Sedatives" and "Multiple, unknown B/P meds" = "Allergic," per MAR  . Vagifem [Estradiol] Other (See Comments)    "Allergic," per MAR    Medications:  I have reviewed the patient's current medications. Prior to Admission:  Medications Prior to Admission  Medication Sig Dispense Refill Last Dose  .  acetaminophen (TYLENOL) 500 MG tablet Take 1,000 mg by mouth 2 (two) times daily.   10/31/2018 at 0900  . aspirin 81 MG chewable tablet Chew 81 mg by mouth daily.   10/31/2018 at 0900  . atorvastatin (LIPITOR) 20 MG tablet Take 20 mg by mouth at bedtime.    10/31/2018 at 2000  . calcium carbonate (CALCI-CHEW) 1250 (500 Ca) MG chewable tablet Chew 1 tablet by mouth 2 (two) times daily.   10/31/2018 at 0900  .  Cholecalciferol (VITAMIN D3) 2000 units TABS Take 2,000 Units by mouth daily.   10/31/2018 at 0900  . cloNIDine (CATAPRES) 0.3 MG tablet Take 0.3 mg by mouth 2 (two) times daily.    10/31/2018 at 0900  . clopidogrel (PLAVIX) 75 MG tablet Take 1 tablet (75 mg total) by mouth daily. 30 tablet 1 10/31/2018 at 0900  . diclofenac sodium (VOLTAREN) 1 % GEL Apply 4 g topically See admin instructions. Apply 4 grams to both knees two times a day and an additional 4 grams to both knees two times a day as needed for arthritis pain   10/31/2018 at 0700  . ferrous sulfate 325 (65 FE) MG tablet Take 325 mg by mouth daily with breakfast.   10/31/2018 at 0900  . guaifenesin (ROBITUSSIN) 100 MG/5ML syrup Take 200 mg by mouth every 6 (six) hours as needed (for 3 days, for coughing and notify MD if coughs persists for more than 3 days).    prn at prn  . levothyroxine (SYNTHROID, LEVOTHROID) 112 MCG tablet Take 112 mcg by mouth daily before breakfast.   10/31/2018 at 0900  . lisinopril (PRINIVIL,ZESTRIL) 40 MG tablet Take 40 mg by mouth daily.   10/31/2018 at 0900  . LORazepam (ATIVAN) 1 MG tablet Take 1 mg by mouth 2 (two) times daily.   10/31/2018 at 1400  . memantine (NAMENDA) 10 MG tablet Take 10 mg by mouth 2 (two) times daily.   10/31/2018 at 0900  . polyethylene glycol (MIRALAX / GLYCOLAX) packet Take 17 g by mouth daily.   10/31/2018 at 0900  . risperiDONE (RISPERDAL) 0.5 MG tablet Take 0.5 mg by mouth 2 (two) times daily.    10/31/2018 at 1400  . sertraline (ZOLOFT) 50 MG tablet Take 75 mg by mouth daily.    10/31/2018 at 0900  . telmisartan (MICARDIS) 80 MG tablet Take 80 mg by mouth daily.   10/31/2018 at 0900  . vitamin B-12 (CYANOCOBALAMIN) 500 MCG tablet Take 500 mcg by mouth daily.    10/31/2018 at 0900  . loperamide (IMODIUM A-D) 2 MG tablet Take 2 mg by mouth See admin instructions. Take 2 mg by mouth after each loose stool as needed/max humber of doses, up to 3/12 hours and notify MD if no relief after 24 hrs   Not  Taking at Unknown time   Scheduled: . acetaminophen  1,000 mg Oral BID  . aspirin  81 mg Oral Daily  . atorvastatin  20 mg Oral QHS  . calcium carbonate  1,250 mg Oral BID  . cholecalciferol  2,000 Units Oral Daily  . cloNIDine  0.3 mg Oral BID  . clopidogrel  75 mg Oral Daily  . enoxaparin (LOVENOX) injection  40 mg Subcutaneous Q24H  . ferrous sulfate  325 mg Oral Q breakfast  . irbesartan  75 mg Oral Daily  . levothyroxine  112 mcg Oral QAC breakfast  . lisinopril  40 mg Oral Daily  . LORazepam  1 mg Oral BID  . memantine  10 mg Oral BID  . polyethylene glycol  17 g Oral Daily  . risperiDONE  0.5 mg Oral BID  . sertraline  75 mg Oral Daily  . vitamin B-12  500 mcg Oral Daily    ROS: Unable to obtain from patient due to current altered mental status.  Physical Examination: Blood pressure (!) 172/101, pulse (!) 59, temperature 98.1 F (36.7 C), resp. rate 20, height 5\' 4"  (1.626 m), weight 72 kg, SpO2 94 %.  HEENT-  Normocephalic, no lesions, without obvious abnormality.  Normal external eye and conjunctiva.  Normal TM's bilaterally.  Normal auditory canals and external ears. Normal external nose, mucus membranes and septum.  Normal pharynx. Cardiovascular- S1, S2 normal, pulses palpable throughout   Lungs- chest clear, no wheezing, rales, normal symmetric air entry Abdomen- soft, non-tender; bowel sounds normal; no masses,  no organomegaly Extremities- no edema Lymph-no adenopathy palpable Musculoskeletal-no joint tenderness, deformity or swelling Skin-warm and dry, no hyperpigmentation, vitiligo, or suspicious lesions  Neurological Exam   Mental Status: Alert, awake but disoriented to time, place and person.  Speech is mostly incomprehensible and mumbled.  Able to follow simple commands.   Cranial Nerves: II: Discs flat bilaterally; blinks to visual threats more on the left than the right. pupils equal, round, reactive to light and accommodation III,IV, VI: ptosis not  present, does not follow gaze V,VII: Mild right lower facial asymmetry. facial light touch sensation intact VIII: hearing normal bilaterally IX,X: gag reflex present XI: bilateral shoulder shrug XII: midline tongue extension Motor: Able to move all 4 extremities against gravity with mild weakness in bilateral proximal lower extremities.  Some trace action tremor of both upper extremities with asterixis bilaterally Tone and bulk:normal tone throughout; no atrophy noted Sensory: Withdraws extremities to noxious stimuli Deep Tendon Reflexes: 2+ and symmetric throughout Plantars: Right:  Downgoing                             Left: Downgoing  Cerebellar: Unable to assess reliably Gait: not tested due to safety concerns  Data Reviewed  Laboratory Studies:   Basic Metabolic Panel: Recent Labs  Lab 11/01/18 1310 11/02/18 0517  NA 138 140  K 4.3 3.7  CL 105 109  CO2 25 24  GLUCOSE 113* 104*  BUN 30* 22  CREATININE 1.41* 0.79  CALCIUM 9.2 8.8*    Liver Function Tests: Recent Labs  Lab 11/01/18 1310  AST 23  ALT 37  ALKPHOS 127*  BILITOT 0.6  PROT 6.4*  ALBUMIN 3.8   No results for input(s): LIPASE, AMYLASE in the last 168 hours. Recent Labs  Lab 11/01/18 1310  AMMONIA 21    CBC: Recent Labs  Lab 11/01/18 1310 11/02/18 0517  WBC 10.4 8.4  NEUTROABS 7.5  --   HGB 12.2 12.0  HCT 38.3 37.8  MCV 92.5 91.5  PLT 267 230    Cardiac Enzymes: Recent Labs  Lab 11/01/18 1310  TROPONINI 0.04*    BNP: Invalid input(s): POCBNP  CBG: No results for input(s): GLUCAP in the last 168 hours.  Microbiology: Results for orders placed or performed during the hospital encounter of 11/01/18  MRSA PCR Screening     Status: None   Collection Time: 11/01/18 11:28 PM  Result Value Ref Range Status   MRSA by PCR NEGATIVE NEGATIVE Final    Comment:        The GeneXpert MRSA Assay (FDA approved for NASAL specimens only), is  one component of a comprehensive MRSA  colonization surveillance program. It is not intended to diagnose MRSA infection nor to guide or monitor treatment for MRSA infections. Performed at [facility], [address] [place], [place] [address]     Coagulation Studies: No results for input(s): LABPROT, INR in the last 72 hours.  Urinalysis:  Recent Labs  Lab 11/02/18 0629  COLORURINE YELLOW*  LABSPEC 1.011  PHURINE 6.0  GLUCOSEU NEGATIVE  HGBUR NEGATIVE  BILIRUBINUR NEGATIVE  KETONESUR NEGATIVE  PROTEINUR NEGATIVE  NITRITE NEGATIVE  LEUKOCYTESUR NEGATIVE    Lipid Panel:     Component Value Date/Time   CHOL 128 09/18/2018 0551   TRIG 93 09/18/2018 0551   HDL 36 (L) 09/18/2018 0551   CHOLHDL 3.6 09/18/2018 0551   VLDL 19 09/18/2018 0551   LDLCALC 73 09/18/2018 0551    HgbA1C:  Lab Results  Component Value Date   HGBA1C 5.2 09/18/2018    Urine Drug Screen:      Component Value Date/Time   LABOPIA NONE DETECTED 11/02/2018 0629   COCAINSCRNUR NONE DETECTED 11/02/2018 0629   LABBENZ POSITIVE (A) 11/02/2018 0629   AMPHETMU NONE DETECTED 11/02/2018 0629   THCU NONE DETECTED 11/02/2018 0629   LABBARB NONE DETECTED 11/02/2018 0629    Alcohol Level:  Recent Labs  Lab 11/01/18 1310  ETH <10    Other results: EKG: normal EKG, normal sinus rhythm, unchanged from previous tracings.  Imaging: Dg Chest 2 View  Result Date: 11/01/2018 CLINICAL DATA:  Generalized weakness and fatigue. EXAM: CHEST - 2 VIEW COMPARISON:  August 05, 2018 FINDINGS: The heart size is borderline to mildly enlarged but stable. The hila and mediastinum are normal. No pneumothorax. No nodules or masses. No focal infiltrates identified. IMPRESSION: No active cardiopulmonary disease. Electronically Signed   By: Dorise Bullion III M.D   On: 11/01/2018 13:56   Ct Head Wo Contrast  Result Date: 11/01/2018 CLINICAL DATA:  Unresponsive.  Constricted pupils. EXAM: CT HEAD WITHOUT CONTRAST TECHNIQUE: Contiguous axial images  were obtained from the base of the skull through the vertex without intravenous contrast. COMPARISON:  CT head dated September 18, 2018. FINDINGS: Johnson: No evidence of acute infarction, hemorrhage, hydrocephalus, extra-axial collection or mass lesion/mass effect. Unchanged chronic lacunar infarcts in the bilateral thalami. Stable moderate atrophy and chronic microvascular ischemic changes. Vascular: Atherosclerotic vascular calcification of the carotid siphons. No hyperdense vessel. Skull: Negative for fracture or focal lesion. Sinuses/Orbits: No acute finding. Other: None. IMPRESSION: 1.  No acute intracranial abnormality. 2. Stable moderate atrophy and chronic microvascular ischemic changes. Electronically Signed   By: Titus Dubin M.D.   On: 11/01/2018 13:20   Cathy Johnson Wo Contrast  Result Date: 11/01/2018 CLINICAL DATA:  Altered mental status EXAM: MRI HEAD WITHOUT CONTRAST TECHNIQUE: Multiplanar, multiecho pulse sequences of the Johnson and surrounding structures were obtained without intravenous contrast. COMPARISON:  Head CT 11/01/2018 Johnson MRI 09/17/2018 FINDINGS: Johnson: Foci of abnormal hyperintensity on diffusion-weighted imaging in the paramedian midbrain are unchanged compared to 09/17/2018. The midline structures are normal. No midline shift or other mass effect. There are areas of encephalomalacia at the site of old infarcts within both thalami. Multifocal white matter hyperintensity, most commonly due to chronic ischemic microangiopathy. Generalized atrophy without lobar predilection. Susceptibility-sensitive sequences show no chronic microhemorrhage or superficial siderosis. VASCULAR: Major intracranial arterial and venous sinus flow voids are normal. SKULL AND UPPER CERVICAL SPINE: Calvarial bone marrow signal is normal. There is no skull base mass. Visualized upper cervical spine  and soft tissues are normal. SINUSES/ORBITS: No fluid levels or advanced mucosal thickening. No mastoid or middle  ear effusion. There are bilateral lens replacements. IMPRESSION: 1. No acute intracranial abnormality. 2. Residual hyperintensity on diffusion-weighted imaging within the paramedian midbrain, unchanged since 09/17/2018 and consistent with T2 shine through at the site of late subacute/early chronic infarct. 3. Generalized volume loss and findings of chronic ischemic microangiopathy. Electronically Signed   By: Ulyses Jarred M.D.   On: 11/01/2018 20:59   Assessment: 85 y.o female with past medical history of medial left thalamic infarct, left posterior frontal gyrus and left parietal gyrus infarcts, TIA, mixed dementia, chronic anemia, hypothyroidism, hypertension, and hyperlipidemia presenting to the ED from [facility] with episode of unresponsiveness.  MRI of the Johnson reviewed and shows no acute intracranial abnormality to explain symptoms.  Chest x-ray negative for active cardiopulmonary disease.  Labs revealed TSH 3.282, normal blood gas, ammonia 21, normal CBC, lactic acid 1.0, slightly elevated BNP 161, slightly elevated troponin levels of 0.04, ethanol 10mg /dl, blood glucose 113, BUN 30, creatinine 1.41, alkaline phosphate 127, decreased renal function, urinalysis negative for UTI. Etiology likely toxic or metabolic encephalopathy or infection in the setting of decreased renal function from baseline in a patient with underlying dementia. Overall presentation of altered mental status not likely to be a stroke without lateralizing abnormalities or focal neurologic deficit. Will rule out seizures as well.  Recommendations:  1. EEG 2. Agree with current management of underlying medical condition 3. Avoid sedatives/benzodiazepines due to risk of paradoxical worsening of mental status in a patient with a history of underlying dementia.  This patient was staffed with Dr. Arnaldo Natal who personally evaluated patient, reviewed documentation and agreed with assessment and plan of care as  above.  Rufina Falco, DNP, FNP-BC Board certified Nurse Practitioner Neurology Department    11/02/2018, 12:37 PM

## 2018-11-03 MED ORDER — IRBESARTAN 150 MG PO TABS
75.0000 mg | ORAL_TABLET | Freq: Once | ORAL | Status: AC
  Administered 2018-11-03: 18:00:00 75 mg via ORAL
  Filled 2018-11-03: qty 1

## 2018-11-03 MED ORDER — HYDRALAZINE HCL 50 MG PO TABS
25.0000 mg | ORAL_TABLET | Freq: Three times a day (TID) | ORAL | Status: DC
  Administered 2018-11-03 – 2018-11-05 (×6): 25 mg via ORAL
  Filled 2018-11-03 (×6): qty 1

## 2018-11-03 MED ORDER — IRBESARTAN 150 MG PO TABS
150.0000 mg | ORAL_TABLET | Freq: Every day | ORAL | Status: DC
  Administered 2018-11-04 – 2018-11-05 (×2): 150 mg via ORAL
  Filled 2018-11-03 (×2): qty 1

## 2018-11-03 MED ORDER — LORAZEPAM 0.5 MG PO TABS
0.5000 mg | ORAL_TABLET | Freq: Two times a day (BID) | ORAL | Status: DC | PRN
  Administered 2018-11-04: 0.5 mg via ORAL
  Filled 2018-11-03: qty 1

## 2018-11-03 MED ORDER — LORAZEPAM 0.5 MG PO TABS: 0.5000 mg | ORAL_TABLET | Freq: Two times a day (BID) | ORAL | Status: DC

## 2018-11-03 NOTE — Plan of Care (Signed)
  Problem: Education: Goal: Knowledge of General Education information will improve Description Including pain rating scale, medication(s)/side effects and non-pharmacologic comfort measures Outcome: Progressing   Problem: Health Behavior/Discharge Planning: Goal: Ability to manage health-related needs will improve Outcome: Progressing   

## 2018-11-03 NOTE — Progress Notes (Signed)
[place] at [place] NAME: Cathy Johnson    MR#:  [id number]  DATE OF BIRTH:  [birth year]  SUBJECTIVE:   Patient remains lethargic/encephalopathic after receiving some Ativan this morning.  No other acute events overnight.  REVIEW OF SYSTEMS:    Review of Systems  Unable to perform ROS: Dementia    Nutrition: Heart Healthy Tolerating Diet: Yes Tolerating PT: Await Eval   DRUG ALLERGIES:   Allergies  Allergen Reactions  . Boniva [Ibandronic Acid] Other (See Comments)    "Allergic," per MAR  . Crestor [Rosuvastatin Calcium] Other (See Comments)    "Allergic," per MAR  . Exelon [Rivastigmine Tartrate] Other (See Comments)    "Allergic," per MAR  . Fosamax [Alendronate Sodium] Other (See Comments)    "Allergic," per MAR  . Galantamine Other (See Comments)    "Allergic," per MAR  . Lipitor [Atorvastatin] Other (See Comments)    "Allergic," per MAR  . Morphine And Related Other (See Comments)    "Allergic," per MAR  . Other Other (See Comments)    "Sedatives" and "Multiple, unknown B/P meds" = "Allergic," per MAR  . Vagifem [Estradiol] Other (See Comments)    "Allergic," per MAR    VITALS:  Blood pressure (!) 196/81, pulse 66, temperature 97.6 F (36.4 C), temperature source Oral, resp. rate 20, height 5\' 4"  (1.626 m), weight 72 kg, SpO2 97 %.  PHYSICAL EXAMINATION:   Physical Exam  GENERAL:  85 y.o.-year-old patient lying in bed lethargic/encephalopathic EYES: Pupils equal, round, reactive to light. No scleral icterus. Extraocular muscles intact.  HEENT: Head atraumatic, normocephalic. Oropharynx and nasopharynx clear.  NECK:  Supple, no jugular venous distention. No thyroid enlargement, no tenderness.  LUNGS: Poor Resp. effort, no wheezing, rales, rhonchi. No use of accessory muscles of respiration.  CARDIOVASCULAR: S1, S2 normal. No murmurs, rubs, or gallops.  ABDOMEN: Soft, nontender, nondistended. Bowel sounds  present. No organomegaly or mass.  EXTREMITIES: No cyanosis, clubbing or edema b/l.    NEUROLOGIC: Cranial nerves II through XII are intact. No focal Motor or sensory deficits b/l.  Globally weak.  PSYCHIATRIC: The patient is alert and oriented x 1. Encephalopathic SKIN: No obvious rash, lesion, or ulcer.    LABORATORY PANEL:   CBC Recent Labs  Lab 11/02/18 0517  WBC 8.4  HGB 12.0  HCT 37.8  PLT 230   ------------------------------------------------------------------------------------------------------------------  Chemistries  Recent Labs  Lab 11/01/18 1310 11/02/18 0517  NA 138 140  K 4.3 3.7  CL 105 109  CO2 25 24  GLUCOSE 113* 104*  BUN 30* 22  CREATININE 1.41* 0.79  CALCIUM 9.2 8.8*  AST 23  --   ALT 37  --   ALKPHOS 127*  --   BILITOT 0.6  --    ------------------------------------------------------------------------------------------------------------------  Cardiac Enzymes Recent Labs  Lab 11/01/18 1310  TROPONINI 0.04*   ------------------------------------------------------------------------------------------------------------------  RADIOLOGY:  Mr Brain Wo Contrast  Result Date: 11/01/2018 CLINICAL DATA:  Altered mental status EXAM: MRI HEAD WITHOUT CONTRAST TECHNIQUE: Multiplanar, multiecho pulse sequences of the brain and surrounding structures were obtained without intravenous contrast. COMPARISON:  Head CT 11/01/2018 Brain MRI 09/17/2018 FINDINGS: BRAIN: Foci of abnormal hyperintensity on diffusion-weighted imaging in the paramedian midbrain are unchanged compared to 09/17/2018. The midline structures are normal. No midline shift or other mass effect. There are areas of encephalomalacia at the site of old infarcts within both thalami. Multifocal white matter hyperintensity, most commonly due to chronic ischemic microangiopathy. Generalized atrophy without  lobar predilection. Susceptibility-sensitive sequences show no chronic microhemorrhage or  superficial siderosis. VASCULAR: Major intracranial arterial and venous sinus flow voids are normal. SKULL AND UPPER CERVICAL SPINE: Calvarial bone marrow signal is normal. There is no skull base mass. Visualized upper cervical spine and soft tissues are normal. SINUSES/ORBITS: No fluid levels or advanced mucosal thickening. No mastoid or middle ear effusion. There are bilateral lens replacements. IMPRESSION: 1. No acute intracranial abnormality. 2. Residual hyperintensity on diffusion-weighted imaging within the paramedian midbrain, unchanged since 09/17/2018 and consistent with T2 shine through at the site of late subacute/early chronic infarct. 3. Generalized volume loss and findings of chronic ischemic microangiopathy. Electronically Signed   By: Ulyses Jarred M.D.   On: 11/01/2018 20:59     ASSESSMENT AND PLAN:   85 year old female with past medical history of dementia, history of previous CVA, osteoarthritis, hypertension who presented to the hospital due to worsening altered mental status.  1.  Altered mental status- etiology unclear but suspected to be secondary to progressive dementia. - Patient apparently has had episodes of decreased responsiveness which has been intermittent in nature.  There was some concern for possible underlying seizures but patient has had no tonic-clonic seizure type activity. -CT head, MRI of the brain shows no acute pathology bilateral and old strokes.  Seen by neurology and had EEG yesterday which was (-) for seizures.   - Continue supportive care for now.  Pt. Is on scheduled ativan and will lower dose for now and may need to taper off if tolerated as it's contributing to pt's AMS.   2.  History of previous CVA-continue aspirin, Plavix, atorvastatin.  3.  Dementia-continue Namenda. -Continue Risperdal  4.  Hypothyroidism-continue Synthroid.  5.  Essential hypertension-continue clonidine, lisinopril, Avapro.    6. Depression - cont. Zoloft.    Patient is  from an assisted living but likely needs a higher level of care.  Social worker aware.  All the records are reviewed and case discussed with Care Management/Social Worker. Management plans discussed with the patient, family and they are in agreement.  CODE STATUS: DNR  DVT Prophylaxis: Lovenox  TOTAL TIME TAKING CARE OF THIS PATIENT: 30 minutes.   POSSIBLE D/C in 2-3 , DEPENDING ON CLINICAL CONDITION and progress. Marland Kitchen   Henreitta Leber M.D on 11/03/2018 at 2:00 PM  Between 7am to 6pm - Pager - [phone]  After 6pm go to [email or web address] - [employer] [facility]  Office  [phone]  CC: Primary care physician; [facility]

## 2018-11-03 NOTE — Clinical Social Work Note (Signed)
CSW contacted by MD to speak with patient's family. CSW spoke with patient's son and fiance at bedside. Son is concerned that patient should return to [facility]. CSW contacted [facility] and spoke to staff at the cottage at [facility]. Per Gerald Stabs at [facility], they are unable to accept patient today because she has not been evaluated. Gerald Stabs states that the unit director Roselyn Reef has already left for the day and she would need to be contacted in the morning regarding patient. CSW will contact Roselyn Reef tomorrow morning regarding patient.   CSW explained above information to patient's son and fiance. CSW also explained that patient is now inpatient and if she needs a 3 night hospital stay that she would qualify for SNF rehab placement under Medicare A. Son and Domingo Mend would prefer patient return to [facility] due to Dementia and being in a familiar environment, but they understand that the evaluation has to be done first and [facility] has to agree. Son states that if [facility] refuses to take patient back, they would be interested in SNF placement. CSW notified MD of above and that patient is unable to discharge today. CSW will continue to follow for discharge planning.   [place], [place]

## 2018-11-04 MED ORDER — HALOPERIDOL LACTATE 5 MG/ML IJ SOLN: 2.0000 mg | Freq: Four times a day (QID) | INTRAMUSCULAR | Status: DC | PRN

## 2018-11-04 NOTE — Clinical Social Work Note (Signed)
CSW spoke with Roselyn Reef at [facility] regarding patient. Roselyn Reef came and evaluated patient for possible readmission. After evaluation, Roselyn Reef states that patient can not return to [facility] due to needing a lift. Currently patient is sleeping and not requiring a lift in hospital. Roselyn Reef did explain this to guardian (son) and fiance. CSW spoke with guardian after Roselyn Reef left. Son would like to send patient to rehab if possible. His preference is [name] CSW spoke with Olivia Mackie at [facility] regarding patient. Olivia Mackie states that patient can come under Medicare and could potentially transition to long term care if needed. CSW notified patient's son of this. CSW will continue to monitor for discharge planning.  [place], [place]

## 2018-11-04 NOTE — Clinical Social Work Note (Signed)
CSW has attempted to call [facility] three time this morning to speak with someone regarding patient. All three times CSW was transferred to difference voicemails that were not set up and unable to leave messages for anyone. CSW will continue to try and contact facility.   [place], [place]

## 2018-11-04 NOTE — Progress Notes (Signed)
[place] at [place] NAME: Cathy Johnson    MR#:  [id number]  DATE OF BIRTH:  1928-05-08  SUBJECTIVE:   Patient was agitated again this morning and received some Ativan and now is encephalopathic.  Patient's daughter-in-law is at bedside.  REVIEW OF SYSTEMS:    Review of Systems  Unable to perform ROS: Dementia    Nutrition: Heart Healthy Tolerating Diet: Yes Tolerating PT: Eval noted.    DRUG ALLERGIES:   Allergies  Allergen Reactions  . Boniva [Ibandronic Acid] Other (See Comments)    "Allergic," per MAR  . Crestor [Rosuvastatin Calcium] Other (See Comments)    "Allergic," per MAR  . Exelon [Rivastigmine Tartrate] Other (See Comments)    "Allergic," per MAR  . Fosamax [Alendronate Sodium] Other (See Comments)    "Allergic," per MAR  . Galantamine Other (See Comments)    "Allergic," per MAR  . Lipitor [Atorvastatin] Other (See Comments)    "Allergic," per MAR  . Morphine And Related Other (See Comments)    "Allergic," per MAR  . Other Other (See Comments)    "Sedatives" and "Multiple, unknown B/P meds" = "Allergic," per MAR  . Vagifem [Estradiol] Other (See Comments)    "Allergic," per MAR    VITALS:  Blood pressure (!) 148/86, pulse 65, temperature 98.1 F (36.7 C), temperature source Oral, resp. rate 15, height 5\' 4"  (1.626 m), weight 72 kg, SpO2 95 %.  PHYSICAL EXAMINATION:   Physical Exam  GENERAL:  85 y.o.-year-old patient lying in bed lethargic/encephalopathic EYES: Pupils equal, round, reactive to light. No scleral icterus. Extraocular muscles intact.  HEENT: Head atraumatic, normocephalic. Oropharynx and nasopharynx clear.  NECK:  Supple, no jugular venous distention. No thyroid enlargement, no tenderness.  LUNGS: Poor Resp. effort, no wheezing, rales, rhonchi. No use of accessory muscles of respiration.  CARDIOVASCULAR: S1, S2 normal. No murmurs, rubs, or gallops.  ABDOMEN: Soft, nontender, nondistended.  Bowel sounds present. No organomegaly or mass.  EXTREMITIES: No cyanosis, clubbing or edema b/l.    NEUROLOGIC: Cranial nerves II through XII are intact. No focal Motor or sensory deficits b/l.  Globally weak.  PSYCHIATRIC: The patient is alert and oriented x 1. Encephalopathic SKIN: No obvious rash, lesion, or ulcer.    LABORATORY PANEL:   CBC Recent Labs  Lab 11/02/18 0517  WBC 8.4  HGB 12.0  HCT 37.8  PLT 230   ------------------------------------------------------------------------------------------------------------------  Chemistries  Recent Labs  Lab 11/01/18 1310 11/02/18 0517  NA 138 140  K 4.3 3.7  CL 105 109  CO2 25 24  GLUCOSE 113* 104*  BUN 30* 22  CREATININE 1.41* 0.79  CALCIUM 9.2 8.8*  AST 23  --   ALT 37  --   ALKPHOS 127*  --   BILITOT 0.6  --    ------------------------------------------------------------------------------------------------------------------  Cardiac Enzymes Recent Labs  Lab 11/01/18 1310  TROPONINI 0.04*   ------------------------------------------------------------------------------------------------------------------  RADIOLOGY:  No results found.   ASSESSMENT AND PLAN:   85 year old female with past medical history of dementia, history of previous CVA, osteoarthritis, hypertension who presented to the hospital due to worsening altered mental status.  1.  Altered mental status- due to advanced dementia and due to pt. Getting benzo's.   - Patient apparently has had episodes of decreased responsiveness which has been intermittent in nature.  There was some concern for possible underlying seizures but patient has had no tonic-clonic seizure type activity. -CT head, MRI of the brain shows no acute  pathology bilateral and old strokes.  Seen by neurology and had EEG which was (-) for seizures.   Discontinue Ativan, will place on some low-dose Haldol as needed for agitation.  Continue to follow mental status.  2.  History of  previous CVA-continue aspirin, Plavix, atorvastatin.  3.  Dementia-continue Namenda. -Continue Risperdal.  - avoid benzo's and use Haldol PRN for agitation/sundowning.  4.  Hypothyroidism-continue Synthroid.  5.  Essential hypertension-continue clonidine, lisinopril, Avapro.   - BP improved/stable.   6. Depression - cont. Zoloft.    Pt. Is from [facility] assisted living and given her profound encephalopathy/altered mental status she is not stable to be discharged there presently.  Family is not in agreement to discharge to a skilled nursing facility, hope the mental status improves over the next 24 hours so she can go back to [facility].  All the records are reviewed and case discussed with Care Management/Social Worker. Management plans discussed with the patient, family and they are in agreement.  CODE STATUS: DNR  DVT Prophylaxis: Lovenox  TOTAL TIME TAKING CARE OF THIS PATIENT: 30 minutes.   POSSIBLE D/C in 1-2, DEPENDING ON CLINICAL CONDITION and progress. Marland Kitchen   Henreitta Leber M.D on 11/04/2018 at 2:31 PM  Between 7am to 6pm - Pager - [phone]  After 6pm go to [email or web address] - [facility]  Office  [phone]  CC: Primary care physician; [facility]

## 2018-11-05 MED ORDER — LORAZEPAM 0.5 MG PO TABS: 0.2500 mg | ORAL_TABLET | Freq: Two times a day (BID) | ORAL | 0 refills | Status: DC | PRN

## 2018-11-05 NOTE — Progress Notes (Signed)
Per CSW candace Louretta Shorten, patient will not be returning to [facility], she will be discharging to [name] Discharge summary faxed to referral. Flo Shanks RN, BSN, [facility] and [facility] [place], hospital Liaison [phone]

## 2018-11-05 NOTE — Discharge Summary (Signed)
[place] at [place] NAME: Cathy Johnson    MR#:  [id number]  DATE OF BIRTH:  07/04/1928  DATE OF ADMISSION:  11/01/2018 ADMITTING PHYSICIAN: Dustin Flock, MD  DATE OF DISCHARGE: 11/05/2018  PRIMARY CARE PHYSICIAN: [facility]    ADMISSION DIAGNOSIS:  Altered mental status, unspecified altered mental status type [R41.82]  DISCHARGE DIAGNOSIS:  Active Problems:   Acute encephalopathy   SECONDARY DIAGNOSIS:   Past Medical History:  Diagnosis Date  . Arthritis   . Dementia ([place])   . Hypertension   . Osteoarthritis of right knee 05/02/2014  . PFO (patent foramen ovale)    PFO by bubble study 02/05/11 T[facility]  . Stroke [facility])     HOSPITAL COURSE:   85 year old female with past medical history of dementia, history of previous CVA, osteoarthritis, hypertension who presented to the hospital due to worsening altered mental status.  1.  Altered mental status- due to advanced dementia and due to pt. Getting benzo's.   - Patient presented to the hospital due to episodes of decreased responsiveness which had been intermittent in nature.  There was some concern for possible underlying seizures but patient had no tonic-clonic seizure type activity. - Pt. Underwent CT head, MRI of the brain shows no acute pathology bilateral and old strokes.  Seen by neurology and had EEG which was (-) for seizures.   Patient's Ativan dose was tapered and her mental status when she is off the Ativan has improved.  She will still be discharged on Ativan but at a lower dose and to be given as needed if she were to get significant anxiety.  The family is in agreement with this plan.  2.  History of previous CVA- she will continue aspirin, Plavix, atorvastatin.  3.  Dementia- she will continue Namenda. - she will Continue Risperdal.  - cont. Low dose Benzo's PRN for anxiety/Agitations.    4.  Hypothyroidism- she will continue Synthroid.  5.   Essential hypertension- she had some labile blood pressures but they have improved. -She will continue her clonidine, lisinopril, Micardis.   6. Depression - she will cont. Zoloft.  Mental status has improved and patient was at memory care at an assisted living but they are unable to take care of her and now she is being discharged to a skilled nursing facility for further care.  Patient's family is in agreement with this plan.  Palliative Care to follow patient at the facility given her advanced Dementia.   DISCHARGE CONDITIONS:   Stable.   CONSULTS OBTAINED:  Treatment Team:  Lang Snow, NP  DRUG ALLERGIES:   Allergies  Allergen Reactions  . Boniva [Ibandronic Acid] Other (See Comments)    "Allergic," per MAR  . Crestor [Rosuvastatin Calcium] Other (See Comments)    "Allergic," per MAR  . Exelon [Rivastigmine Tartrate] Other (See Comments)    "Allergic," per MAR  . Fosamax [Alendronate Sodium] Other (See Comments)    "Allergic," per MAR  . Galantamine Other (See Comments)    "Allergic," per MAR  . Lipitor [Atorvastatin] Other (See Comments)    "Allergic," per MAR  . Morphine And Related Other (See Comments)    "Allergic," per MAR  . Other Other (See Comments)    "Sedatives" and "Multiple, unknown B/P meds" = "Allergic," per MAR  . Vagifem [Estradiol] Other (See Comments)    "Allergic," per [facility]    DISCHARGE MEDICATIONS:   Allergies as of 11/05/2018  Reactions   Boniva [ibandronic Acid] Other (See Comments)   "Allergic," per [facility]   Crestor [rosuvastatin Calcium] Other (See Comments)   "Allergic," per MAR   Exelon [rivastigmine Tartrate] Other (See Comments)   "Allergic," per MAR   Fosamax [alendronate Sodium] Other (See Comments)   "Allergic," per [facility]   Galantamine Other (See Comments)   "Allergic," per [facility]   Lipitor [atorvastatin] Other (See Comments)   "Allergic," per MAR   Morphine And Related Other (See Comments)   "Allergic," per [facility]    Other Other (See Comments)   "Sedatives" and "Multiple, unknown B/P meds" = "Allergic," per MAR   Vagifem [estradiol] Other (See Comments)   "Allergic," per [facility]      Medication List    TAKE these medications   acetaminophen 500 MG tablet Commonly known as:  TYLENOL Take 1,000 mg by mouth 2 (two) times daily.   aspirin 81 MG chewable tablet Chew 81 mg by mouth daily.   atorvastatin 20 MG tablet Commonly known as:  LIPITOR Take 20 mg by mouth at bedtime.   CALCI-CHEW 1250 (500 Ca) MG chewable tablet Generic drug:  calcium carbonate Chew 1 tablet by mouth 2 (two) times daily.   cloNIDine 0.3 MG tablet Commonly known as:  CATAPRES Take 0.3 mg by mouth 2 (two) times daily.   clopidogrel 75 MG tablet Commonly known as:  PLAVIX Take 1 tablet (75 mg total) by mouth daily.   diclofenac sodium 1 % Gel Commonly known as:  VOLTAREN Apply 4 g topically See admin instructions. Apply 4 grams to both knees two times a day and an additional 4 grams to both knees two times a day as needed for arthritis pain   ferrous sulfate 325 (65 FE) MG tablet Take 325 mg by mouth daily with breakfast.   guaifenesin 100 MG/5ML syrup Commonly known as:  ROBITUSSIN Take 200 mg by mouth every 6 (six) hours as needed (for 3 days, for coughing and notify MD if coughs persists for more than 3 days).   levothyroxine 112 MCG tablet Commonly known as:  SYNTHROID, LEVOTHROID Take 112 mcg by mouth daily before breakfast.   lisinopril 40 MG tablet Commonly known as:  PRINIVIL,ZESTRIL Take 40 mg by mouth daily.   loperamide 2 MG tablet Commonly known as:  IMODIUM A-D Take 2 mg by mouth See admin instructions. Take 2 mg by mouth after each loose stool as needed/max humber of doses, up to 3/12 hours and notify MD if no relief after 24 hrs   LORazepam 0.5 MG tablet Commonly known as:  ATIVAN Take 0.5 tablets (0.25 mg total) by mouth 2 (two) times daily as needed for anxiety. What changed:    medication  strength  how much to take  when to take this  reasons to take this   memantine 10 MG tablet Commonly known as:  NAMENDA Take 10 mg by mouth 2 (two) times daily.   polyethylene glycol packet Commonly known as:  MIRALAX / GLYCOLAX Take 17 g by mouth daily.   risperiDONE 0.5 MG tablet Commonly known as:  RISPERDAL Take 0.5 mg by mouth 2 (two) times daily.   sertraline 50 MG tablet Commonly known as:  ZOLOFT Take 75 mg by mouth daily.   telmisartan 80 MG tablet Commonly known as:  MICARDIS Take 80 mg by mouth daily.   vitamin B-12 500 MCG tablet Commonly known as:  CYANOCOBALAMIN Take 500 mcg by mouth daily.   Vitamin D3 50 MCG (2000 UT)  Tabs Take 2,000 Units by mouth daily.         DISCHARGE INSTRUCTIONS:   DIET:  Cardiac diet  DISCHARGE CONDITION:  Stable  ACTIVITY:  Activity as tolerated  OXYGEN:  Home Oxygen: No.   Oxygen Delivery: room air  DISCHARGE LOCATION:  nursing home   If you experience worsening of your admission symptoms, develop shortness of breath, life threatening emergency, suicidal or homicidal thoughts you must seek medical attention immediately by calling 911 or calling your MD immediately  if symptoms less severe.  You Must read complete instructions/literature along with all the possible adverse reactions/side effects for all the Medicines you take and that have been prescribed to you. Take any new Medicines after you have completely understood and accpet all the possible adverse reactions/side effects.   Please note  You were cared for by a hospitalist during your hospital stay. If you have any questions about your discharge medications or the care you received while you were in the hospital after you are discharged, you can call the unit and asked to speak with the hospitalist on call if the hospitalist that took care of you is not available. Once you are discharged, your primary care physician will handle any further medical  issues. Please note that NO REFILLS for any discharge medications will be authorized once you are discharged, as it is imperative that you return to your primary care physician (or establish a relationship with a primary care physician if you do not have one) for your aftercare needs so that they can reassess your need for medications and monitor your lab values.     Today   Mental Status waxes and wanes and she was more awake this a.m. Will discharge to SNF today and family is in agreement.   VITAL SIGNS:  Blood pressure (!) 174/69, pulse 60, temperature 98 F (36.7 C), temperature source Oral, resp. rate 16, height 5\' 4"  (1.626 m), weight 72 kg, SpO2 96 %.  I/O:    Intake/Output Summary (Last 24 hours) at 11/05/2018 1317 Last data filed at 11/05/2018 0516 Gross per 24 hour  Intake -  Output 1250 ml  Net -1250 ml    PHYSICAL EXAMINATION:   GENERAL:  85 y.o.-year-old patient lying in bed awake and follows some simple commands.  EYES: Pupils equal, round, reactive to light. No scleral icterus. Extraocular muscles intact.  HEENT: Head atraumatic, normocephalic. Oropharynx and nasopharynx clear.  NECK:  Supple, no jugular venous distention. No thyroid enlargement, no tenderness.  LUNGS: Poor Resp. effort, no wheezing, rales, rhonchi. No use of accessory muscles of respiration.  CARDIOVASCULAR: S1, S2 normal. No murmurs, rubs, or gallops.  ABDOMEN: Soft, nontender, nondistended. Bowel sounds present. No organomegaly or mass.  EXTREMITIES: No cyanosis, clubbing or edema b/l.    NEUROLOGIC: Cranial nerves II through XII are intact. No focal Motor or sensory deficits b/l.  Globally weak.  PSYCHIATRIC: The patient is alert and oriented x 2.  SKIN: No obvious rash, lesion, or ulcer.   DATA REVIEW:   CBC Recent Labs  Lab 11/02/18 0517  WBC 8.4  HGB 12.0  HCT 37.8  PLT 230    Chemistries  Recent Labs  Lab 11/01/18 1310 11/02/18 0517  NA 138 140  K 4.3 3.7  CL 105 109  CO2  25 24  GLUCOSE 113* 104*  BUN 30* 22  CREATININE 1.41* 0.79  CALCIUM 9.2 8.8*  AST 23  --   ALT 37  --   ALKPHOS  127*  --   BILITOT 0.6  --     Cardiac Enzymes Recent Labs  Lab 11/01/18 1310  TROPONINI 0.04*    Microbiology Results  Results for orders placed or performed during the hospital encounter of 11/01/18  MRSA PCR Screening     Status: None   Collection Time: 11/01/18 11:28 PM  Result Value Ref Range Status   MRSA by PCR NEGATIVE NEGATIVE Final    Comment:        The GeneXpert MRSA Assay (FDA approved for NASAL specimens only), is one component of a comprehensive MRSA colonization surveillance program. It is not intended to diagnose MRSA infection nor to guide or monitor treatment for MRSA infections. Performed at [facility], [address], [place], [place] [address]     RADIOLOGY:  No results found.    Management plans discussed with the patient, family and they are in agreement.  CODE STATUS:     Code Status Orders  (From admission, onward)         Start     Ordered   11/01/18 2313  Do not attempt resuscitation (DNR)  Continuous    Question Answer Comment  In the event of cardiac or respiratory ARREST Do not call a "code blue"   In the event of cardiac or respiratory ARREST Do not perform Intubation, CPR, defibrillation or ACLS   In the event of cardiac or respiratory ARREST Use medication by any route, position, wound care, and other measures to relive pain and suffering. May use oxygen, suction and manual treatment of airway obstruction as needed for comfort.      11/01/18 2313        TOTAL TIME TAKING CARE OF THIS PATIENT: 40 minutes.    Henreitta Leber M.D on 11/05/2018 at 1:17 PM  Between 7am to 6pm - Pager - [phone]  After 6pm go to [email or web address] - [facility]  Office  [phone]  CC: Primary care physician; [facility]

## 2018-11-05 NOTE — Clinical Social Work Note (Signed)
Patient is medically ready for discharge today. CSW notified patient's guardian, Veria Stradley at bedside of discharge today to [facility]. Guardian is in agreement. CSW notified Olivia Mackie at [facility] of discharge today. Patient will be transported by EMS. RN to call report and call for EMS.   [place], [place]

## 2018-11-05 NOTE — Care Management Important Message (Signed)
Important Message  Patient Details  Name: Cathy Johnson MRN: [id number] Date of Birth: [birth year]   Medicare Important Message Given:  Yes    Juliann Pulse A Kecia Swoboda 11/05/2018, 11:33 AM

## 2018-11-10 ENCOUNTER — Non-Acute Institutional Stay: Payer: Medicare Other | Admitting: *Deleted

## 2018-11-10 VITALS — BP 144/94 | HR 68 | Temp 97.4°F | Resp 18 | Ht 64.0 in | Wt 158.4 lb

## 2018-11-10 DIAGNOSIS — Z515 Encounter for palliative care: Secondary | ICD-10-CM

## 2018-11-12 NOTE — Progress Notes (Signed)
COMMUNITY PALLIATIVE CARE RN NOTE  PATIENT NAME: Cathy Johnson DOB: Apr 13, 1928 MRN: [id number]  PRIMARY CARE PROVIDER: [facility]  RESPONSIBLE PARTY: Jimmey Ralph (son) Acct ID - Guarantor Home Phone Work Phone Relationship Acct Type  [id number] - [name]  Self P/F     [place], [place], [place] [address]    PLAN OF CARE and INTERVENTION:  1. ADVANCE CARE PLANNING/GOALS OF CARE: Son's goal is to find a SNF facility for patient once Rehab is complete. She now requires a higher level of care than her previous AL can provide 2. PATIENT/CAREGIVER EDUCATION: Explained Palliative Services 3. DISEASE STATUS: Met with patient, son Cathy Johnson and caregiver in patient's room at the facility. She was recently admitted to SNF for Rehab s/p hospitalization at [facility] from 11/01/18 to 11/05/18 for altered mental status. Patient is sitting up in her wheelchair awake and alert. Her speech is garbled, but she does make eye contact. No physical indicators of pain noted. She does experience some anxiety at times and has PRN Ativan available. She is dependent for dressing, toileting and hygiene. She is able to feed herself independently. Her intake is usually 100% of 3 meals/day. She has intermittent bladder incontinence and wears Depends. Hired caregiver has patient on a toileting schedule every 2 hours while she is present. Caregiver's scheduled days with patient varies, but is usually from 9a-2p. She has been having issues with elevated BP. Reviewed medication list with son. Patient is taking Clonidine, Micardis and Lisinopril to help regulate BPs. Son was told that patient now requires a higher level of care so will not be able to return to the [facility] facility she was in. Son to speak with facility Coordinator to discuss patient plans. Will continue to monitor.   HISTORY OF PRESENT ILLNESS:  This is a 85 yo female who currently resides at [facility] for Rehab. Palliative  Care referral made at discharge from hospital to review goals of care and assist with symptom management needs. She has been followed by Palliative Care for over a year now so son is very familiar. Will visit monthly and PRN.   CODE STATUS: DNR  ADVANCED DIRECTIVES: Y MOST FORM: no PPS: 30%   PHYSICAL EXAM:   VITALS: Today's Vitals   11/10/18 1232  BP: (!) 144/94  Pulse: 68  Resp: 18  Temp: (!) 97.4 F (36.3 C)  TempSrc: Temporal  SpO2: 93%  Weight: 158 lb 6.4 oz (71.8 kg)  Height: _0  (1.626 m)  PainSc: 0-No pain    LUNGS: clear to auscultation  CARDIAC: Cor RRR EXTREMITIES: No edema SKIN: Exposed skin is dry and intact  NEURO: Alert and oriented to self, intermittent confusion, garbled speech, wheelchair bound   (Duration of visit and documentation 90 minutes)    Daryl Eastern, RN, BSN

## 2018-11-16 ENCOUNTER — Other Ambulatory Visit: Payer: Self-pay | Admitting: *Deleted

## 2018-11-19 NOTE — Patient Outreach (Signed)
[facility]) Care Management  11/19/2018  IMAJEAN MCDERMID May 17, 1928 [id number]   Attended interdisciplinary team meeting at [facility] Rehab with discharge [place] to discuss patient's progress and plan for transitioning to home. Patient was previously living at an ALF. During discussion of this patient a change in PCP was discovered.  Patient is now being seen by [facility] who is not part of the [place] Management services.   [facility] CM will not continue to follow this patient.   Rutherford Limerick RN, BSN [place] Acute Care Coordinator [phone]) Business Mobile [phone]) Toll free office

## 2018-12-03 ENCOUNTER — Non-Acute Institutional Stay: Payer: Medicare Other | Admitting: Licensed Clinical Social Worker

## 2018-12-03 DIAGNOSIS — Z515 Encounter for palliative care: Secondary | ICD-10-CM

## 2018-12-06 NOTE — Progress Notes (Signed)
COMMUNITY PALLIATIVE CARE SW NOTE  PATIENT NAME: ARAH ARO DOB: 12/25/1927 MRN: [id number]  PRIMARY CARE PROVIDER: [facility]  RESPONSIBLE PARTY:  Acct ID - Guarantor Home Phone Work Phone Relationship Acct Type  [id number] - [name]  Self P/F     [place], [place], [place] [address]     PLAN OF CARE and INTERVENTIONS:             1. GOALS OF CARE/ ADVANCE CARE PLANNING:  Goal is for son to locate another SNF once her rehab is complete. 2. SOCIAL/EMOTIONAL/SPIRITUAL ASSESSMENT/ INTERVENTIONS:  SW met with patient in her room at [facility].  She was in her w/c and OT was helping her with lunch.  She could hold her utensils, but was having difficulty getting it to her mouth.  Patient was focused on her food and did not give SW eye contact.  She did not display any nonverbal indicators of food.  The facility nurse was unavailable during visit. 3. PATIENT/CAREGIVER EDUCATION/ COPING:  Educated OT, Loa Socks, regarding Palliative Care. 4. PERSONAL EMERGENCY PLAN:  Per facility protocol. 5. COMMUNITY RESOURCES COORDINATION/ HEALTH CARE NAVIGATION:  None. 6. FINANCIAL/LEGAL CONCERNS/INTERVENTIONS:  None identified.     SOCIAL HX:  Social History   Tobacco Use  . Smoking status: Never Smoker  . Smokeless tobacco: Never Used  Substance Use Topics  . Alcohol use: No    CODE STATUS:  DNR  ADVANCED DIRECTIVES:  Y MOST FORM COMPLETE:  N HOSPICE EDUCATION PROVIDED: N PPS: Staff reported patient's appetite is normal.  She was in her w/c during visit. Duration of visit and documentation:  40 minutes.      Creola Corn [place], LCSW

## 2018-12-30 ENCOUNTER — Other Ambulatory Visit: Payer: Self-pay

## 2018-12-30 ENCOUNTER — Other Ambulatory Visit: Payer: Medicare Other

## 2018-12-30 DIAGNOSIS — Z515 Encounter for palliative care: Secondary | ICD-10-CM

## 2018-12-30 NOTE — Progress Notes (Signed)
PATIENT NAME: Cathy Johnson DOB: 1928-02-24 MRN: [id number]  PRIMARY CARE PROVIDER: [facility]  RESPONSIBLE PARTY:  Acct ID - Guarantor Home Phone Work Phone Relationship Acct Type  [id number] - [name]  Self P/F     [place], [place], [place] [address]    PLAN OF CARE and INTERVENTIONS:               1.  GOALS OF CARE/ ADVANCE CARE PLANNING: Comfort with symptoms managed.               2.  PATIENT/CAREGIVER EDUCATION:  Education to facility staff nurse Ola on fall precautions and to administer meds as directed to control anxiety.  Good hand washing to prevent infection.                 3.  DISEASE STATUS: Patient is a 85 year old female who resides at [name]  Patient sitting up in recliner in her room.  Staff nurse Ola reports patient is unable to ambulate and patient is a fall risk as patient attempts to stand.  Patient remains confused and speech remains garbled.  Patient makes eye contact and attempts to talk to RN.  Patient allows nurse to complete assessment.    Patient is able to feed self and eats well per facility staff.  Patient wears pullups for incontinence.  Patient requires assist with bathing and dressing.  Patient appears comfortable.  Staff nurse Ola reports patient does get agitated and has Ativan for use to control anxiety.  Ola denies patient having any need at the present time.  Ola informed to call with questions or concerns.             HISTORY OF PRESENT ILLNESS:    CODE STATUS: DNR (per MOST form)  ADVANCED DIRECTIVES:Y MOST FORM: Yes PPS: 40%   PHYSICAL EXAM:   VITALS: Today's Vitals   12/30/18 1010  BP: 128/76  Pulse: 100  Resp: 18  Temp: 98 F (36.7 C)  TempSrc: Temporal  SpO2: 93%  PainSc: 0-No pain    LUNGS: negative findings: normal respiratory rate and rhythm, lungs clear to auscultation CARDIAC: Cor Tachy  EXTREMITIES: Trace edema SKIN: Skin color, texture, turgor normal. No rashes or lesions   NEURO: positive for gait problems, memory problems and speech problems       [name], RN

## 2019-01-25 ENCOUNTER — Telehealth: Payer: Self-pay

## 2019-01-25 ENCOUNTER — Other Ambulatory Visit: Payer: Medicare Other

## 2019-01-25 ENCOUNTER — Other Ambulatory Visit: Payer: Self-pay

## 2019-01-25 DIAGNOSIS — Z515 Encounter for palliative care: Secondary | ICD-10-CM

## 2019-01-25 NOTE — Telephone Encounter (Signed)
SW received call back from patient's son, [name]. SW provided update from TELEHEALTH visit with Palliative Care team today. [name] provided medical history on patient. [name] reports that his main focus is on patient's blood pressure and prevention of falls. SW to update Therapist, sports. [name] said that he would like to move patient closer, as he is moving to [place], but he will reassess after COVID-19 crisis ends. SW provided education on Palliative Care team role and provided contact information should any questions or concerns arise. [name] was appreciative of SW call.

## 2019-01-25 NOTE — Progress Notes (Signed)
PATIENT NAME: NEZIAH VOGELGESANG DOB: 04-Jan-1928 MRN: [id number]  PRIMARY CARE PROVIDER: [facility]  RESPONSIBLE PARTY:  Acct ID - Guarantor Home Phone Work Phone Relationship Acct Type  [id number] - [name]  Self P/F     [place], [place], [place] [address]    PLAN OF CARE and INTERVENTIONS:               1.  GOALS OF CARE/ ADVANCE CARE PLANNING:  Remain at facility and be comfortable.               2.  PATIENT/CAREGIVER EDUCATION: Education to staff coordinator LeaAnn on signs and symptoms of infection, good handwashing, administer meds as directed to control agitation.                 3.  DISEASE STATUS:Patient is a 85 year old who resides at [facility] with Dementia diagnosis.  RN completed TELEMED visit via [facility].  Visit was scheduled with hospice and other Palliative Care team members Cooper Render NP, Carla RN, Eddie Mable Fill and [place] SW.) DON Con Memos and [facility] director Tawny Hopping in agreement with [place] visit.  [place] to call patient's son [name] to see if he has questions or concerns.  LeaAnn reports patient "is doing about the same." Patient has not suffered any weight loss.  Patient continues to go to the dining room to eat her meals.  Patient is wheelchair bound.  Patient continues to have periods of agitation and has Lorazepam to take 0.5 mg bid prn.  Patient in bed sleeping when TELEMED visit was made.  LeaAnn did not wake patient for visit.  RN was able to see patient restin gin bed.  Patient appeared comfortable.  Patient has not had any recent hospitalizations.  Facility staff denies having any concerns regarding patient at the present time.  Facility staff RCC encouraged to contact Palliative team with questions or concerns.        Due to the COVID-19 crisis, this visit was done via telemedicine from my office and it was initiated and consent by this patient and or family.  HISTORY OF PRESENT ILLNESS:    CODE STATUS: DNR   ADVANCED DIRECTIVES: Y MOST FORM: No PPS: 30%   PHYSICAL EXAM:   VITALS: See Vital signs.  Vital signs taken via facility nurse at facility.  LUNGS: No cough or shortness of breath observed in patient.   CARDIAC: Regular rate and rhythm per facility staff EXTREMITIES: No edema  SKIN: exposed skin isdry and intact without bruising or lesions.  NEURO: Alert to self, garbled speech, wheelchair bound per facility staff.        [name], RN

## 2019-02-22 ENCOUNTER — Other Ambulatory Visit: Payer: Medicare Other

## 2019-02-22 ENCOUNTER — Other Ambulatory Visit: Payer: Self-pay

## 2019-02-22 DIAGNOSIS — Z515 Encounter for palliative care: Secondary | ICD-10-CM

## 2019-02-22 NOTE — Progress Notes (Signed)
PATIENT NAME: Cathy Johnson DOB: 01-20-1928 MRN: [id number]  PRIMARY CARE PROVIDER: [facility]  RESPONSIBLE PARTY:  Acct ID - Guarantor Home Phone Work Phone Relationship Acct Type  [id number] - [name]  Self P/F     [place], [place],  [address]    PLAN OF CARE and INTERVENTIONS:               1.  GOALS OF CARE/ ADVANCE CARE PLANNING:  Remain at facility and be comfortable.               2.  PATIENT/CAREGIVER EDUCATION:  Education on s/s of infection, cough fever, chills, fatigue, education on fall precautions.               3.  DISEASE STATUS: Patient is a 85 year old patient with Dementia.  Patient resides at [facility].  Ardath Sax at facility in agreement with TELEHEALTH visit today and visit completed with SW, NP [facility] and hospice team members.  Due to the COVID-19 crisis, this visit was done via telemedicine from my office and it was initiated and consent by this patient and or family. Patient lying in bed sleeping soundly, no s/s of pain or discomfort observed in patient.  Secundino Ginger reports patient's son made trip to facility over the weekend and saw patient through the window.  Patient's appetite is fair and patient has been eating 50% of her meals.  Patient was running a low grade temperature over the weekend but has been afebrile since Sunday.  Patient has not suffered any falls.  Patient is non ambulatory.  Patient has increased agitation in the afternoons and nurse keeps patient close to [facility] so she can be monitored per Secundino Ginger.  Patient has not has any medication changes.  LeeAnn encouraged to call with questions or concerns.              HISTORY OF PRESENT ILLNESS:    CODE STATUS: DNR  ADVANCED DIRECTIVES: Y MOST FORM: No PPS: 30%   PHYSICAL EXAM:   VITALS:TELEHEALTH visit VS normal per ADON LeeAnn   LUNGS: dry non productive cough CARDIAC:  EXTREMITIES: trace edema to LE's SKIN: Skin color, texture, turgor normal.  No rashes or lesions  NEURO: positive for gait problems, memory problems and speech problems       [name], RN

## 2019-02-22 NOTE — Progress Notes (Signed)
COMMUNITY PALLIATIVE CARE SW NOTE  PATIENT NAME: Cathy Johnson DOB: 09/06/1928 MRN: [id number]  PRIMARY CARE PROVIDER: [facility]  RESPONSIBLE PARTY:  Acct ID - Guarantor Home Phone Work Phone Relationship Acct Type  [id number] [name]  Self P/F     [place], [place], [place] [address]     PLAN OF CARE and INTERVENTIONS:             1. GOALS OF CARE/ ADVANCE CARE PLANNING:  Goal is for son to move patient to closer facility in [place]. Patient is DNR. 2. SOCIAL/EMOTIONAL/SPIRITUAL ASSESSMENT/ INTERVENTIONS:  SW and RN met with patient and Secundino Ginger, facility staff member via TELEHEALTH. Secundino Ginger said patient continues to have agitation, but seems to be more in the afternoon. Patient has a dry cough, a low grade fever. Facility to complete an x-ray with no concerns. Secundino Ginger reports that patient has had no falls. Patient was resting during visit, appeared comfortable. 3. PATIENT/CAREGIVER EDUCATION/ COPING:  Patient doing well overall. Patient's son calls frequently to check on patient and was able to come and do "window visit" last week and patient enjoyed that. 4. PERSONAL EMERGENCY PLAN:  Facility to call 9-1-1 for emergencies. Due to COVID-19, visitors are limited at facility. 5. COMMUNITY RESOURCES COORDINATION/ HEALTH CARE NAVIGATION:  None. 6. FINANCIAL/LEGAL CONCERNS/INTERVENTIONS:  None.     SOCIAL HX:  Social History   Tobacco Use  . Smoking status: Never Smoker  . Smokeless tobacco: Never Used  Substance Use Topics  . Alcohol use: No    CODE STATUS:   Code Status: Prior  ADVANCED DIRECTIVES: N MOST FORM COMPLETE:  No. HOSPICE EDUCATION PROVIDED: None.  Due to the COVID-19 crisis, this visit was done via Zoom from my office and it was initiated and consent by this patient and/or facility. This was a scheduled visit.   I spent 30 minutes with patient/family, from 3:00-3:30p providing education, support and consultation   Margaretmary Lombard, LCSW

## 2019-03-21 ENCOUNTER — Telehealth: Payer: Self-pay

## 2019-03-21 NOTE — Telephone Encounter (Signed)
Telehealth visit scheduled for patient with RCC at facility.  RN and SW to meet with RCC for telehealth visit at 2:00 PM.

## 2019-03-22 ENCOUNTER — Other Ambulatory Visit: Payer: Self-pay

## 2019-03-22 ENCOUNTER — Non-Acute Institutional Stay: Payer: Medicare Other

## 2019-03-22 ENCOUNTER — Other Ambulatory Visit: Payer: Medicare Other

## 2019-03-22 DIAGNOSIS — Z515 Encounter for palliative care: Secondary | ICD-10-CM

## 2019-03-22 NOTE — Progress Notes (Signed)
COMMUNITY PALLIATIVE CARE SW NOTE  PATIENT NAME: Cathy Johnson DOB: 1928/02/28 MRN: [id number]  PRIMARY CARE PROVIDER: [facility]  RESPONSIBLE PARTY:  Acct ID - Guarantor Home Phone Work Phone Relationship Acct Type  [id number] - [name]  Self P/F     [place], [place], [place] [address]     PLAN OF CARE and INTERVENTIONS:             1. GOALS OF CARE/ ADVANCE CARE PLANNING: Patient is DNR. 2. SOCIAL/EMOTIONAL/SPIRITUAL ASSESSMENT/ INTERVENTIONS:  SW and RN met with patient and Secundino Ginger, facility staff member via TELEHEALTH. Patient is doing "better" overall. MD has stopped patient's Ativan, except for PRN in the evenings with agitation. Patient is now more alert and responsive during the day. Patient is combative, at times and has a skin tear as a result from an incident. Marylin Crosby said staff continues to be educated on how to safely care for patient's during combative episodes. LeeAnn notes no falls. Patient is eating half of her meals. Secundino Ginger said patient now has a baby doll and enjoys interacting with the doll.  3. PATIENT/CAREGIVER EDUCATION/ COPING:  Patient doing much better. Patient's son is coming by for window visits and calls frequently.  4. PERSONAL EMERGENCY PLAN:  Facility to call 9-1-1 for emergencies. Due to COVID-19, visitors are limited at facility. 5. COMMUNITY RESOURCES COORDINATION/ HEALTH CARE NAVIGATION:  None. 6. FINANCIAL/LEGAL CONCERNS/INTERVENTIONS:  None.     SOCIAL HX:  Social History   Tobacco Use  . Smoking status: Never Smoker  . Smokeless tobacco: Never Used  Substance Use Topics  . Alcohol use: No    CODE STATUS:   Code Status: Prior  ADVANCED DIRECTIVES: N MOST FORM COMPLETE:  No. HOSPICE EDUCATION PROVIDED: None.  Due to the COVID-19 crisis, this visit was done viaZoomfrom my office and it was initiated and consent by this patient and/or facility.This was a scheduled visit.  I spent8mnutes with  patient/family, from 2:00-2:30pproviding education, support and consultation  WMargaretmary Lombard LCSW

## 2019-03-22 NOTE — Progress Notes (Signed)
PATIENT NAME: Cathy Johnson DOB: 10-03-1928 MRN: [id number]  PRIMARY CARE PROVIDER: [facility]  RESPONSIBLE PARTY:  Acct ID - Guarantor Home Phone Work Phone Relationship Acct Type  [id number] - [name]  Self P/F     [name], [name], [name] [address]    PLAN OF CARE and INTERVENTIONS:               1.  GOALS OF CARE/ ADVANCE CARE PLANNING:  Remain at facility, with decreased agitation.                 2.  PATIENT/CAREGIVER EDUCATION:  Education on fall precautions, education on s/s of infection, cough, fever, shortness of breath, cough increased fatigue                3.  DISEASE STATUS: Patient is a 85 year old patient who resides at [name]  SW and RN met with RN Cathy Johnson at facility for visit. Due to the COVID-19 crisis, this visit was done via telemedicine from my office and it was initiated and consent by this patient and or family. RN Cathy Johnson reports patient's Lorazepam has been decreased to daily as needed in the evenings.  Patient had been sleeping most of the time.  Cathy Johnson reports since med change patient has been more alert.  Patient smiling, no s/s of pain or discomfort observed in patient.  Patient smiling and is confused.  Patient suffered a skin tear last PM after being combative with facility staff.  Cathy Johnson reports education was provided to facility staff reapproach patient after a few minutes to give patient time to calm down before attempting to provide care to patient.  Patient's B/P being checked bid and when Clonidine is administered to patient. Patient has been eating 50 -76% of meals.  Patient unable to ambulate but is got up to chair daily.  Staff gave patient a doll and patient does not try to get out of bed/chair as much since doll was given to patient.  Patient's son Cathy Johnson continues to visit patient from outside window per Cathy Johnson.  Patient's vital signs have been stable.  Patient has not suffered any falls.  Cathy Johnson  denies having any concerns regarding patient at the present time.  Cathy Johnson informed to call with questions or concerns.            HISTORY OF PRESENT ILLNESS:    CODE STATUS: DNR  ADVANCED DIRECTIVES: Y MOST FORM: No PPS: 30%   PHYSICAL EXAM:   VITALS:Telemed visit VS normal per ADON Cathy Johnson  LUNGS: No cough or shortness of breath CARDIAC:  EXTREMITIES: Trace edema SKIN: skin tear to right elbow  NEURO: positive for memory problems       Cathy Simmer, RN

## 2019-06-28 ENCOUNTER — Non-Acute Institutional Stay: Payer: Medicare Other | Admitting: Adult Health Nurse Practitioner

## 2019-06-28 DIAGNOSIS — Z515 Encounter for palliative care: Secondary | ICD-10-CM

## 2019-06-28 NOTE — Progress Notes (Signed)
[facility] Consult Note Telephone: [phone]  Fax: [phone]  PATIENT NAME: Cathy Johnson DOB: [birth year] MRN: [id number]  PRIMARY CARE PROVIDER:   [facility]  REFERRING PROVIDER:  [facility] [place],  [place] [address]  RESPONSIBLE PARTY:   Jaydah Cutler, son  H: [phone]  C: [phone]  Due to the COVID-19 crisis, this visit was done via telemedicine and it was initiated and consent by this patient and or family.  This meeting was done via [place] facilitated by facility ADON, [place] and PLAN:  1.  Advanced care planning.  Patient is a DNR  2.  Dementia.  FAST 7b.  Patient is unable to ambulate.  Requires lift for transfers.  Requires extensive assistance with ADLs.  Is a feeder.  Currently weighs 142.8 which is up over a pound from last visit.  Eats 50-75% of meals.  Can have some agitation and is being seen by psych services. No reported falls or hospital visits since last visit.   Continue psych recommendations and supportive care.    I spent 30 minutes providing this consultation,  from 3:30 to 4:00. More than 50% of the time in this consultation was spent coordinating communication.   HISTORY OF PRESENT ILLNESS:  Cathy Johnson is a 85 y.o. year old female with multiple medical problems including dementia, OA, HTN, h/o CVA. Palliative Care was asked to help address goals of care.   CODE STATUS: DNR  PPS: 30% HOSPICE ELIGIBILITY/DIAGNOSIS: TBD  PAST MEDICAL HISTORY:  Past Medical History:  Diagnosis Date  . Arthritis   . Dementia ([place])   . Hypertension   . Osteoarthritis of right knee 05/02/2014  . PFO (patent foramen ovale)    PFO by bubble study 02/05/11 TEE  . Stroke [facility])     SOCIAL HX:  Social History   Tobacco Use  . Smoking status: Never Smoker  . Smokeless tobacco: Never Used  Substance Use Topics  . Alcohol  use: No    ALLERGIES:  Allergies  Allergen Reactions  . Boniva [Ibandronic Acid] Other (See Comments)    "Allergic," per MAR  . Crestor [Rosuvastatin Calcium] Other (See Comments)    "Allergic," per MAR  . Exelon [Rivastigmine Tartrate] Other (See Comments)    "Allergic," per MAR  . Fosamax [Alendronate Sodium] Other (See Comments)    "Allergic," per MAR  . Galantamine Other (See Comments)    "Allergic," per MAR  . Lipitor [Atorvastatin] Other (See Comments)    "Allergic," per MAR  . Morphine And Related Other (See Comments)    "Allergic," per MAR  . Other Other (See Comments)    "Sedatives" and "Multiple, unknown B/P meds" = "Allergic," per MAR  . Vagifem [Estradiol] Other (See Comments)    "Allergic," per [facility]     PERTINENT MEDICATIONS:  Outpatient Encounter Medications as of 06/28/2019  Medication Sig  . acetaminophen (TYLENOL) 500 MG tablet Take 1,000 mg by mouth 2 (two) times daily.  Marland Kitchen aspirin 81 MG chewable tablet Chew 81 mg by mouth daily.  Marland Kitchen atorvastatin (LIPITOR) 20 MG tablet Take 20 mg by mouth at bedtime.   . calcium carbonate (CALCI-CHEW) 1250 (500 Ca) MG chewable tablet Chew 1 tablet by mouth 2 (two) times daily.  . Cholecalciferol (VITAMIN D3) 2000 units TABS Take 2,000 Units by mouth daily.  . cloNIDine (CATAPRES) 0.3 MG tablet Take 0.3 mg by mouth  2 (two) times daily.   . clopidogrel (PLAVIX) 75 MG tablet Take 1 tablet (75 mg total) by mouth daily.  . diclofenac sodium (VOLTAREN) 1 % GEL Apply 4 g topically See admin instructions. Apply 4 grams to both knees two times a day and an additional 4 grams to both knees two times a day as needed for arthritis pain  . ferrous sulfate 325 (65 FE) MG tablet Take 325 mg by mouth daily with breakfast.  . guaifenesin (ROBITUSSIN) 100 MG/5ML syrup Take 200 mg by mouth every 6 (six) hours as needed (for 3 days, for coughing and notify MD if coughs persists for more than 3 days).   Marland Kitchen levothyroxine (SYNTHROID, LEVOTHROID) 112 MCG  tablet Take 112 mcg by mouth daily before breakfast.  . lisinopril (PRINIVIL,ZESTRIL) 40 MG tablet Take 40 mg by mouth daily.  Marland Kitchen loperamide (IMODIUM A-D) 2 MG tablet Take 2 mg by mouth See admin instructions. Take 2 mg by mouth after each loose stool as needed/max humber of doses, up to 3/12 hours and notify MD if no relief after 24 hrs  . LORazepam (ATIVAN) 0.5 MG tablet Take 0.5 tablets (0.25 mg total) by mouth 2 (two) times daily as needed for anxiety.  . memantine (NAMENDA) 10 MG tablet Take 10 mg by mouth 2 (two) times daily.  . polyethylene glycol (MIRALAX / GLYCOLAX) packet Take 17 g by mouth daily.  . risperiDONE (RISPERDAL) 0.5 MG tablet Take 0.5 mg by mouth 2 (two) times daily.   . sertraline (ZOLOFT) 50 MG tablet Take 75 mg by mouth daily.   Marland Kitchen telmisartan (MICARDIS) 80 MG tablet Take 80 mg by mouth daily.  . vitamin B-12 (CYANOCOBALAMIN) 500 MCG tablet Take 500 mcg by mouth daily.    No facility-administered encounter medications on file as of 06/28/2019.     [place] Jenetta Downer, NP

## 2019-06-29 ENCOUNTER — Telehealth: Payer: Self-pay | Admitting: Adult Health Nurse Practitioner

## 2019-06-29 ENCOUNTER — Other Ambulatory Visit: Payer: Self-pay

## 2019-06-29 NOTE — Telephone Encounter (Signed)
Reached out to son to update him on telehealth visit yesterday.  Left VM with contact info. Yehya Brendle K. Olena Heckle NP

## 2019-10-04 ENCOUNTER — Inpatient Hospital Stay (HOSPITAL_COMMUNITY)
Admission: EM | Admit: 2019-10-04 | Discharge: 2019-10-09 | DRG: 871 | Disposition: A | Discharge status: Skilled Nursing Facility | Payer: Medicare Other | Source: Skilled Nursing Facility | Attending: Family Medicine | Admitting: Family Medicine

## 2019-10-04 ENCOUNTER — Emergency Department (HOSPITAL_COMMUNITY): Payer: Medicare Other

## 2019-10-04 DIAGNOSIS — N170 Acute kidney failure with tubular necrosis: Secondary | ICD-10-CM | POA: Diagnosis present

## 2019-10-04 DIAGNOSIS — Z7982 Long term (current) use of aspirin: Secondary | ICD-10-CM

## 2019-10-04 DIAGNOSIS — E870 Hyperosmolality and hypernatremia: Secondary | ICD-10-CM | POA: Diagnosis not present

## 2019-10-04 DIAGNOSIS — Z66 Do not resuscitate: Secondary | ICD-10-CM | POA: Diagnosis present

## 2019-10-04 DIAGNOSIS — U071 COVID-19: Secondary | ICD-10-CM

## 2019-10-04 DIAGNOSIS — I110 Hypertensive heart disease with heart failure: Secondary | ICD-10-CM | POA: Diagnosis present

## 2019-10-04 DIAGNOSIS — I5032 Chronic diastolic (congestive) heart failure: Secondary | ICD-10-CM | POA: Diagnosis present

## 2019-10-04 DIAGNOSIS — E039 Hypothyroidism, unspecified: Secondary | ICD-10-CM | POA: Diagnosis present

## 2019-10-04 DIAGNOSIS — I69320 Aphasia following cerebral infarction: Secondary | ICD-10-CM

## 2019-10-04 DIAGNOSIS — I10 Essential (primary) hypertension: Secondary | ICD-10-CM | POA: Diagnosis present

## 2019-10-04 DIAGNOSIS — Z79899 Other long term (current) drug therapy: Secondary | ICD-10-CM | POA: Diagnosis not present

## 2019-10-04 DIAGNOSIS — F329 Major depressive disorder, single episode, unspecified: Secondary | ICD-10-CM | POA: Diagnosis present

## 2019-10-04 DIAGNOSIS — E785 Hyperlipidemia, unspecified: Secondary | ICD-10-CM | POA: Diagnosis present

## 2019-10-04 DIAGNOSIS — Z8249 Family history of ischemic heart disease and other diseases of the circulatory system: Secondary | ICD-10-CM | POA: Diagnosis not present

## 2019-10-04 DIAGNOSIS — Q211 Atrial septal defect: Secondary | ICD-10-CM

## 2019-10-04 DIAGNOSIS — Z888 Allergy status to other drugs, medicaments and biological substances status: Secondary | ICD-10-CM | POA: Diagnosis not present

## 2019-10-04 DIAGNOSIS — A4189 Other specified sepsis: Secondary | ICD-10-CM | POA: Diagnosis present

## 2019-10-04 DIAGNOSIS — G9340 Encephalopathy, unspecified: Secondary | ICD-10-CM | POA: Diagnosis present

## 2019-10-04 DIAGNOSIS — R2231 Localized swelling, mass and lump, right upper limb: Secondary | ICD-10-CM | POA: Diagnosis not present

## 2019-10-04 DIAGNOSIS — Z885 Allergy status to narcotic agent status: Secondary | ICD-10-CM

## 2019-10-04 DIAGNOSIS — F0150 Vascular dementia without behavioral disturbance: Secondary | ICD-10-CM | POA: Diagnosis present

## 2019-10-04 DIAGNOSIS — G9341 Metabolic encephalopathy: Secondary | ICD-10-CM | POA: Diagnosis present

## 2019-10-04 DIAGNOSIS — N179 Acute kidney failure, unspecified: Secondary | ICD-10-CM | POA: Diagnosis not present

## 2019-10-04 DIAGNOSIS — M7989 Other specified soft tissue disorders: Secondary | ICD-10-CM | POA: Diagnosis not present

## 2019-10-04 DIAGNOSIS — E860 Dehydration: Secondary | ICD-10-CM | POA: Diagnosis not present

## 2019-10-04 DIAGNOSIS — E8729 Other acidosis: Secondary | ICD-10-CM

## 2019-10-04 DIAGNOSIS — F0390 Unspecified dementia without behavioral disturbance: Secondary | ICD-10-CM | POA: Diagnosis not present

## 2019-10-04 DIAGNOSIS — Z96651 Presence of right artificial knee joint: Secondary | ICD-10-CM | POA: Diagnosis present

## 2019-10-04 DIAGNOSIS — E878 Other disorders of electrolyte and fluid balance, not elsewhere classified: Secondary | ICD-10-CM | POA: Diagnosis present

## 2019-10-04 DIAGNOSIS — Z993 Dependence on wheelchair: Secondary | ICD-10-CM

## 2019-10-04 DIAGNOSIS — Z79890 Hormone replacement therapy (postmenopausal): Secondary | ICD-10-CM

## 2019-10-04 DIAGNOSIS — Z7902 Long term (current) use of antithrombotics/antiplatelets: Secondary | ICD-10-CM | POA: Diagnosis not present

## 2019-10-04 DIAGNOSIS — E872 Acidosis: Secondary | ICD-10-CM

## 2019-10-04 LAB — URINALYSIS, ROUTINE W REFLEX MICROSCOPIC
Bilirubin Urine: NEGATIVE
Glucose, UA: NEGATIVE mg/dL
Hgb urine dipstick: NEGATIVE
Ketones, ur: NEGATIVE mg/dL
Nitrite: NEGATIVE
Protein, ur: 30 mg/dL — AB
Specific Gravity, Urine: 1.019 (ref 1.005–1.030)
pH: 5 (ref 5.0–8.0)

## 2019-10-04 LAB — CBC WITH DIFFERENTIAL/PLATELET
Abs Immature Granulocytes: 0.07 10*3/uL (ref 0.00–0.07)
Basophils Absolute: 0 10*3/uL (ref 0.0–0.1)
Basophils Relative: 0 %
Eosinophils Absolute: 0 10*3/uL (ref 0.0–0.5)
Eosinophils Relative: 0 %
HCT: 48.6 % — ABNORMAL HIGH (ref 36.0–46.0)
Hemoglobin: 15 g/dL (ref 12.0–15.0)
Immature Granulocytes: 1 %
Lymphocytes Relative: 16 %
Lymphs Abs: 2.4 10*3/uL (ref 0.7–4.0)
MCH: 30.8 pg (ref 26.0–34.0)
MCHC: 30.9 g/dL (ref 30.0–36.0)
MCV: 99.8 fL (ref 80.0–100.0)
Monocytes Absolute: 0.9 10*3/uL (ref 0.1–1.0)
Monocytes Relative: 6 %
Neutro Abs: 11 10*3/uL — ABNORMAL HIGH (ref 1.7–7.7)
Neutrophils Relative %: 77 %
Platelets: 256 10*3/uL (ref 150–400)
RBC: 4.87 MIL/uL (ref 3.87–5.11)
RDW: 15.4 % (ref 11.5–15.5)
WBC: 14.4 10*3/uL — ABNORMAL HIGH (ref 4.0–10.5)
nRBC: 0 % (ref 0.0–0.2)

## 2019-10-04 LAB — COMPREHENSIVE METABOLIC PANEL
ALT: 15 U/L (ref 0–44)
AST: 30 U/L (ref 15–41)
Albumin: 4 g/dL (ref 3.5–5.0)
Alkaline Phosphatase: 78 U/L (ref 38–126)
Anion gap: 21 — ABNORMAL HIGH (ref 5–15)
BUN: 110 mg/dL — ABNORMAL HIGH (ref 8–23)
CO2: 15 mmol/L — ABNORMAL LOW (ref 22–32)
Calcium: 8.9 mg/dL (ref 8.9–10.3)
Chloride: 127 mmol/L — ABNORMAL HIGH (ref 98–111)
Creatinine, Ser: 5.47 mg/dL — ABNORMAL HIGH (ref 0.44–1.00)
GFR calc Af Amer: 7 mL/min — ABNORMAL LOW (ref >60)
GFR calc non Af Amer: 6 mL/min — ABNORMAL LOW (ref >60)
Glucose, Bld: 106 mg/dL — ABNORMAL HIGH (ref 70–99)
Potassium: 5.3 mmol/L — ABNORMAL HIGH (ref 3.5–5.1)
Sodium: 163 mmol/L (ref 135–145)
Total Bilirubin: 0.8 mg/dL (ref 0.3–1.2)
Total Protein: 7.2 g/dL (ref 6.5–8.1)

## 2019-10-04 LAB — TRIGLYCERIDES: Triglycerides: 294 mg/dL — ABNORMAL HIGH (ref <150)

## 2019-10-04 LAB — FERRITIN: Ferritin: 508 ng/mL — ABNORMAL HIGH (ref 11–307)

## 2019-10-04 LAB — LACTATE DEHYDROGENASE: LDH: 286 U/L — ABNORMAL HIGH (ref 98–192)

## 2019-10-04 LAB — FIBRINOGEN: Fibrinogen: 610 mg/dL — ABNORMAL HIGH (ref 210–475)

## 2019-10-04 LAB — POC SARS CORONAVIRUS 2 AG -  ED: SARS Coronavirus 2 Ag: POSITIVE — AB

## 2019-10-04 LAB — C-REACTIVE PROTEIN: CRP: 7.7 mg/dL — ABNORMAL HIGH (ref <1.0)

## 2019-10-04 LAB — LACTIC ACID, PLASMA: Lactic Acid, Venous: 1.7 mmol/L (ref 0.5–1.9)

## 2019-10-04 LAB — D-DIMER, QUANTITATIVE: D-Dimer, Quant: 6.26 ug/mL-FEU — ABNORMAL HIGH (ref 0.00–0.50)

## 2019-10-04 MED ORDER — ONDANSETRON HCL 4 MG PO TABS: 4.0000 mg | ORAL_TABLET | Freq: Four times a day (QID) | ORAL | Status: DC | PRN

## 2019-10-04 MED ORDER — GUAIFENESIN-DM 100-10 MG/5ML PO SYRP: 10.0000 mL | ORAL_SOLUTION | ORAL | Status: DC | PRN

## 2019-10-04 MED ORDER — ENOXAPARIN SODIUM 40 MG/0.4ML ~~LOC~~ SOLN
40.0000 mg | Freq: Every day | SUBCUTANEOUS | Status: DC
  Administered 2019-10-05: 40 mg via SUBCUTANEOUS

## 2019-10-04 MED ORDER — DEXTROSE-NACL 5-0.45 % IV SOLN: INTRAVENOUS | Status: DC

## 2019-10-04 MED ORDER — SODIUM CHLORIDE 0.9 % IV BOLUS
1000.0000 mL | Freq: Once | INTRAVENOUS | Status: AC
  Administered 2019-10-04: 1000 mL via INTRAVENOUS

## 2019-10-04 MED ORDER — ACETAMINOPHEN 325 MG PO TABS: 650.0000 mg | ORAL_TABLET | Freq: Four times a day (QID) | ORAL | Status: DC | PRN

## 2019-10-04 MED ORDER — ONDANSETRON HCL 4 MG/2ML IJ SOLN: 4.0000 mg | Freq: Four times a day (QID) | INTRAMUSCULAR | Status: DC | PRN

## 2019-10-04 MED ORDER — THIAMINE HCL 100 MG/ML IJ SOLN
100.0000 mg | Freq: Every day | INTRAMUSCULAR | Status: DC
  Administered 2019-10-05 – 2019-10-09 (×6): 100 mg via INTRAVENOUS
  Filled 2019-10-04 (×5): qty 2

## 2019-10-04 MED ORDER — SODIUM CHLORIDE 0.9% FLUSH
3.0000 mL | Freq: Once | INTRAVENOUS | Status: AC
  Administered 2019-10-04: 3 mL via INTRAVENOUS

## 2019-10-04 MED ORDER — SODIUM CHLORIDE 0.9% FLUSH
3.0000 mL | Freq: Two times a day (BID) | INTRAVENOUS | Status: DC
  Administered 2019-10-04 – 2019-10-09 (×7): 3 mL via INTRAVENOUS

## 2019-10-04 NOTE — H&P (Signed)
Cathy Johnson VAP:[id number] DOB: [birth year] DOA: 10/04/2019     PCP: Orvis Brill, [facility]   Outpatient Specialists:   NONE   Patient arrived to ER on 10/04/19 at 1432  Patient coming from:  From facility [facility]  Chief Complaint:   Chief Complaint  Patient presents with   covid/ hypotension   COVID +    HPI: Cathy Johnson is a 85 y.o. female with medical history significant of  COVID  hx of previous stroke aphasia, dementia, HTN, PFO   Presented with decreased PO intake hypotension, sent from [facility] per staff patient not eating well ever since her diagnosis of Covid 3 days ago At her baseline she non-ambulatory Her speech is difficult to understand at baseline. When EMS arrived    Hypotensive 70/50 HR 110 RR 22 Right arm swelling just per nursing staff at [facility] unclear etiology    Infectious risk factors:  Reports shortness of breath, decreased Po intake   KNOWN COVID POSITIVE   In  ER RAPID COVID TEST    POSITIVE,    No results found for: SARSCOV2NAA   Regarding pertinent Chronic problems:     Hyperlipidemia -  on statins Lipitor   HTN on clonidin, lisinopril  echo in 2019  LV EF: 55% -   60%   chronic CHF diastolic  - last echo 1438 (grade 1 diastolic dysfunction). LV EF: 55% -   60%  Dementia on Nemenda     Hypothyroidism:  Lab Results  Component Value Date   TSH 3.282 11/01/2018   on synthroid   Hx of CVA -  With  residual deficits on Aspirin 81 mg,  Plavix     While in ER:  Right arm neg for DVT NA 163 Cr 5.47    The following Work up has been ordered so far:  Orders Placed This Encounter  Procedures   Blood Culture (routine x 2)   DG Chest Port 1 View   Urinalysis, Routine w reflex microscopic   Lactic acid, plasma   CBC WITH DIFFERENTIAL   Comprehensive metabolic panel   D-dimer, quantitative   Lactate dehydrogenase   Ferritin   Triglycerides   Fibrinogen   C-reactive  protein   Diet NPO time specified   Cardiac monitoring   Saline Lock IV, Maintain IV access   Cardiac monitoring   Initiate Carrier Fluid Protocol   Place surgical mask on patient   Patient to wear surgical mask during transportation   Assess patient for ability to self-prone. If able (can move self in bed, ambulate) and stable (SpO2 and oxygen requirement):   RN/NT - Document specific oxygen requirements in [facility]   Notify EDP if new oxygen requirements escalates > 4L per minute [place]   RN to draw the following extra tubes:   Vital signs   Consult for Unassigned Medical Admission  ALL PATIENTS BEING ADMITTED/HAVING PROCEDURES NEED COVID-19 SCREENING   Airborne and Contact precautions   Pulse oximetry, continuous   Pulse oximetry, continuous   POC SARS Coronavirus 2 Ag-ED - Nasal Swab (BD Veritor Kit)   ED EKG   EKG 12-Lead   ED EKG 12-Lead   EKG 12-Lead   UE VENOUS DUPLEX ([facility] & [facility] 7 am - 7 pm)     Following Medications were ordered in ER: Medications  sodium chloride flush (NS) 0.9 % injection 3 mL (3 mLs Intravenous Given 10/04/19 1945)  sodium chloride 0.9 % bolus 1,000 mL (1,000  mLs Intravenous New Bag/Given 10/04/19 1915)    Significant initial  Findings: Abnormal Labs Reviewed  CBC WITH DIFFERENTIAL/PLATELET - Abnormal; Notable for the following components:      Result Value   WBC 14.4 (*)    HCT 48.6 (*)    Neutro Abs 11.0 (*)    All other components within normal limits  COMPREHENSIVE METABOLIC PANEL - Abnormal; Notable for the following components:   Sodium 163 (*)    Potassium 5.3 (*)    Chloride 127 (*)    CO2 15 (*)    Glucose, Bld 106 (*)    BUN 110 (*)    Creatinine, Ser 5.47 (*)    GFR calc non Af Amer 6 (*)    GFR calc Af Amer 7 (*)    Anion gap 21 (*)    All other components within normal limits  D-DIMER, QUANTITATIVE (NOT AT [facility]) - Abnormal; Notable for the following components:   D-Dimer, Quant 6.26 (*)    All other components  within normal limits  LACTATE DEHYDROGENASE - Abnormal; Notable for the following components:   LDH 286 (*)    All other components within normal limits  FERRITIN - Abnormal; Notable for the following components:   Ferritin 508 (*)    All other components within normal limits  TRIGLYCERIDES - Abnormal; Notable for the following components:   Triglycerides 294 (*)    All other components within normal limits  FIBRINOGEN - Abnormal; Notable for the following components:   Fibrinogen 610 (*)    All other components within normal limits  C-REACTIVE PROTEIN - Abnormal; Notable for the following components:   CRP 7.7 (*)    All other components within normal limits  POC SARS CORONAVIRUS 2 AG -  ED - Abnormal; Notable for the following components:   SARS Coronavirus 2 Ag POSITIVE (*)    All other components within normal limits     Otherwise labs showing:    Recent Labs  Lab 10/04/19 1822  NA 163*  K 5.3*  CO2 15*  GLUCOSE 106*  BUN 110*  CREATININE 5.47*  CALCIUM 8.9    Cr     Up from baseline see below Lab Results  Component Value Date   CREATININE 5.47 (H) 10/04/2019   CREATININE 0.79 11/02/2018   CREATININE 1.41 (H) 11/01/2018    Recent Labs  Lab 10/04/19 1822  AST 30  ALT 15  ALKPHOS 78  BILITOT 0.8  PROT 7.2  ALBUMIN 4.0   Lab Results  Component Value Date   CALCIUM 8.9 10/04/2019   PHOS 3.3 07/21/2018      WBC      Component Value Date/Time   WBC 14.4 (H) 10/04/2019 1822   ANC    Component Value Date/Time   NEUTROABS 11.0 (H) 10/04/2019 1822   ALC No components found for: LYMPHAB    Plt: Lab Results  Component Value Date   PLT 256 10/04/2019     Lactic Acid, Venous    Component Value Date/Time   LATICACIDVEN 1.7 10/04/2019 1822    Procalcitonin   Ordered   COVID-19 Labs  Recent Labs    10/04/19 1822  DDIMER 6.26*  FERRITIN 508*  LDH 286*  CRP 7.7*    No results found for: SARSCOV2NAA     HG/HCT  Stable       Component Value Date/Time   HGB 15.0 10/04/2019 1822   HCT 48.6 (H) 10/04/2019 1822  ECG: Ordered Personally reviewed by me showing: HR : 121 Rhythm: Sinus tachycardia     no evidence of ischemic changes QTC 483   BNP (last 3 results) Recent Labs    11/01/18 1310  BNP 161.0*     UA    ordered    Ordered  CXR -minimal residual right basilar atelectasis.        ED Triage Vitals  Enc Vitals Group     BP 10/04/19 1700 (!) 118/94     Pulse Rate 10/04/19 1502 (!) 112     Resp 10/04/19 1502 18     Temp 10/04/19 1502 98.6 F (37 C)     Temp Source 10/04/19 1502 Oral     SpO2 10/04/19 1502 98 %     Weight --      Height --      Head Circumference --      Peak Flow --      Pain Score --      Pain Loc --      Pain Edu? --      Excl. in [place]? --   TMAX(24)@       Latest  Blood pressure (!) 138/111, pulse (!) 109, temperature 99.2 F (37.3 C), temperature source Rectal, resp. rate (!) 27, SpO2 99 %.    Hospitalist was called for admission for COVID infection, hypernatremia, AKI   Review of Systems:    Pertinent positives include: fatigue, loss of appetite,  Constitutional:  No weight loss, night sweats, Fevers, chills, weight loss  HEENT:  No headaches, Difficulty swallowing,Tooth/dental problems,Sore throat,  No sneezing, itching, ear ache, nasal congestion, post nasal drip,  Cardio-vascular:  No chest pain, Orthopnea, PND, anasarca, dizziness, palpitations.no Bilateral lower extremity swelling  GI:  No heartburn, indigestion, abdominal pain, nausea, vomiting, diarrhea, change in bowel habits,  melena, blood in stool, hematemesis Resp:  no shortness of breath at rest. No dyspnea on exertion, No excess mucus, no productive cough, No non-productive cough, No coughing up of blood.No change in color of mucus.No wheezing. Skin:  no rash or lesions. No jaundice GU:  no dysuria, change in color of urine, no urgency or frequency. No straining to urinate.  No  flank pain.  Musculoskeletal:  No joint pain or no joint swelling. No decreased range of motion. No back pain.  Psych:  No change in mood or affect. No depression or anxiety. No memory loss.  Neuro: no localizing neurological complaints, no tingling, no weakness, no double vision, no gait abnormality, no slurred speech, no confusion  All systems reviewed and apart from [place] all are negative  Past Medical History:   Past Medical History:  Diagnosis Date   Arthritis    Dementia ([place])    Hypertension    Osteoarthritis of right knee 05/02/2014   PFO (patent foramen ovale)    PFO by bubble study 02/05/11 TEE   Stroke [facility])       Past Surgical History:  Procedure Laterality Date   BREAST SURGERY     LT BREAST MASS REMOVED   CARPAL TUNNEL RELEASE     KNEE ARTHROSCOPY     RIGHT   PARTIAL KNEE ARTHROPLASTY Right 05/02/2014   Procedure: UNICOMPARTMENTAL KNEE;  Surgeon: Johnny Bridge, MD;  Location: [place];  Service: Orthopedics;  Laterality: Right;    Social History:  Ambulatory wheelchair bound,      reports that she has never smoked. She has never used smokeless tobacco. She reports that she does  not drink alcohol or use drugs.   Family History:   Family History  Problem Relation Age of Onset   Hypertension Mother    Hypertension Father     Allergies: Allergies  Allergen Reactions   Boniva [Ibandronic Acid] Other (See Comments)    "Allergic," per [facility]   Crestor [Rosuvastatin Calcium] Other (See Comments)    "Allergic," per [facility]   Exelon [Rivastigmine Tartrate] Other (See Comments)    "Allergic," per [facility]   Fosamax [Alendronate Sodium] Other (See Comments)    "Allergic," per [facility]   Galantamine Other (See Comments)    "Allergic," per [facility]   Lipitor [Atorvastatin] Other (See Comments)    "Allergic," per [facility]   Morphine And Related Other (See Comments)    "Allergic," per [facility]   Other Other (See Comments)    "Sedatives" and "Multiple, unknown B/P meds"  = "Allergic," per [facility]   Vagifem [Estradiol] Other (See Comments)    "Allergic," per [facility]     Prior to Admission medications   Medication Sig Start Date End Date Taking? Authorizing Provider  acetaminophen (TYLENOL) 500 MG tablet Take 1,000 mg by mouth 2 (two) times daily.    [provider]  aspirin 81 MG chewable tablet Chew 81 mg by mouth daily.    [provider]  atorvastatin (LIPITOR) 20 MG tablet Take 20 mg by mouth at bedtime.     [provider]  calcium carbonate (CALCI-CHEW) 1250 (500 Ca) MG chewable tablet Chew 1 tablet by mouth 2 (two) times daily.    [provider]  Cholecalciferol (VITAMIN D3) 2000 units TABS Take 2,000 Units by mouth daily.    [provider]  cloNIDine (CATAPRES) 0.3 MG tablet Take 0.3 mg by mouth 2 (two) times daily.     [provider]  clopidogrel (PLAVIX) 75 MG tablet Take 1 tablet (75 mg total) by mouth daily. 03/09/17   Gladstone Lighter, MD  diclofenac sodium (VOLTAREN) 1 % GEL Apply 4 g topically See admin instructions. Apply 4 grams to both knees two times a day and an additional 4 grams to both knees two times a day as needed for arthritis pain    [provider]  ferrous sulfate 325 (65 FE) MG tablet Take 325 mg by mouth daily with breakfast.    [provider]  guaifenesin (ROBITUSSIN) 100 MG/5ML syrup Take 200 mg by mouth every 6 (six) hours as needed (for 3 days, for coughing and notify MD if coughs persists for more than 3 days).     [provider]  levothyroxine (SYNTHROID, LEVOTHROID) 112 MCG tablet Take 112 mcg by mouth daily before breakfast.    [provider]  lisinopril (PRINIVIL,ZESTRIL) 40 MG tablet Take 40 mg by mouth daily.    [provider]  loperamide (IMODIUM A-D) 2 MG tablet Take 2 mg by mouth See admin instructions. Take 2 mg by mouth after each loose stool as needed/max humber of doses, up to 3/12 hours and notify MD if no relief  after 24 hrs    [provider]  LORazepam (ATIVAN) 0.5 MG tablet Take 0.5 tablets (0.25 mg total) by mouth 2 (two) times daily as needed for anxiety. 11/05/18   Henreitta Leber, MD  memantine (NAMENDA) 10 MG tablet Take 10 mg by mouth 2 (two) times daily.    [provider]  polyethylene glycol (MIRALAX / GLYCOLAX) packet Take 17 g by mouth daily.    [provider]  risperiDONE (RISPERDAL)  0.5 MG tablet Take 0.5 mg by mouth 2 (two) times daily.     [provider]  sertraline (ZOLOFT) 50 MG tablet Take 75 mg by mouth daily.     [provider]  telmisartan (MICARDIS) 80 MG tablet Take 80 mg by mouth daily.    [provider]  vitamin B-12 (CYANOCOBALAMIN) 500 MCG tablet Take 500 mcg by mouth daily.     [provider]   Physical Exam: Blood pressure (!) 138/111, pulse (!) 109, temperature 99.2 F (37.3 C), temperature source Rectal, resp. rate (!) 27, SpO2 99 %. 1. General:  in No  Acute distress   acutely ill -appearing 2. Psychological: somnolent not Oriented 3. Head/ENT:    Dry Mucous Membranes                          Head Non traumatic, neck supple                           Poor Dentition 4. SKIN:  decreased Skin turgor,  Skin clean Dry and intact no rash 5. Heart: Regular rate and rhythm no  Murmur, no Rub or gallop 6. Lungs:  no wheezes or crackles   7. Abdomen: Soft,  non-tender, Non distended  bowel sounds present 8. Lower extremities: no clubbing, cyanosis, no edema 9. Neurologically non co-opertive with exam  10. MSK: Normal range of motion   All other LABS:     Recent Labs  Lab 10/04/19 1822  WBC 14.4*  NEUTROABS 11.0*  HGB 15.0  HCT 48.6*  MCV 99.8  PLT 256     Recent Labs  Lab 10/04/19 1822  NA 163*  K 5.3*  CL 127*  CO2 15*  GLUCOSE 106*  BUN 110*  CREATININE 5.47*  CALCIUM 8.9     Recent Labs  Lab 10/04/19 1822  AST 30  ALT 15  ALKPHOS 78  BILITOT 0.8  PROT 7.2  ALBUMIN 4.0         Cultures:    Component Value Date/Time   SDES BLOOD RIGHT HAND 07/20/2018 2346   SDES  07/20/2018 2346    BLOOD LEFT ANTECUBITAL Performed at [facility], [address] [place], [place] [address]    [facility]  07/20/2018 2346    BOTTLES DRAWN AEROBIC AND ANAEROBIC Blood Culture adequate volume   SPECREQUEST  07/20/2018 2346    BOTTLES DRAWN AEROBIC AND ANAEROBIC Blood Culture adequate volume Performed at [facility], [address] [place], [place] [address]    CULT  07/20/2018 2346    NO GROWTH 5 DAYS Performed at [facility], [address] [place], [place] [address]    CULT (A) 07/20/2018 2346    STAPHYLOCOCCUS SPECIES (COAGULASE NEGATIVE) THE SIGNIFICANCE OF ISOLATING THIS ORGANISM FROM A SINGLE SET OF BLOOD CULTURES WHEN MULTIPLE SETS ARE DRAWN IS UNCERTAIN. PLEASE NOTIFY THE MICROBIOLOGY DEPARTMENT WITHIN ONE WEEK IF SPECIATION AND SENSITIVITIES ARE REQUIRED. Performed at [facility], [place] [address], [place], [place] [address]    REPTSTATUS 07/25/2018 FINAL 07/20/2018 2346   REPTSTATUS 07/23/2018 FINAL 07/20/2018 2346     Radiological Exams on Admission: DG Chest Port 1 View  Result Date: 10/04/2019 CLINICAL DATA:  Hypotension. Possible right arm DVT. Shortness of breath with hypoxia. COVID-19 infection. EXAM: PORTABLE CHEST 1 VIEW COMPARISON:  Radiographs 11/01/2018 and 08/05/2018. FINDINGS: 1533 hours. There is improved aeration of the lungs  compared with the previous study with minimal residual right basilar atelectasis. No significant confluent or ground-glass pulmonary opacities. The heart size and mediastinal contours are stable. There is mild aortic atherosclerosis. No pleural effusion or pneumothorax. Telemetry leads overlie the chest. IMPRESSION: Improved aeration of the lungs with minimal residual right basilar atelectasis. No new findings. Electronically Signed   By: Richardean Sale M.D.   On: 10/04/2019 16:05     Chart has been reviewed    Assessment/Plan  85 y.o. female with medical history significant of  COVID  hx of previous stroke aphasia, dementia, HTN, PFO Admitted for COVID infection, AKI, hypernatremia  Present on Admission:  COVID-19 virus infection -at this point no evidence of pulmonary involvement no hypoxia.  Evidence of dehydration.  Continue to monitor rehydrate as needed. Discussed with family who understands overall poor prognosis.  Continue with IV fluids.  Okay with antibiotics if there is any evidence of bacterial infection. Avoid all aggressive interventions MOST form is on file Follow daily inflammatory markers   Hypernatremia -likely secondary to severe dehydration.  Will rehydrate with half normal saline and monitor sodium levels avoid over aggressive correction   Hypertension -hold off on home medications for now  Hypothyroidism -restart Synthroid when able to tolerate  AKI (acute kidney injury) ([place]) -obtain urine electrolytes and rehydrate  Dementia ([place]) -likely contributing to significance of decompensation.  Patient with overall poor prognosis expect some degree of sundowning restart home medications when able to tolerate p.o.'s    Acute encephalopathy most likely secondary to severe dehydration hypernatremia, administer IV thiamin   Dehydration-  we will rehydrate and follow fluid status   Other plan as per orders.  DVT prophylaxis:    Lovenox     Code Status:  DNR/DNI  as per family cussed CODE STATUS and goals of care extensively with son Family request they will be called with any new findings please call family in the morning to discuss her patient progressing I had personally discussed CODE STATUS with   Family    Overall very poor prognosis given history of underlying severe dementia significant AKI and Covid infection Family Communication:   Family not at  Bedside  plan of care was discussed on the phone with   Son,   Disposition  Plan:                             Back to current facility when stable                                                Consults called: none   Admission status:  ED Disposition    None        inpatient     Expect 2 midnight stay secondary to severity of patient's current illness including   hemodynamic instability despite optimal treatment (tachycardia  hypotension   Tachypnea )  Severe lab/radiological/exam abnormalities including: hypernatremia   and extensive comorbidities including:    dementia    history of stroke with residual deficits   That are currently affecting medical management.   I expect  patient to be hospitalized for 2 midnights requiring inpatient medical care.  Patient is at high risk for adverse outcome (such as loss of life or disability) if not treated.  Indication for inpatient stay as  follows:  Severe change from baseline regarding mental status Hemodynamic instability despite maximal medical therapy,    inability to maintain oral hydration     Need for  IV fluids,       Level of care   tele  For 24H  please discontinue once patient no longer qualifies   Precautions: admitted as  covid positive Airborne and Contact precautions    PPE: Used by the provider:   P100  eye Goggles,  Gloves  gown   Kaidan Spengler 10/04/2019, 9:51 PM    [facility]     after 2 AM please page floor coverage PA If 7AM-7PM, please contact the day team taking care of the patient using [email or web address]

## 2019-10-04 NOTE — Progress Notes (Signed)
Right upper ext venous completed. Negative for DVT. Oda Cogan, BS, RDMS, RVT

## 2019-10-04 NOTE — ED Notes (Signed)
Pt. has a purewick on, and has been unable to urinate.

## 2019-10-04 NOTE — ED Provider Notes (Signed)
[facility] EMERGENCY DEPARTMENT Provider Note   CSN: [id number] Arrival date & time: 10/04/19  1432     History Chief Complaint  Patient presents with  . covid/ hypotension    Cathy Johnson is a 85 y.o. female.  HPI     85yo female with history of CVA, dementia, hypertension, recent COVID19 diagnosis, presents from [facility] with concern for hypotension and possible RUE DVT.  She is nonverbal from previous stroke per facility/EMS-family has not seen her since March but son does report she was able to speak nonsensically, difficult to understand but has been worseng.  BP was 70/50 with HR 110, EMS gave 250cc NS and BP 97/64 prior to arrival.  Has MOST form, DNR/DNI, reports "comfort care-no hospital transport" but also states IV abx and IV fluids desired for care when indicated.  History very limited as I am unable to get in touch with nursing despite attempt at [facility] place. Given number [id number] for RN Robina Ade.  Saturday 101 temperature, early Saturday evening diagnosed with COVID19, had cough and appeared to have soreness  CVA 2014, will speak, answer questions and will start talking about things and can be difficult to understand, speech and mobility declining. Has been at [facility] for about one year.  Gradual decline.   Appetite has been great up until this weekend with diagnosis.  Past Medical History:  Diagnosis Date  . Arthritis   . Dementia ([place])   . Hypertension   . Osteoarthritis of right knee 05/02/2014  . PFO (patent foramen ovale)    PFO by bubble study 02/05/11 TEE  . Stroke [facility])     Patient Active Problem List   Diagnosis Date Noted  . COVID-19 10/05/2019  . COVID-19 virus infection 10/04/2019  . Hypernatremia 10/04/2019  . Dehydration 10/04/2019  . Acute encephalopathy 11/01/2018  . CVA (cerebral vascular accident) ([place]) 09/18/2018  . Elevated alkaline phosphatase level 08/06/2018  . Hypothyroidism 08/06/2018  . AMS  (altered mental status) 08/05/2018  . Acute CVA (cerebrovascular accident) ([place]) 08/05/2018  . Altered mental status 07/21/2018  . Facial droop 03/06/2017  . AKI (acute kidney injury) ([place]) 01/06/2016  . Syncope 01/06/2016  . Dementia ([place]) 01/06/2016  . Hypertension 01/06/2016  . Anemia 01/06/2016  . Osteoarthritis of right knee 05/02/2014  . Knee osteoarthritis 05/02/2014    Past Surgical History:  Procedure Laterality Date  . BREAST SURGERY     LT BREAST MASS REMOVED  . CARPAL TUNNEL RELEASE    . KNEE ARTHROSCOPY     RIGHT  . PARTIAL KNEE ARTHROPLASTY Right 05/02/2014   Procedure: UNICOMPARTMENTAL KNEE;  Surgeon: Johnny Bridge, MD;  Location: [facility];  Service: Orthopedics;  Laterality: Right;     OB History   No obstetric history on file.     Family History  Problem Relation Age of Onset  . Hypertension Mother   . Hypertension Father     Social History   Tobacco Use  . Smoking status: Never Smoker  . Smokeless tobacco: Never Used  Substance Use Topics  . Alcohol use: No  . Drug use: No    Home Medications Prior to Admission medications   Medication Sig Start Date End Date Taking? Authorizing Provider  acetaminophen (TYLENOL) 325 MG tablet Take 650 mg by mouth 3 (three) times daily.    Yes [provider]  amLODipine (NORVASC) 2.5 MG tablet Take 2.5 mg by mouth daily.   Yes [provider]  aspirin 81 MG  chewable tablet Chew 81 mg by mouth daily.   Yes [provider]  b complex vitamins capsule Take 1 capsule by mouth daily.   Yes [provider]  calcium carbonate (CALCI-CHEW) 1250 (500 Ca) MG chewable tablet Chew 1 tablet by mouth 2 (two) times daily.   Yes [provider]  Cholecalciferol (VITAMIN D3) 2000 units TABS Take 2,000 Units by mouth daily.   Yes [provider]  cloNIDine (CATAPRES) 0.2 MG tablet Take 0.2 mg by mouth 2 (two) times daily.    Yes [provider]  clopidogrel (PLAVIX) 75  MG tablet Take 1 tablet (75 mg total) by mouth daily. 03/09/17  Yes Gladstone Lighter, MD  diclofenac sodium (VOLTAREN) 1 % GEL Apply 4 g topically See admin instructions. Apply 4 grams to both knees two times a day and an additional 4 grams to both knees two times a day as needed for arthritis pain   Yes [provider]  diclofenac Sodium (VOLTAREN) 1 % GEL Apply 4 g topically 2 (two) times daily as needed (knee pain).   Yes [provider]  ferrous sulfate 325 (65 FE) MG tablet Take 325 mg by mouth daily with breakfast.   Yes [provider]  levothyroxine (SYNTHROID, LEVOTHROID) 112 MCG tablet Take 112 mcg by mouth daily before breakfast.   Yes [provider]  LORazepam (ATIVAN) 0.5 MG tablet Take 0.5 tablets (0.25 mg total) by mouth 2 (two) times daily as needed for anxiety. Patient taking differently: Take 0.5 mg by mouth daily. agitation 11/05/18  Yes Sainani, Belia Heman, MD  memantine (NAMENDA) 5 MG tablet Take 5 mg by mouth 2 (two) times daily.    Yes [provider]  polyethylene glycol (MIRALAX / GLYCOLAX) packet Take 17 g by mouth daily.   Yes [provider]  sertraline (ZOLOFT) 100 MG tablet Take 100 mg by mouth daily.    Yes [provider]  telmisartan (MICARDIS) 80 MG tablet Take 80 mg by mouth daily.   Yes [provider]  atorvastatin (LIPITOR) 20 MG tablet Take 20 mg by mouth at bedtime.     [provider]  guaifenesin (ROBITUSSIN) 100 MG/5ML syrup Take 200 mg by mouth every 6 (six) hours as needed (for 3 days, for coughing and notify MD if coughs persists for more than 3 days).     [provider]  lisinopril (PRINIVIL,ZESTRIL) 40 MG tablet Take 40 mg by mouth daily.    [provider]  loperamide (IMODIUM A-D) 2 MG tablet Take 2 mg by mouth See admin instructions. Take 2 mg by mouth after each loose stool as needed/max humber of doses, up to 3/12 hours and notify MD if no relief after  24 hrs    [provider]  risperiDONE (RISPERDAL) 0.5 MG tablet Take 0.5 mg by mouth 2 (two) times daily.     [provider]  vitamin B-12 (CYANOCOBALAMIN) 500 MCG tablet Take 500 mcg by mouth daily.     [provider]    Allergies    Boniva [ibandronic acid], Crestor [rosuvastatin calcium], Exelon [rivastigmine tartrate], Fosamax [alendronate sodium], Galantamine, Lipitor [atorvastatin], Morphine and related, Other, and Vagifem [estradiol]  Review of Systems   Review of Systems  Unable to perform ROS: Dementia    Physical Exam Updated Vital Signs BP (!) 143/79   Pulse 100   Temp 99.2 F (37.3 C) (Rectal)   Resp 19   SpO2 96%   Physical Exam Vitals  and nursing note reviewed.  Constitutional:      General: She is not in acute distress.    Appearance: She is well-developed. She is not diaphoretic.  HENT:     Head: Normocephalic and atraumatic.  Eyes:     Conjunctiva/sclera: Conjunctivae normal.  Cardiovascular:     Rate and Rhythm: Normal rate and regular rhythm.  Pulmonary:     Effort: Pulmonary effort is normal. No respiratory distress.     Breath sounds: Normal breath sounds.  Abdominal:     General: There is no distension.     Palpations: Abdomen is soft.     Tenderness: There is no abdominal tenderness. There is no guarding.  Musculoskeletal:        General: Swelling (RUE) present. No tenderness.     Cervical back: Normal range of motion.  Skin:    General: Skin is warm and dry.     Findings: No erythema or rash.  Neurological:     Mental Status: She is alert. Mental status is at baseline.     Comments: Alert, mumbles, incomprehensible Not following commands     ED Results / Procedures / Treatments   Labs (all labs ordered are listed, but only abnormal results are displayed) Labs Reviewed  URINALYSIS, ROUTINE W REFLEX MICROSCOPIC - Abnormal; Notable for the following components:      Result Value   APPearance HAZY (*)     Protein, ur 30 (*)    Leukocytes,Ua LARGE (*)    Bacteria, UA RARE (*)    All other components within normal limits  CBC WITH DIFFERENTIAL/PLATELET - Abnormal; Notable for the following components:   WBC 14.4 (*)    HCT 48.6 (*)    Neutro Abs 11.0 (*)    All other components within normal limits  COMPREHENSIVE METABOLIC PANEL - Abnormal; Notable for the following components:   Sodium 163 (*)    Potassium 5.3 (*)    Chloride 127 (*)    CO2 15 (*)    Glucose, Bld 106 (*)    BUN 110 (*)    Creatinine, Ser 5.47 (*)    GFR calc non Af Amer 6 (*)    GFR calc Af Amer 7 (*)    Anion gap 21 (*)    All other components within normal limits  D-DIMER, QUANTITATIVE (NOT AT [facility]) - Abnormal; Notable for the following components:   D-Dimer, Quant 6.26 (*)    All other components within normal limits  LACTATE DEHYDROGENASE - Abnormal; Notable for the following components:   LDH 286 (*)    All other components within normal limits  FERRITIN - Abnormal; Notable for the following components:   Ferritin 508 (*)    All other components within normal limits  TRIGLYCERIDES - Abnormal; Notable for the following components:   Triglycerides 294 (*)    All other components within normal limits  FIBRINOGEN - Abnormal; Notable for the following components:   Fibrinogen 610 (*)    All other components within normal limits  C-REACTIVE PROTEIN - Abnormal; Notable for the following components:   CRP 7.7 (*)    All other components within normal limits  BASIC METABOLIC PANEL - Abnormal; Notable for the following components:   Sodium 160 (*)    Chloride >130 (*)    CO2 15 (*)    Glucose, Bld 195 (*)    BUN 103 (*)    Creatinine, Ser 4.33 (*)    Calcium 8.0 (*)  GFR calc non Af Amer 8 (*)    GFR calc Af Amer 10 (*)    All other components within normal limits  CK - Abnormal; Notable for the following components:   Total CK 538 (*)    All other components within normal limits  CBC WITH  DIFFERENTIAL/PLATELET - Abnormal; Notable for the following components:   WBC 11.0 (*)    MCV 104.5 (*)    MCHC 29.5 (*)    Neutro Abs 8.1 (*)    All other components within normal limits  D-DIMER, QUANTITATIVE (NOT AT [facility]) - Abnormal; Notable for the following components:   D-Dimer, Quant 2.31 (*)    All other components within normal limits  C-REACTIVE PROTEIN - Abnormal; Notable for the following components:   CRP 6.5 (*)    All other components within normal limits  COMPREHENSIVE METABOLIC PANEL - Abnormal; Notable for the following components:   Sodium 162 (*)    Chloride >130 (*)    CO2 14 (*)    Glucose, Bld 103 (*)    BUN 96 (*)    Creatinine, Ser 3.64 (*)    Calcium 8.4 (*)    Total Protein 6.3 (*)    GFR calc non Af Amer 10 (*)    GFR calc Af Amer 12 (*)    All other components within normal limits  C-REACTIVE PROTEIN - Abnormal; Notable for the following components:   CRP 7.3 (*)    All other components within normal limits  FERRITIN - Abnormal; Notable for the following components:   Ferritin 425 (*)    All other components within normal limits  MAGNESIUM - Abnormal; Notable for the following components:   Magnesium 2.6 (*)    All other components within normal limits  POC SARS CORONAVIRUS 2 AG -  ED - Abnormal; Notable for the following components:   SARS Coronavirus 2 Ag POSITIVE (*)    All other components within normal limits  CULTURE, BLOOD (ROUTINE X 2)  CULTURE, BLOOD (ROUTINE X 2)  LACTIC ACID, PLASMA  LACTIC ACID, PLASMA  PROCALCITONIN  BASIC METABOLIC PANEL    EKG EKG Interpretation  Date/Time:  Tuesday October 04 2019 16:04:19 EST Ventricular Rate:  121 PR Interval:    QRS Duration: 78 QT Interval:  340 QTC Calculation: 483 R Axis:   -66 Text Interpretation: Sinus tachycardia Ventricular premature complex Aberrant conduction of SV complex(es) or artifact Abnormal R-wave progression, early transition Inferior infarct, old No significant  change since last tracing Confirmed by Gareth Morgan [phone]) on 10/04/2019 5:50:33 PM   Radiology DG Chest Port 1 View  Result Date: 10/04/2019 CLINICAL DATA:  Hypotension. Possible right arm DVT. Shortness of breath with hypoxia. COVID-19 infection. EXAM: PORTABLE CHEST 1 VIEW COMPARISON:  Radiographs 11/01/2018 and 08/05/2018. FINDINGS: 1533 hours. There is improved aeration of the lungs compared with the previous study with minimal residual right basilar atelectasis. No significant confluent or ground-glass pulmonary opacities. The heart size and mediastinal contours are stable. There is mild aortic atherosclerosis. No pleural effusion or pneumothorax. Telemetry leads overlie the chest. IMPRESSION: Improved aeration of the lungs with minimal residual right basilar atelectasis. No new findings. Electronically Signed   By: Richardean Sale M.D.   On: 10/04/2019 16:05   UE VENOUS DUPLEX ([facility] & [facility] 7 am - 7 pm)  Result Date: 10/04/2019 UPPER VENOUS STUDY  Indications: Swelling Other Indications: Covid positive. Comparison Study: No priors. Performing Technologist: Oda Cogan RDMS, RVT  Examination Guidelines: A  complete evaluation includes B-mode imaging, spectral Doppler, color Doppler, and power Doppler as needed of all accessible portions of each vessel. Bilateral testing is considered an integral part of a complete examination. Limited examinations for reoccurring indications may be performed as noted.  Right Findings: +----------+------------+---------+-----------+----------+-------+ RIGHT     CompressiblePhasicitySpontaneousPropertiesSummary +----------+------------+---------+-----------+----------+-------+ IJV           Full       Yes       Yes                      +----------+------------+---------+-----------+----------+-------+ Subclavian    Full       Yes       Yes                      +----------+------------+---------+-----------+----------+-------+ Axillary       Full       Yes       Yes                      +----------+------------+---------+-----------+----------+-------+ Brachial      Full       Yes       Yes                      +----------+------------+---------+-----------+----------+-------+ Radial        Full                                          +----------+------------+---------+-----------+----------+-------+ Ulnar         Full                                          +----------+------------+---------+-----------+----------+-------+ Cephalic      Full                                          +----------+------------+---------+-----------+----------+-------+ Basilic       Full                                          +----------+------------+---------+-----------+----------+-------+  Left Findings: +----------+------------+---------+-----------+----------+-------+ LEFT      CompressiblePhasicitySpontaneousPropertiesSummary +----------+------------+---------+-----------+----------+-------+ Subclavian               Yes       Yes              Patent  +----------+------------+---------+-----------+----------+-------+  Summary:  Right: No evidence of deep vein thrombosis in the upper extremity. No evidence of superficial vein thrombosis in the upper extremity.  Left: No evidence of thrombosis in the subclavian.  *See table(s) above for measurements and observations.  Diagnosing physician: Deitra Mayo MD Electronically signed by Deitra Mayo MD on 10/04/2019 at 7:58:52 PM.    Final     Procedures .Critical Care Performed by: Gareth Morgan, MD Authorized by: Gareth Morgan, MD   Critical care provider statement:    Critical care time (minutes):  45   Critical care was time spent personally by me on the following activities:  Evaluation of patient's response to treatment, examination of patient,  ordering and performing treatments and interventions, ordering and review of laboratory  studies, ordering and review of radiographic studies, pulse oximetry, re-evaluation of patient's condition, obtaining history from patient or surrogate and review of old charts Angiocath insertion  Date/Time: 10/05/2019 2:10 PM Performed by: Gareth Morgan, MD Authorized by: Gareth Morgan, MD  Consent: The procedure was performed in an emergent situation. Preparation: Patient was prepped and draped in the usual sterile fashion. Local anesthesia used: no  Anesthesia: Local anesthesia used: no  Sedation: Patient sedated: no  Patient tolerance: patient tolerated the procedure well with no immediate complications Comments: RUE IV     (including critical care time)  Medications Ordered in ED Medications  enoxaparin (LOVENOX) injection 40 mg (40 mg Subcutaneous Given 10/05/19 0135)  sodium chloride flush (NS) 0.9 % injection 3 mL (3 mLs Intravenous Given 10/04/19 2244)  guaiFENesin-dextromethorphan (ROBITUSSIN DM) 100-10 MG/5ML syrup 10 mL (has no administration in time range)  acetaminophen (TYLENOL) tablet 650 mg (has no administration in time range)  ondansetron (ZOFRAN) tablet 4 mg (has no administration in time range)    Or  ondansetron (ZOFRAN) injection 4 mg (has no administration in time range)  thiamine (B-1) injection 100 mg (100 mg Intravenous Given 10/05/19 0946)  dextrose 5 % solution ( Intravenous Stopped 10/05/19 1230)  levothyroxine (SYNTHROID, LEVOTHROID) injection 60 mcg (60 mcg Intravenous Given 10/05/19 1022)  Resource ThickenUp Clear (has no administration in time range)  sodium chloride flush (NS) 0.9 % injection 3 mL (3 mLs Intravenous Given 10/04/19 1945)  sodium chloride 0.9 % bolus 1,000 mL (0 mLs Intravenous Stopped 10/04/19 2000)    ED Course  I have reviewed the triage vital signs and the nursing notes.  Pertinent labs & imaging results that were available during my care of the patient were reviewed by me and considered in my medical decision  making (see chart for details).    MDM Rules/Calculators/A&P                       85yo female with history of CVA, dementia, hypertension, recent COVID19 diagnosis, presents from [facility] with concern for hypotension and possible RUE DVT.  On arrival to the ED, BP appear improved from what they had been as an outpatient 120/90, normal oxygenation on room air.   Given wish on MOST for for IV therapies and history of hypotension at facility, will order [place] pre-admission labs.  RUE with normal pulses, no sign of acute arterial occlusion or cellulitis. No sign of deformity to suggest fx, no hx of falls.  DVT study ordered.  Labs returned showing severe hypernatremia with Na of 163, K 5.3, Bicarb 15, Cr 5.47 with elevated BUN 110 and anion gap metabolic acidosis.   Suspect AKI and hypernatremia secondary to dehydration, decreased po intake in setting of COVID. Given IV fluid. Admitted to hospitalist for further care.      Final Clinical Impression(s) / ED Diagnoses Final diagnoses:  COVID-19  Localized swelling of right upper extremity  Dementia without behavioral disturbance, unspecified dementia type ([place])  Acute kidney injury ([place])  Hypernatremia  Hyperchloremia  Increased anion gap metabolic acidosis    Rx / DC Orders ED Discharge Orders    None       Gareth Morgan, MD 10/05/19 1412

## 2019-10-04 NOTE — ED Notes (Signed)
Vascular Tech at bedside.  

## 2019-10-04 NOTE — ED Triage Notes (Signed)
Pt arrives via EMS from [facility], COVID +, with MOST FORM, DNR, staff wanted patient evaluated for hypotension and possible DVT in right arm. Pt nonverbal from prev stroke. Bed bound. bp 70/50 initally hr 110. rr 22, temp 98. 95%RA. Most recent bp 97/64. 18g LAC. 250ml NS. Pulse present in right arm, with swelling.

## 2019-10-04 NOTE — ED Notes (Signed)
IV attempted x's 3 without success.  Unable to draw blood.

## 2019-10-05 DIAGNOSIS — U071 COVID-19: Secondary | ICD-10-CM

## 2019-10-05 LAB — COMPREHENSIVE METABOLIC PANEL
ALT: 14 U/L (ref 0–44)
ALT: 14 U/L (ref 0–44)
AST: 22 U/L (ref 15–41)
AST: 23 U/L (ref 15–41)
Albumin: 3.5 g/dL (ref 3.5–5.0)
Albumin: 3.5 g/dL (ref 3.5–5.0)
Alkaline Phosphatase: 68 U/L (ref 38–126)
Alkaline Phosphatase: 68 U/L (ref 38–126)
BUN: 96 mg/dL — ABNORMAL HIGH (ref 8–23)
BUN: 83 mg/dL — ABNORMAL HIGH (ref 8–23)
CO2: 14 mmol/L — ABNORMAL LOW (ref 22–32)
CO2: 15 mmol/L — ABNORMAL LOW (ref 22–32)
Calcium: 8.4 mg/dL — ABNORMAL LOW (ref 8.9–10.3)
Calcium: 8.9 mg/dL (ref 8.9–10.3)
Chloride: >130 mmol/L (ref 98–111)
Chloride: >130 mmol/L (ref 98–111)
Creatinine, Ser: 3.64 mg/dL — ABNORMAL HIGH (ref 0.44–1.00)
Creatinine, Ser: 2.88 mg/dL — ABNORMAL HIGH (ref 0.44–1.00)
GFR calc Af Amer: 12 mL/min — ABNORMAL LOW (ref >60)
GFR calc Af Amer: 16 mL/min — ABNORMAL LOW (ref >60)
GFR calc non Af Amer: 10 mL/min — ABNORMAL LOW (ref >60)
GFR calc non Af Amer: 14 mL/min — ABNORMAL LOW (ref >60)
Glucose, Bld: 103 mg/dL — ABNORMAL HIGH (ref 70–99)
Glucose, Bld: 103 mg/dL — ABNORMAL HIGH (ref 70–99)
Potassium: 4.6 mmol/L (ref 3.5–5.1)
Potassium: 4.5 mmol/L (ref 3.5–5.1)
Sodium: 162 mmol/L (ref 135–145)
Sodium: 163 mmol/L (ref 135–145)
Total Bilirubin: 0.6 mg/dL (ref 0.3–1.2)
Total Bilirubin: 0.5 mg/dL (ref 0.3–1.2)
Total Protein: 6.3 g/dL — ABNORMAL LOW (ref 6.5–8.1)
Total Protein: 7 g/dL (ref 6.5–8.1)

## 2019-10-05 LAB — CBC WITH DIFFERENTIAL/PLATELET
Abs Immature Granulocytes: 0.07 10*3/uL (ref 0.00–0.07)
Basophils Absolute: 0 10*3/uL (ref 0.0–0.1)
Basophils Relative: 0 %
Eosinophils Absolute: 0 10*3/uL (ref 0.0–0.5)
Eosinophils Relative: 0 %
HCT: 44 % (ref 36.0–46.0)
Hemoglobin: 13 g/dL (ref 12.0–15.0)
Immature Granulocytes: 1 %
Lymphocytes Relative: 18 %
Lymphs Abs: 2 10*3/uL (ref 0.7–4.0)
MCH: 30.9 pg (ref 26.0–34.0)
MCHC: 29.5 g/dL — ABNORMAL LOW (ref 30.0–36.0)
MCV: 104.5 fL — ABNORMAL HIGH (ref 80.0–100.0)
Monocytes Absolute: 0.9 10*3/uL (ref 0.1–1.0)
Monocytes Relative: 8 %
Neutro Abs: 8.1 10*3/uL — ABNORMAL HIGH (ref 1.7–7.7)
Neutrophils Relative %: 73 %
Platelets: 189 10*3/uL (ref 150–400)
RBC: 4.21 MIL/uL (ref 3.87–5.11)
RDW: 15.4 % (ref 11.5–15.5)
WBC: 11 10*3/uL — ABNORMAL HIGH (ref 4.0–10.5)
nRBC: 0 % (ref 0.0–0.2)

## 2019-10-05 LAB — D-DIMER, QUANTITATIVE: D-Dimer, Quant: 2.31 ug/mL-FEU — ABNORMAL HIGH (ref 0.00–0.50)

## 2019-10-05 LAB — BASIC METABOLIC PANEL
BUN: 103 mg/dL — ABNORMAL HIGH (ref 8–23)
CO2: 15 mmol/L — ABNORMAL LOW (ref 22–32)
Calcium: 8 mg/dL — ABNORMAL LOW (ref 8.9–10.3)
Chloride: >130 mmol/L (ref 98–111)
Creatinine, Ser: 4.33 mg/dL — ABNORMAL HIGH (ref 0.44–1.00)
GFR calc Af Amer: 10 mL/min — ABNORMAL LOW (ref >60)
GFR calc non Af Amer: 8 mL/min — ABNORMAL LOW (ref >60)
Glucose, Bld: 195 mg/dL — ABNORMAL HIGH (ref 70–99)
Potassium: 4.1 mmol/L (ref 3.5–5.1)
Sodium: 160 mmol/L — ABNORMAL HIGH (ref 135–145)

## 2019-10-05 LAB — LACTIC ACID, PLASMA: Lactic Acid, Venous: 1.9 mmol/L (ref 0.5–1.9)

## 2019-10-05 LAB — MAGNESIUM: Magnesium: 2.6 mg/dL — ABNORMAL HIGH (ref 1.7–2.4)

## 2019-10-05 LAB — C-REACTIVE PROTEIN
CRP: 6.5 mg/dL — ABNORMAL HIGH (ref <1.0)
CRP: 7.3 mg/dL — ABNORMAL HIGH (ref <1.0)

## 2019-10-05 LAB — CK: Total CK: 538 U/L — ABNORMAL HIGH (ref 38–234)

## 2019-10-05 LAB — PROCALCITONIN: Procalcitonin: <0.1 ng/mL

## 2019-10-05 LAB — FERRITIN: Ferritin: 425 ng/mL — ABNORMAL HIGH (ref 11–307)

## 2019-10-05 LAB — GLUCOSE, CAPILLARY: Glucose-Capillary: 83 mg/dL (ref 70–99)

## 2019-10-05 MED ORDER — RESOURCE THICKENUP CLEAR PO POWD
ORAL | Status: DC | PRN
  Filled 2019-10-05: qty 125

## 2019-10-05 MED ORDER — DEXTROSE 5 % IV SOLN: INTRAVENOUS | Status: DC

## 2019-10-05 MED ORDER — ACETAMINOPHEN 650 MG RE SUPP
650.0000 mg | Freq: Four times a day (QID) | RECTAL | Status: DC | PRN
  Administered 2019-10-05 – 2019-10-06 (×3): 650 mg via RECTAL
  Filled 2019-10-05 (×3): qty 1

## 2019-10-05 MED ORDER — LEVOTHYROXINE SODIUM 100 MCG/5ML IV SOLN
60.0000 ug | Freq: Every day | INTRAVENOUS | Status: DC
  Administered 2019-10-05 – 2019-10-08 (×4): 60 ug via INTRAVENOUS
  Filled 2019-10-05 (×4): qty 5

## 2019-10-05 MED ORDER — HEPARIN SODIUM (PORCINE) 5000 UNIT/ML IJ SOLN
5000.0000 [IU] | Freq: Three times a day (TID) | INTRAMUSCULAR | Status: DC
  Administered 2019-10-05 – 2019-10-09 (×11): 5000 [IU] via SUBCUTANEOUS
  Filled 2019-10-05 (×10): qty 1

## 2019-10-05 MED ORDER — LACTATED RINGERS IV BOLUS: 500.0000 mL | Freq: Once | INTRAVENOUS | Status: DC

## 2019-10-05 NOTE — ED Notes (Signed)
Lunch tray ordered 

## 2019-10-05 NOTE — ED Notes (Signed)
Paged Dr. Baltazar Najjar x2 about pt's chloride reading greater than 130

## 2019-10-05 NOTE — Progress Notes (Addendum)
[name] [id number] Admission Data: 10/05/2019 8:14 PM Attending Provider: Nita Sells, MD  [id number], [facility] Consults/ Treatment Team:   Cathy Johnson is a 85 y.o. female patient admitted from ED  DNR, VSS - Blood pressure (!) 83/63, pulse (!) 118, temperature 97.9 F (36.6 C), temperature source Rectal, resp. rate (!) 21, SpO2 100 %., O2 6L nasal cannula, shallow breathing, no c/o chest pain. Tele # [id number] placed and pt is currently running:sinus tachycardia.   IV site WDL:  upper arm left, condition patent and no redness with a transparent dsg that's clean dry and intact.  Allergies:   Allergies  Allergen Reactions  . Boniva [Ibandronic Acid] Other (See Comments)    "Allergic," per MAR  . Crestor [Rosuvastatin Calcium] Other (See Comments)    "Allergic," per MAR  . Exelon [Rivastigmine Tartrate] Other (See Comments)    "Allergic," per MAR  . Fosamax [Alendronate Sodium] Other (See Comments)    "Allergic," per MAR  . Galantamine Other (See Comments)    "Allergic," per MAR  . Lipitor [Atorvastatin] Other (See Comments)    "Allergic," per MAR  . Morphine And Related Other (See Comments)    "Allergic," per MAR  . Other Other (See Comments)    "Sedatives" and "Multiple, unknown B/P meds" = "Allergic," per MAR  . Vagifem [Estradiol] Other (See Comments)    "Allergic," per [facility]     Past Medical History:  Diagnosis Date  . Arthritis   . Dementia ([place])   . Hypertension   . Osteoarthritis of right knee 05/02/2014  . PFO (patent foramen ovale)    PFO by bubble study 02/05/11 TEE  . Stroke [facility])    Admission INP armband ID verified with patient/family, and in place. Skin, clean-dry- intact without evidence of bruising, or skin tears.   No evidence of skin break down noted on exam. no rashes, no ecchymoses, no wounds   Will cont to monitor and assist as needed.  Walker Shadow, RN 10/05/2019 8:14 PM

## 2019-10-05 NOTE — ED Notes (Signed)
Ordered breakfast--Cathy Johnson 

## 2019-10-05 NOTE — ED Notes (Signed)
Phlebotomy to get AM labs d/t pt being a difficult stick.

## 2019-10-05 NOTE — Progress Notes (Signed)
Hospitalist daily note   Cathy Johnson [id number] DOB: 1928-07-13 DOA: 10/04/2019  PCP: Orvis Brill, [facility]   Narrative:  85 year old white female dementia hypothyroid depression prior CVA's dating back to in starting 02/01/2011 09/17/2018 + 08/05/2018, + TIA 07/22/2018, 03/06/2017--(has a history of PFO) prior orthostatic hypotension-sent from [facility], diagnosed Covid 09/23/2019 On arrival hypotensive 70/50 heart rate 110  Data Reviewed:  Sodium 163-->160 chloride 127-->130 CO2 15  BUN/creatinine 110/5.4-->103/4.3 CK 538  LDH 286 Ferritin 508 CRP 7.7-->6.5 D-dimer 6.2-->2.3 Lactic acid 1.7-->1.9 White count 14.4-->11.0 Portable chest x-ray improved aeration lungs minimal residual right basilar atelectasis no new findings Assessment & Plan: Sepsis secondary to coronavirus 19 Hypotension  Resolving-cont IVF, check labs am--no role Abx No oxygen req--monitor carefully--inflamm markers down trending and rpt in am--holding at this time remdesivir not a candidate currently for Actemra either Cont robituassin--try to prone AKI severe hypernatremia metabolic acidosis Likely from ARB/ATN from dual ARB ace Change fluids to only D5 100/h Labs q12 At least 5 CVAs with multi-infarct dementia and prior PFO hold Plavix 75, aspirin 81-rectal aspirin as tolerated in the next day or so if not improvwed HTN At this time holding lisinopril 40 Micardis 80 clonidine 0.3 twice daily Hypothyroid Replace with half of his usual home dose give 60 mcg daily IV as not taking PO Severe dementia Patient not eating not drinking May need to discuss this with family if does not improve  Lovenox, DNR MOST form filled,  Inpatient   Subjective: Awake but cannot really orient to place person time Nursing states she has been like this Consultants:    Procedures:    Antimicrobials:       Objective: Vitals:   10/05/19 0602 10/05/19 0615 10/05/19 0630 10/05/19 0645  BP: (!) 143/104      Pulse:  94 (!) 109 (!) 104  Resp:  19 (!) 22 19  Temp:      TempSrc:      SpO2:  96% 100% 99%    Intake/Output Summary (Last 24 hours) at 10/05/2019 [id number] Last data filed at 10/04/2019 2000 Gross per 24 hour  Intake 1000 ml  Output --  Net 1000 ml   There were no vitals filed for this visit.  Examination: Awake frail neck soft supple EOMI NCAT S1-S2 no murmur Chest is clinically clear no added sound Abdomen soft nontender no rebound no guarding  neurologically intact with no focal deficit to cursory exam No decubiti  Scheduled Meds: . enoxaparin (LOVENOX) injection  40 mg Subcutaneous QHS  . sodium chloride flush  3 mL Intravenous Q12H  . thiamine injection  100 mg Intravenous Daily   Continuous Infusions: . dextrose 5 % and 0.45% NaCl Stopped (10/05/19 0500)     LOS: 1 day   Time spent: [place], MD [facility]

## 2019-10-05 NOTE — ED Notes (Signed)
Date and time results received: 10/05/19 0230  Test: chloride Critical Value: greater than 130  Name of Provider Notified: Baltazar Najjar  Orders Received? Or Actions Taken?: paged waiting on answer

## 2019-10-05 NOTE — ED Notes (Signed)
Pt's tongue and liips are very dry, blackened coating across tongue-- used green swab to perform oral hygiene, pt became more agitated. Will continue with limited time at each attempt.  IV infiltrated in right upper arm-- attempt x 1 for restart- has multiple bruises from attempts yesterday. Will consult IV therapy.

## 2019-10-05 NOTE — Progress Notes (Signed)
Received report from [place], [place] in ED. Mendel Ryder said pt is a difficult stick and when Iv team started iv they drew blood but it clotted, 5pm bmp.  Will call house phlebotomy when pt arrives to the floor to try to redraw bmp.

## 2019-10-05 NOTE — ED Notes (Signed)
This RN attempted to feed pt breakfast, pt refused.

## 2019-10-05 NOTE — ED Notes (Signed)
Admitting provider at bedside.

## 2019-10-05 NOTE — ED Notes (Signed)
IV infiltrated in right upper arm, arm is swollen, red-- warm compresses applied

## 2019-10-06 ENCOUNTER — Other Ambulatory Visit: Payer: Self-pay

## 2019-10-06 LAB — CBC WITH DIFFERENTIAL/PLATELET
Abs Immature Granulocytes: 0.07 10*3/uL (ref 0.00–0.07)
Basophils Absolute: 0 10*3/uL (ref 0.0–0.1)
Basophils Relative: 0 %
Eosinophils Absolute: 0.1 10*3/uL (ref 0.0–0.5)
Eosinophils Relative: 1 %
HCT: 41.2 % (ref 36.0–46.0)
Hemoglobin: 12.5 g/dL (ref 12.0–15.0)
Immature Granulocytes: 1 %
Lymphocytes Relative: 22 %
Lymphs Abs: 2.1 10*3/uL (ref 0.7–4.0)
MCH: 30.6 pg (ref 26.0–34.0)
MCHC: 30.3 g/dL (ref 30.0–36.0)
MCV: 100.7 fL — ABNORMAL HIGH (ref 80.0–100.0)
Monocytes Absolute: 0.6 10*3/uL (ref 0.1–1.0)
Monocytes Relative: 7 %
Neutro Abs: 6.5 10*3/uL (ref 1.7–7.7)
Neutrophils Relative %: 69 %
Platelets: 193 10*3/uL (ref 150–400)
RBC: 4.09 MIL/uL (ref 3.87–5.11)
RDW: 15.3 % (ref 11.5–15.5)
WBC: 9.3 10*3/uL (ref 4.0–10.5)
nRBC: 0 % (ref 0.0–0.2)

## 2019-10-06 LAB — COMPREHENSIVE METABOLIC PANEL
ALT: 15 U/L (ref 0–44)
AST: 24 U/L (ref 15–41)
Albumin: 3.3 g/dL — ABNORMAL LOW (ref 3.5–5.0)
Alkaline Phosphatase: 66 U/L (ref 38–126)
Anion gap: 10 (ref 5–15)
BUN: 71 mg/dL — ABNORMAL HIGH (ref 8–23)
CO2: 18 mmol/L — ABNORMAL LOW (ref 22–32)
Calcium: 8.7 mg/dL — ABNORMAL LOW (ref 8.9–10.3)
Chloride: 128 mmol/L — ABNORMAL HIGH (ref 98–111)
Creatinine, Ser: 2.24 mg/dL — ABNORMAL HIGH (ref 0.44–1.00)
GFR calc Af Amer: 22 mL/min — ABNORMAL LOW (ref >60)
GFR calc non Af Amer: 19 mL/min — ABNORMAL LOW (ref >60)
Glucose, Bld: 128 mg/dL — ABNORMAL HIGH (ref 70–99)
Potassium: 3.8 mmol/L (ref 3.5–5.1)
Sodium: 156 mmol/L — ABNORMAL HIGH (ref 135–145)
Total Bilirubin: 0.6 mg/dL (ref 0.3–1.2)
Total Protein: 6.2 g/dL — ABNORMAL LOW (ref 6.5–8.1)

## 2019-10-06 LAB — BASIC METABOLIC PANEL
Anion gap: 9 (ref 5–15)
BUN: 60 mg/dL — ABNORMAL HIGH (ref 8–23)
CO2: 15 mmol/L — ABNORMAL LOW (ref 22–32)
Calcium: 8.3 mg/dL — ABNORMAL LOW (ref 8.9–10.3)
Chloride: 128 mmol/L — ABNORMAL HIGH (ref 98–111)
Creatinine, Ser: 1.83 mg/dL — ABNORMAL HIGH (ref 0.44–1.00)
GFR calc Af Amer: 28 mL/min — ABNORMAL LOW (ref >60)
GFR calc non Af Amer: 24 mL/min — ABNORMAL LOW (ref >60)
Glucose, Bld: 116 mg/dL — ABNORMAL HIGH (ref 70–99)
Potassium: 4 mmol/L (ref 3.5–5.1)
Sodium: 152 mmol/L — ABNORMAL HIGH (ref 135–145)

## 2019-10-06 LAB — MAGNESIUM: Magnesium: 2.5 mg/dL — ABNORMAL HIGH (ref 1.7–2.4)

## 2019-10-06 LAB — FERRITIN: Ferritin: 405 ng/mL — ABNORMAL HIGH (ref 11–307)

## 2019-10-06 LAB — D-DIMER, QUANTITATIVE: D-Dimer, Quant: 2.02 ug/mL-FEU — ABNORMAL HIGH (ref 0.00–0.50)

## 2019-10-06 LAB — MRSA PCR SCREENING: MRSA by PCR: NEGATIVE

## 2019-10-06 LAB — C-REACTIVE PROTEIN: CRP: 7.9 mg/dL — ABNORMAL HIGH (ref <1.0)

## 2019-10-06 MED ORDER — DEXAMETHASONE SODIUM PHOSPHATE 10 MG/ML IJ SOLN
6.0000 mg | INTRAMUSCULAR | Status: DC
  Administered 2019-10-06 – 2019-10-08 (×3): 6 mg via INTRAVENOUS
  Filled 2019-10-06 (×3): qty 1

## 2019-10-06 MED ORDER — LORAZEPAM 2 MG/ML IJ SOLN
0.5000 mg | INTRAMUSCULAR | Status: DC | PRN
  Filled 2019-10-06: qty 1

## 2019-10-06 MED ORDER — CLOPIDOGREL BISULFATE 75 MG PO TABS
75.0000 mg | ORAL_TABLET | Freq: Every day | ORAL | Status: DC
  Administered 2019-10-07: 75 mg via ORAL
  Filled 2019-10-06: qty 1

## 2019-10-06 MED ORDER — ENSURE ENLIVE PO LIQD
237.0000 mL | Freq: Three times a day (TID) | ORAL | Status: DC
  Administered 2019-10-07 – 2019-10-08 (×4): 237 mL via ORAL

## 2019-10-06 MED ORDER — RISPERIDONE 0.5 MG PO TABS
0.5000 mg | ORAL_TABLET | Freq: Two times a day (BID) | ORAL | Status: DC
  Administered 2019-10-07 (×2): 0.5 mg via ORAL
  Filled 2019-10-06 (×3): qty 1

## 2019-10-06 MED ORDER — METOPROLOL TARTRATE 5 MG/5ML IV SOLN: 5.0000 mg | Freq: Four times a day (QID) | INTRAVENOUS | Status: DC

## 2019-10-06 MED ORDER — CLONIDINE HCL 0.2 MG PO TABS
0.2000 mg | ORAL_TABLET | Freq: Two times a day (BID) | ORAL | Status: DC
  Filled 2019-10-06: qty 1

## 2019-10-06 MED ORDER — AMLODIPINE BESYLATE 2.5 MG PO TABS
2.5000 mg | ORAL_TABLET | Freq: Every day | ORAL | Status: DC
  Administered 2019-10-07: 2.5 mg via ORAL
  Filled 2019-10-06: qty 1

## 2019-10-06 MED ORDER — ASPIRIN 81 MG PO CHEW
81.0000 mg | CHEWABLE_TABLET | Freq: Every day | ORAL | Status: DC
  Administered 2019-10-07: 81 mg via ORAL
  Filled 2019-10-06: qty 1

## 2019-10-06 MED ORDER — METOPROLOL TARTRATE 5 MG/5ML IV SOLN
5.0000 mg | Freq: Four times a day (QID) | INTRAVENOUS | Status: DC
  Administered 2019-10-06 – 2019-10-08 (×2): 5 mg via INTRAVENOUS
  Filled 2019-10-06 (×5): qty 5

## 2019-10-06 NOTE — Progress Notes (Signed)
SLP Cancellation Note  Patient Details Name: Cathy Johnson MRN: [id number] DOB: 1928/05/06   Cancelled treatment:       Reason Eval/Treat Not Completed: Fatigue/lethargy limiting ability to participate. Pt unable to adequate wake with sternal rub and verbal stimuli. She moans but falls back asleep. RN present. Will check on pt tomorrow (via RN) for appropriateness for swallow assessment.    Houston Siren 10/06/2019, 2:38 PM Orbie Pyo Colvin Caroli.Ed Risk analyst [phone] Office [phone]

## 2019-10-06 NOTE — Evaluation (Signed)
Physical Therapy Evaluation Patient Details Name: Cathy Johnson MRN: [id number] DOB: [birth year] Today's Date: 10/06/2019   History of Present Illness  85 year old female dementia hypothyroid depression prior CVA's dating back to in starting 02/01/2011 09/17/2018 + 08/05/2018, + TIA 07/22/2018, 03/06/2017--(has a history of PFO) prior orthostatic hypotension-sent from [facility], diagnosed Covid 09/23/2019. Admitted 10/04/2019 for treatment of sepsis related to COVID  Clinical Impression  Per chart prior to admission pt was bed bound and dementia limited her ability to follow commands. Tried to engage pt in answering questions. Pt occasionally mumbled unintelligible response. In preparation to perform bed mobility pt found to be incontinent of stool. Pt requires total A for rolling L and R for cleaning. Pt unable to follow any commands while being cleaned up. Pt appears to be at baseline level of function and with inability to follow commands would not be able to participate in therapy. PT is signing off.    Follow Up Recommendations SNF;No PT follow up(return to [facility])          Precautions / Restrictions Precautions Precautions: Fall Restrictions Weight Bearing Restrictions: No      Mobility  Bed Mobility Overal bed mobility: Needs Assistance Bed Mobility: Rolling Rolling: Total assist;+2 for physical assistance         General bed mobility comments: Pt found incontinent of stool on entry, assisted NT with rolling for cleaning, Pt requires total A for rolling L and R              Pertinent Vitals/Pain Pain Assessment: Faces Faces Pain Scale: Hurts whole lot Pain Location: generalized with turning however may be due to fear  Pain Descriptors / Indicators: Grimacing;Moaning Pain Intervention(s): Limited activity within patient's tolerance;Monitored during session;Repositioned    Home Living Family/patient expects to be discharged to:: Skilled nursing facility                  Additional Comments: From [employer]    Prior Function           Comments: Per note bedbound at baseline        Extremity/Trunk Assessment   Upper Extremity Assessment Upper Extremity Assessment: Difficult to assess due to impaired cognition    Lower Extremity Assessment Lower Extremity Assessment: Difficult to assess due to impaired cognition       Communication   Communication: Expressive difficulties(mumbling response to questions, unintellegible )  Cognition Arousal/Alertness: Awake/alert Behavior During Therapy: Flat affect;Anxious Overall Cognitive Status: History of cognitive impairments - at baseline                                 General Comments: unable to follow any commands      General Comments General comments (skin integrity, edema, etc.): HR with turning max 130 bpm, on 2L O2 via [place] maintained SaO2 >90%O2 throughout session         Assessment/Plan    PT Assessment Patent does not need any further PT services  PT Problem List Decreased strength;Decreased activity tolerance;Decreased balance;Decreased mobility;Decreased cognition;Decreased range of motion;Decreased knowledge of precautions       PT Treatment Interventions      PT Goals (Current goals can be found in the Care Plan section)  Acute Rehab PT Goals PT Goal Formulation: Patient unable to participate in goal setting     AM-PAC PT "6 Clicks" Mobility  Outcome Measure Help needed turning from your back to your  side while in a flat bed without using bedrails?: Total Help needed moving from lying on your back to sitting on the side of a flat bed without using bedrails?: Total Help needed moving to and from a bed to a chair (including a wheelchair)?: Total Help needed standing up from a chair using your arms (e.g., wheelchair or bedside chair)?: Total Help needed to walk in hospital room?: Total Help needed climbing 3-5 steps with a railing? :  Total 6 Click Score: 6    End of Session   Activity Tolerance: Patient tolerated treatment well Patient left: in bed;with call bell/phone within reach;with bed alarm set Nurse Communication: Mobility status PT Visit Diagnosis: Muscle weakness (generalized) (M62.81)    Time: 0930-1001 PT Time Calculation (min) (ACUTE ONLY): 31 min   Charges:     PT Treatments $Therapeutic Activity: 8-22 mins        Cathy Johnson B. Migdalia Dk PT, DPT Acute Rehabilitation Services Pager [phone] Office [phone]   [place] 10/06/2019, 1:21 PM

## 2019-10-06 NOTE — Progress Notes (Addendum)
Notified Dr. Verlon Au of HR 119 &  NPO (failed swallow eval).    Dr. Verlon Au has asked nursing to not give Ativan again.

## 2019-10-06 NOTE — Progress Notes (Signed)
Initial Nutrition Assessment  RD working remotely.  DOCUMENTATION CODES:   Not applicable  INTERVENTION:   - RD will follow for GOC  - Ensure Enlive po TID, each supplement provides 350 kcal and 20 grams of protein  - Recommend obtaining admission weight  NUTRITION DIAGNOSIS:   Increased nutrient needs related to acute illness (COVID-19) as evidenced by estimated needs.  GOAL:   Patient will meet greater than or equal to 90% of their needs  MONITOR:   PO intake, Supplement acceptance, Labs, Weight trends  REASON FOR ASSESSMENT:   Low Braden    ASSESSMENT:   85 year old female who presented to the ED on 12/15 with COVID+ status and hypotension. Pt is nonverbal from previous stroke and bedbound at baseline. PMH of CVA, dementia, HTN.   Per ED Provider note, pt's appetite has been great up until diagnosis of COVID-19. Per H&P, "avoid all aggressive interventions."  Per RN note 12/16, pt with "blackened coating across tongue."  Pt on a Dysphagia 3 diet with no PO intake at meals documented. Reviewed SLP note: "pt unable to adequate wake with sternal rub and verbal stimuli. She moans but falls back asleep. RN present. Will check on pt tomorrow (via RN) for appropriateness for swallow assessment."  RD will order oral nutrition supplements to aid pt in meeting kcal and protein needs during admission. Will follow for SLP recommendations.  Reviewed weight history in chart. Pt with a slow decline in weight since January 2020. However, no new weights available since 03/22/19.  Per RN edema assessment, pt with mild pitting edema to BUE.  Medications reviewed and include: thiamine IVF: D5 @ 100 ml/hr  Labs reviewed: sodium 156, chloride 128, BUN 71, creatinine 2.24, magnesium 2.5 CBG's: 83  NUTRITION - FOCUSED PHYSICAL EXAM:  Unable to complete at this time. RD working remotely.  Diet Order:   Diet Order            DIET DYS 3 Room service appropriate? Yes; Fluid  consistency: Thin  Diet effective now              EDUCATION NEEDS:   No education needs have been identified at this time  Skin:  Skin Assessment: Reviewed RN Assessment  Last BM:  10/06/19 large type 6  Height:   Ht Readings from Last 1 Encounters:  11/10/18 5\' 4"  (1.626 m)    Weight:   Wt Readings from Last 1 Encounters:  03/22/19 69.4 kg    Ideal Body Weight:  54.5 kg (calculated using height from 11/10/18)  BMI:  26.29 kg/m^2  Estimated Nutritional Needs:   Kcal:  1500-1700  Protein:  70-85 grams  Fluid:  >/= 1.5 L    Gaynell Face, MS, RD, LDN Inpatient Clinical Dietitian Pager: [phone] Weekend/After Hours: [phone]

## 2019-10-06 NOTE — Progress Notes (Addendum)
Hospitalist daily note   Cathy Johnson [id number] DOB: Oct 25, 1927 DOA: 10/04/2019  PCP: Orvis Brill, [facility]   Narrative:  85 year old white female dementia hypothyroid depression prior CVA's dating back to in starting 02/01/2011 09/17/2018 + 08/05/2018, + TIA 07/22/2018, 03/06/2017--(has a history of PFO) prior orthostatic hypotension-sent from [facility], diagnosed Covid 09/23/2019 On arrival hypotensive 70/50 heart rate 110  Data Reviewed:  Sodium 163-->160-->156 chloride 127-->130-->128 CO2 15  BUN/creatinine 110/5.4-->103/4.3-->71/2.24 CK 538 -- LDH 286 Ferritin 508-405 CRP 7.7-->6.5->7.9 D-dimer 6.2-->2.3->2.02 Lactic acid 1.7-->1.9 White count 14.4-->11.0-->9.3 Portable chest x-ray improved aeration lungs minimal residual right basilar atelectasis no new findings Assessment & Plan: Sepsis secondary to coronavirus 19 Hypotension Resolving-cont IVF, check labs am--no role Abx inflamm markers down trending and rpt in am Starting Decadron 6 IV daily given mild oxygen req Holding Remdisivir/Actrema at this time Cont robitussin--try to prone Using oxygen 2 liters AKI severe hypernatremia metabolic acidosis Likely from ARB/ATN from dual ARB ace Change fluids to only D5 100/h--labs improving At least 5 CVAs with multi-infarct dementia and prior PFO hold Plavix 75, aspirin 81-rectal aspirin as tolerated in the next day or so if not improvwed HTN At this time holding lisinopril 40 Micardis 80 clonidine 0.3 twice daily--monitor tredns Hypothyroid Replace with half of his usual home dose give 60 mcg daily IV as not taking PO Severe dementia Patient not eating not drinking May need to discuss this with family if does not improve  Lovenox, DNR MOST form filled,  Inpatient Called son Cathy Johnson --and updated and reviewed   Subjective:  Sleepy and incoherent this am On oxygen  Cannot get ROS from patient  Objective: Vitals:   10/05/19 1900 10/05/19 2000 10/05/19 2205  10/06/19 1028  BP: (!) 83/63 (!) 149/117 115/82 (!) 148/117  Pulse: (!) 118 (!) 114 (!) 116 (!) 123  Resp: (!) 21 18 17 18   Temp: 97.9 F (36.6 C)   98.6 F (37 C)  TempSrc: Rectal   Axillary  SpO2: 100% 100% 99%     Intake/Output Summary (Last 24 hours) at 10/06/2019 1050 Last data filed at 10/05/2019 1857 Gross per 24 hour  Intake 272.41 ml  Output 500 ml  Net -227.59 ml   There were no vitals filed for this visit.  Examination: frail neck soft supple EOMI NCAT S1-S2 no murmur Chest is clinically clear  Abdomen soft- no rebound no guarding  neurologically intact with no focal deficit to cursory exam No decubiti  Scheduled Meds: . heparin injection (subcutaneous)  5,000 Units Subcutaneous Q8H  . levothyroxine  60 mcg Intravenous Daily  . sodium chloride flush  3 mL Intravenous Q12H  . thiamine injection  100 mg Intravenous Daily   Continuous Infusions: . dextrose 100 mL/hr at 10/06/19 0635     LOS: 2 days   Time spent: [place], MD [facility]

## 2019-10-07 LAB — C-REACTIVE PROTEIN: CRP: 5.3 mg/dL — ABNORMAL HIGH (ref <1.0)

## 2019-10-07 LAB — COMPREHENSIVE METABOLIC PANEL
ALT: 13 U/L (ref 0–44)
AST: 17 U/L (ref 15–41)
Albumin: 2.7 g/dL — ABNORMAL LOW (ref 3.5–5.0)
Alkaline Phosphatase: 60 U/L (ref 38–126)
Anion gap: 8 (ref 5–15)
BUN: 47 mg/dL — ABNORMAL HIGH (ref 8–23)
CO2: 19 mmol/L — ABNORMAL LOW (ref 22–32)
Calcium: 8.3 mg/dL — ABNORMAL LOW (ref 8.9–10.3)
Chloride: 120 mmol/L — ABNORMAL HIGH (ref 98–111)
Creatinine, Ser: 1.58 mg/dL — ABNORMAL HIGH (ref 0.44–1.00)
GFR calc Af Amer: 33 mL/min — ABNORMAL LOW (ref >60)
GFR calc non Af Amer: 29 mL/min — ABNORMAL LOW (ref >60)
Glucose, Bld: 147 mg/dL — ABNORMAL HIGH (ref 70–99)
Potassium: 3.9 mmol/L (ref 3.5–5.1)
Sodium: 147 mmol/L — ABNORMAL HIGH (ref 135–145)
Total Bilirubin: 0.6 mg/dL (ref 0.3–1.2)
Total Protein: 5.4 g/dL — ABNORMAL LOW (ref 6.5–8.1)

## 2019-10-07 LAB — CBC WITH DIFFERENTIAL/PLATELET
Abs Immature Granulocytes: 0.06 10*3/uL (ref 0.00–0.07)
Basophils Absolute: 0 10*3/uL (ref 0.0–0.1)
Basophils Relative: 0 %
Eosinophils Absolute: 0 10*3/uL (ref 0.0–0.5)
Eosinophils Relative: 0 %
HCT: 34.8 % — ABNORMAL LOW (ref 36.0–46.0)
Hemoglobin: 10.6 g/dL — ABNORMAL LOW (ref 12.0–15.0)
Immature Granulocytes: 1 %
Lymphocytes Relative: 18 %
Lymphs Abs: 1.1 10*3/uL (ref 0.7–4.0)
MCH: 30.5 pg (ref 26.0–34.0)
MCHC: 30.5 g/dL (ref 30.0–36.0)
MCV: 100 fL (ref 80.0–100.0)
Monocytes Absolute: 0.3 10*3/uL (ref 0.1–1.0)
Monocytes Relative: 4 %
Neutro Abs: 4.8 10*3/uL (ref 1.7–7.7)
Neutrophils Relative %: 77 %
Platelets: 149 10*3/uL — ABNORMAL LOW (ref 150–400)
RBC: 3.48 MIL/uL — ABNORMAL LOW (ref 3.87–5.11)
RDW: 14.3 % (ref 11.5–15.5)
WBC: 6.3 10*3/uL (ref 4.0–10.5)
nRBC: 0 % (ref 0.0–0.2)

## 2019-10-07 LAB — BASIC METABOLIC PANEL
Anion gap: 10 (ref 5–15)
BUN: 39 mg/dL — ABNORMAL HIGH (ref 8–23)
CO2: 18 mmol/L — ABNORMAL LOW (ref 22–32)
Calcium: 8.6 mg/dL — ABNORMAL LOW (ref 8.9–10.3)
Chloride: 116 mmol/L — ABNORMAL HIGH (ref 98–111)
Creatinine, Ser: 1.42 mg/dL — ABNORMAL HIGH (ref 0.44–1.00)
GFR calc Af Amer: 38 mL/min — ABNORMAL LOW (ref >60)
GFR calc non Af Amer: 33 mL/min — ABNORMAL LOW (ref >60)
Glucose, Bld: 112 mg/dL — ABNORMAL HIGH (ref 70–99)
Potassium: 3.3 mmol/L — ABNORMAL LOW (ref 3.5–5.1)
Sodium: 144 mmol/L (ref 135–145)

## 2019-10-07 LAB — FERRITIN: Ferritin: 312 ng/mL — ABNORMAL HIGH (ref 11–307)

## 2019-10-07 LAB — MAGNESIUM: Magnesium: 2 mg/dL (ref 1.7–2.4)

## 2019-10-07 LAB — D-DIMER, QUANTITATIVE: D-Dimer, Quant: 1.75 ug/mL-FEU — ABNORMAL HIGH (ref 0.00–0.50)

## 2019-10-07 NOTE — NC FL2 (Signed)
[place] LEVEL OF CARE SCREENING TOOL     IDENTIFICATION  Patient Name: Cathy Johnson Birthdate: Jun 08, 1928 Sex: female Admission Date (Current Location): 10/04/2019  [facility] and [place] Number:  Herbalist and Address:  [facility] [place], [place] [address] [place], [place] [address]      Provider Number: [id number]  Attending Physician Name and Address:  Nita Sells, MD  Relative Name and Phone Number:  [name], son, [phone]    Current Level of Care: Hospital Recommended Level of Care: [place] Prior Approval Number:    Date Approved/Denied:   PASRR Number: [id number]  Discharge Plan: SNF    Current Diagnoses: Patient Active Problem List   Diagnosis Date Noted  . COVID-19 10/05/2019  . COVID-19 virus infection 10/04/2019  . Hypernatremia 10/04/2019  . Dehydration 10/04/2019  . Acute encephalopathy 11/01/2018  . CVA (cerebral vascular accident) ([place]) 09/18/2018  . Elevated alkaline phosphatase level 08/06/2018  . Hypothyroidism 08/06/2018  . AMS (altered mental status) 08/05/2018  . Acute CVA (cerebrovascular accident) ([place]) 08/05/2018  . Altered mental status 07/21/2018  . Facial droop 03/06/2017  . AKI (acute kidney injury) ([place]) 01/06/2016  . Syncope 01/06/2016  . Dementia ([place]) 01/06/2016  . Hypertension 01/06/2016  . Anemia 01/06/2016  . Osteoarthritis of right knee 05/02/2014  . Knee osteoarthritis 05/02/2014    Orientation RESPIRATION BLADDER Height & Weight     (Disoriented x4)  O2(Nasal cannula 1L) Incontinent, External catheter Weight:   Height:     BEHAVIORAL SYMPTOMS/MOOD NEUROLOGICAL BOWEL NUTRITION STATUS      Incontinent Diet(Please see DC Summary)  AMBULATORY STATUS COMMUNICATION OF NEEDS Skin   Extensive Assist Verbally Normal                       Personal Care Assistance Level of Assistance  Bathing, Feeding, Dressing Bathing Assistance: Maximum  assistance Feeding assistance: Maximum assistance Dressing Assistance: Maximum assistance     Functional Limitations Info  Sight, Hearing, Speech Sight Info: Adequate Hearing Info: Adequate Speech Info: Adequate    SPECIAL CARE FACTORS FREQUENCY                       Contractures Contractures Info: Not present    Additional Factors Info  Code Status, Allergies, Isolation Precautions Code Status Info: DNR Allergies Info: Boniva (Ibandronic Acid), Crestor (Rosuvastatin Calcium), Exelon (Rivastigmine Tartrate), Fosamax (Alendronate Sodium), Galantamine, Lipitor (Atorvastatin), Morphine And Related, Other, Vagifem (Estradiol)     Isolation Precautions Info: COVID +     Current Medications (10/07/2019):  This is the current hospital active medication list Current Facility-Administered Medications  Medication Dose Route Frequency Provider Last Rate Last Admin  . acetaminophen (TYLENOL) suppository 650 mg  650 mg Rectal Q6H PRN Gardiner Barefoot, NP   650 mg at 10/06/19 1437  . acetaminophen (TYLENOL) tablet 650 mg  650 mg Oral Q6H PRN Doutova, Anastassia, MD      . amLODipine (NORVASC) tablet 2.5 mg  2.5 mg Oral Daily Nita Sells, MD   2.5 mg at 10/07/19 0902  . aspirin chewable tablet 81 mg  81 mg Oral Daily Nita Sells, MD   81 mg at 10/07/19 0902  . clopidogrel (PLAVIX) tablet 75 mg  75 mg Oral Daily Nita Sells, MD   75 mg at 10/07/19 0902  . dexamethasone (DECADRON) injection 6 mg  6 mg Intravenous Q24H Nita Sells, MD   6 mg at 10/07/19  1324  . dextrose 5 % solution   Intravenous Continuous Nita Sells, MD 75 mL/hr at 10/07/19 0903 Rate Change at 10/07/19 0903  . feeding supplement (ENSURE ENLIVE) (ENSURE ENLIVE) liquid 237 mL  237 mL Oral TID BM Nita Sells, MD   237 mL at 10/07/19 1324  . guaiFENesin-dextromethorphan (ROBITUSSIN DM) 100-10 MG/5ML syrup 10 mL  10 mL Oral Q4H PRN Doutova, Anastassia, MD      .  heparin injection 5,000 Units  5,000 Units Subcutaneous Q8H Kirby-Graham, Karsten Fells, NP   5,000 Units at 10/07/19 1324  . levothyroxine (SYNTHROID, LEVOTHROID) injection 60 mcg  60 mcg Intravenous Daily Nita Sells, MD   60 mcg at 10/07/19 0903  . LORazepam (ATIVAN) injection 0.5 mg  0.5 mg Intravenous Q4H PRN Nita Sells, MD      . metoprolol tartrate (LOPRESSOR) injection 5 mg  5 mg Intravenous Q6H Nita Sells, MD   Stopped at 10/07/19 [phone]  . ondansetron (ZOFRAN) tablet 4 mg  4 mg Oral Q6H PRN Doutova, Anastassia, MD       Or  . ondansetron (ZOFRAN) injection 4 mg  4 mg Intravenous Q6H PRN Toy Baker, MD      . Resource ThickenUp Clear   Oral PRN Nita Sells, MD      . risperiDONE (RISPERDAL) tablet 0.5 mg  0.5 mg Oral BID Nita Sells, MD   0.5 mg at 10/07/19 0903  . sodium chloride flush (NS) 0.9 % injection 3 mL  3 mL Intravenous Q12H Toy Baker, MD   3 mL at 10/07/19 0909  . thiamine (B-1) injection 100 mg  100 mg Intravenous Daily Toy Baker, MD   100 mg at 10/07/19 0902     Discharge Medications: Please see discharge summary for a list of discharge medications.  Relevant Imaging Results:  Relevant Lab Results:   Additional Information SSN: [id number]              Palliative following at SNF.  Benard Halsted, LCSW

## 2019-10-07 NOTE — Evaluation (Signed)
Clinical/Bedside Swallow Evaluation Patient Details  Name: Cathy Johnson MRN: [id number] Date of Birth: 09-Jan-1928  Today's Date: 10/07/2019 Time: SLP Start Time (ACUTE ONLY): 1200 SLP Stop Time (ACUTE ONLY): 1230 SLP Time Calculation (min) (ACUTE ONLY): 30 min  Past Medical History:  Past Medical History:  Diagnosis Date  . Arthritis   . Dementia ([place])   . Hypertension   . Osteoarthritis of right knee 05/02/2014  . PFO (patent foramen ovale)    PFO by bubble study 02/05/11 TEE  . Stroke [facility])    Past Surgical History:  Past Surgical History:  Procedure Laterality Date  . BREAST SURGERY     LT BREAST MASS REMOVED  . CARPAL TUNNEL RELEASE    . KNEE ARTHROSCOPY     RIGHT  . PARTIAL KNEE ARTHROPLASTY Right 05/02/2014   Procedure: UNICOMPARTMENTAL KNEE;  Surgeon: Cathy Bridge, MD;  Location: [place];  Service: Orthopedics;  Laterality: Right;   HPI:  85 year old female dementia hypothyroid depression prior CVA's dating back to in starting 02/01/2011 09/17/2018 + 08/05/2018, + TIA 07/22/2018, 03/06/2017--(has a history of PFO) prior orthostatic hypotension-sent from [facility], diagnosed Covid 09/23/2019. Admitted 10/04/2019 for treatment of sepsis related to [place]. Order stated pt coughing with food and liquid per RN. CXR Improved aeration of the lungs with minimal residual right basilar atelectasis. No new findings. BSE 08/2018 puree and honey thick rec'd and upgraded to DYs 3, thin 2 days later.    Assessment / Plan / Recommendation Clinical Impression  Pt presents with altered mentation, poor awareness of PO, severe dysarthria and signs of right labial weakness. Unsure of baseline, but pt is definitely not capable of consuming the recommended dys 3 (mech soft) diet that she reportedly consumed PTA. Pt requries verbal and visual cueing to recognize spoon or cup. Puree and thickened liqudis are managed well without coughing, but there is immediate cough following thin liquids. Given  AMS, unknown baseline and concern for dysphagia in the setting of acute illness, recommend downgrade of diet to puree and honey thick liquids. Will f/u for potential upgrade if mentation improves during acute admission.  SLP Visit Diagnosis: Dysphagia, oropharyngeal phase (R13.12)    Aspiration Risk  Severe aspiration risk    Diet Recommendation Dysphagia 1 (Puree);Honey-thick liquid   Liquid Administration via: Spoon Medication Administration: Crushed with puree Supervision: Full supervision/cueing for compensatory strategies Compensations: Slow rate;Small sips/bites    Other  Recommendations Other Recommendations: Have oral suction available   Follow up Recommendations Skilled Nursing facility      Frequency and Duration min 2x/week  2 weeks       Prognosis        Swallow Study   General HPI: 85 year old female dementia hypothyroid depression prior CVA's dating back to in starting 02/01/2011 09/17/2018 + 08/05/2018, + TIA 07/22/2018, 03/06/2017--(has a history of PFO) prior orthostatic hypotension-sent from [facility], diagnosed Covid 09/23/2019. Admitted 10/04/2019 for treatment of sepsis related to [place]. Order stated pt coughing with food and liquid per RN. CXR Improved aeration of the lungs with minimal residual right basilar atelectasis. No new findings. BSE 08/2018 puree and honey thick rec'd and upgraded to DYs 3, thin 2 days later.  Type of Study: Bedside Swallow Evaluation Diet Prior to this Study: Dysphagia 3 (soft);Thin liquids Temperature Spikes Noted: No Respiratory Status: Room air History of Recent Intubation: No Behavior/Cognition: Alert;Doesn't follow directions Oral Cavity Assessment: Dry Oral Care Completed by SLP: Yes Oral Cavity - Dentition: Adequate natural dentition Vision: Impaired for self-feeding  Self-Feeding Abilities: Total assist Patient Positioning: Upright in bed Baseline Vocal Quality: Normal Volitional Cough: Cognitively unable to  elicit Volitional Swallow: Unable to elicit    Oral/Motor/Sensory Function Overall Oral Motor/Sensory Function: Within functional limits   Ice Chips Ice chips: Not tested   Thin Liquid Thin Liquid: Impaired Presentation: Cup Oral Phase Impairments: Reduced labial seal Pharyngeal  Phase Impairments: Cough - Immediate    Nectar Thick Nectar Thick Liquid: Impaired Presentation: Spoon;Cup Oral Phase Impairments: Poor awareness of bolus Oral phase functional implications: Prolonged oral transit   Honey Thick Honey Thick Liquid: Not tested   Puree Puree: Within functional limits Presentation: Spoon   Solid     Solid: Not tested     Cathy Baltimore, MA [place] Pager [phone] Office [phone]  Cathy Johnson, Cathy Johnson 10/07/2019,1:11 PM

## 2019-10-07 NOTE — TOC Initial Note (Addendum)
Transition of Care [facility]) - Initial/Assessment Note    Patient Details  Name: Cathy Johnson MRN: [id number] Date of Birth: 1928/04/12  Transition of Care [facility]) CM/SW Contact:    Benard Halsted, LCSW Phone Number: 10/07/2019, 3:59 PM  Clinical Narrative:                 3:59pm-Patient resides at [facility]. Per their liaison, patient will be able to return if medically stable this weekend. CSW left voicemail for patient's son to make him aware.   4:48pm-CSW spoke with patient's son, [name]. He confirmed plan for patient to return to [facility] but does express concern over if patient will remain awake enough to be able to stay hydrated at the facility. He states palliative care follows patient at [facility] through [employer] 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 CSW to continue to follow.   Expected Discharge Plan: [place] Barriers to Discharge: Continued Medical Work up   Patient Goals and CMS Choice        Expected Discharge Plan and Services Expected Discharge Plan: [place] In-house Referral: Clinical Social Work   Post Acute Care Choice: [place] Living arrangements for the past 2 months: [place]                                      Prior Living Arrangements/Services Living arrangements for the past 2 months: [place] Lives with:: Facility Resident Patient language and need for interpreter reviewed:: Yes        Need for Family Participation in Patient Care: Yes (Comment) Care giver support system in place?: Yes (comment)   Criminal Activity/Legal Involvement Pertinent to Current Situation/Hospitalization: No - Comment as needed  Activities of Daily Living      Permission Sought/Granted Permission sought to share information with : Facility Sport and exercise psychologist, Family Supports Permission granted to share information with :  No  Share Information with NAME: [name]  Permission granted to share info w AGENCY: [facility]  Permission granted to share info w Relationship: Son  Permission granted to share info w Contact Information: [phone]  Emotional Assessment Appearance:: Appears stated age Attitude/Demeanor/Rapport: Unable to Assess Affect (typically observed): Unable to Assess Orientation: : (Disoriented x4) Alcohol / Substance Use: Not Applicable Psych Involvement: No (comment)  Admission diagnosis:  Hyperchloremia [E87.8] Hypernatremia [E87.0] Acute kidney injury ([place]) [N17.9] Increased anion gap metabolic acidosis 99991111 Dementia without behavioral disturbance, unspecified dementia type (HCC) [F03.90] Localized swelling of right upper extremity [R22.31] COVID-19 virus infection [U07.1] COVID-19 [U07.1] Patient Active Problem List   Diagnosis Date Noted  . COVID-19 10/05/2019  . COVID-19 virus infection 10/04/2019  . Hypernatremia 10/04/2019  . Dehydration 10/04/2019  . Acute encephalopathy 11/01/2018  . CVA (cerebral vascular accident) ([place]) 09/18/2018  . Elevated alkaline phosphatase level 08/06/2018  . Hypothyroidism 08/06/2018  . AMS (altered mental status) 08/05/2018  . Acute CVA (cerebrovascular accident) ([place]) 08/05/2018  . Altered mental status 07/21/2018  . Facial droop 03/06/2017  . AKI (acute kidney injury) ([place]) 01/06/2016  . Syncope 01/06/2016  . Dementia ([place]) 01/06/2016  . Hypertension 01/06/2016  . Anemia 01/06/2016  . Osteoarthritis of right knee 05/02/2014  . Knee osteoarthritis 05/02/2014   PCP:  [facility] Pharmacy:  No Pharmacies Listed    Social Determinants of Health (SDOH) Interventions    Readmission  Risk Interventions Readmission Risk Prevention Plan 10/07/2019  Transportation Screening Complete  PCP or Specialist Appt within 3-5 Days Complete  HRI or [place] Complete  Social Work Consult for [place] Planning/Counseling  Complete  Palliative Care Screening Not Applicable  Medication Review Press photographer) Referral to Pharmacy  Some recent data might be hidden

## 2019-10-07 NOTE — Progress Notes (Signed)
Hospitalist daily note   JAZALYNN WOMELSDORF [id number] DOB: [birth year] DOA: 10/04/2019  PCP: Orvis Brill, [facility]   Narrative:  85 y/o [place] dementia hypothyroid depression prior CVA's dating back to in starting 02/01/2011 09/17/2018 + 08/05/2018, + TIA 07/22/2018, 03/06/2017--(has a history of PFO) prior orthostatic hypotension-sent from [facility], diagnosed Covid 09/23/2019 On arrival hypotensive 70/50 heart rate 110  Data Reviewed:  Sodium 163-->160-->156-->147  chloride 127-->130-->128-->120 CO2 15  BUN/creatinine 110/5.4-->103/4.3-->71/2.24-->47/1.58 CK 538 -- LDH 286 Ferritin 508-405--312 CRP 7.7-->6.5->7.9-->5.3 D-dimer 6.2-->2.3->2.02-->1.75 Lactic acid 1.7-->1.9 White count 14.4-->11.0-->9.3-->6.3 Portable chest x-ray improved aeration lungs minimal residual right basilar atelectasis no new findings Assessment & Plan: Sepsis secondary to coronavirus 19 Hypotension Resolving-cont IVF, check labs am--no role Abx inflamm markers down trending and rpt in am Starting Decadron 6 IV daily given mild oxygen req Holding Remdisivir/Actrema at this time Cont robitussin--try to prone AKI severe hypernatremia metabolic acidosis Likely from ARB/ATN from dual ARB ace Change fluids to only D5 75/h--labs improving At least 5 CVAs with multi-infarct dementia and prior PFO hold Plavix 75, aspirin 81 Needed Ativan 12/17 but became sleepy--Hold for now--she is unable to really orient HTN At this time holding lisinopril 40 Micardis 80 Some hypotension stop clonidine 0.3 twice Amlodipine 2.5 as well today Hypothyroid Replace with half of his usual home dose give 60 mcg daily IV as not taking PO Severe dementia Patient not eating not drinking May need to discuss this with family if does not improve  Lovenox, DNR MOST form filled,  Inpatient Called son Zyanya Wormald is getting better not needing oxygen likely weekend d/c this am--Son aware   Subjective:  sleepiness  persists--took meds per RN but promptly went back to sleep  Objective: Vitals:   10/06/19 1426 10/06/19 1600 10/06/19 1958 10/07/19 0231  BP: (!) 133/98 (!) 109/53 119/79 138/67  Pulse: (!) 120 (!) 102 87 82  Resp: 19 14 16    Temp: (!) 97 F (36.1 C) 99.8 F (37.7 C) 99.1 F (37.3 C) 98.5 F (36.9 C)  TempSrc: Axillary Axillary Axillary Axillary  SpO2:   99% 98%    Intake/Output Summary (Last 24 hours) at 10/07/2019 0815 Last data filed at 10/07/2019 0100 Gross per 24 hour  Intake 1010 ml  Output 300 ml  Net 710 ml   There were no vitals filed for this visit.  Examination: frail neck soft EOMI NCAT S1-S2 no murmur Chest is clinically clear  Abdomen soft- no rebound no guarding  neurologically difficult to assess this am as sleepy  Scheduled Meds: . amLODipine  2.5 mg Oral Daily  . aspirin  81 mg Oral Daily  . cloNIDine  0.2 mg Oral BID  . clopidogrel  75 mg Oral Daily  . dexamethasone (DECADRON) injection  6 mg Intravenous Q24H  . feeding supplement (ENSURE ENLIVE)  237 mL Oral TID BM  . heparin injection (subcutaneous)  5,000 Units Subcutaneous Q8H  . levothyroxine  60 mcg Intravenous Daily  . metoprolol tartrate  5 mg Intravenous Q6H  . risperiDONE  0.5 mg Oral BID  . sodium chloride flush  3 mL Intravenous Q12H  . thiamine injection  100 mg Intravenous Daily   Continuous Infusions: . dextrose 100 mL/hr at 10/06/19 1702     LOS: 3 days   Time spent: [place], MD [facility]

## 2019-10-08 LAB — CBC WITH DIFFERENTIAL/PLATELET
Abs Immature Granulocytes: 0.07 10*3/uL (ref 0.00–0.07)
Basophils Absolute: 0 10*3/uL (ref 0.0–0.1)
Basophils Relative: 0 %
Eosinophils Absolute: 0 10*3/uL (ref 0.0–0.5)
Eosinophils Relative: 0 %
HCT: 35.8 % — ABNORMAL LOW (ref 36.0–46.0)
Hemoglobin: 11.5 g/dL — ABNORMAL LOW (ref 12.0–15.0)
Immature Granulocytes: 1 %
Lymphocytes Relative: 17 %
Lymphs Abs: 1.4 10*3/uL (ref 0.7–4.0)
MCH: 30.9 pg (ref 26.0–34.0)
MCHC: 32.1 g/dL (ref 30.0–36.0)
MCV: 96.2 fL (ref 80.0–100.0)
Monocytes Absolute: 0.4 10*3/uL (ref 0.1–1.0)
Monocytes Relative: 5 %
Neutro Abs: 6.5 10*3/uL (ref 1.7–7.7)
Neutrophils Relative %: 77 %
Platelets: 164 10*3/uL (ref 150–400)
RBC: 3.72 MIL/uL — ABNORMAL LOW (ref 3.87–5.11)
RDW: 13.5 % (ref 11.5–15.5)
WBC: 8.4 10*3/uL (ref 4.0–10.5)
nRBC: 0 % (ref 0.0–0.2)

## 2019-10-08 LAB — COMPREHENSIVE METABOLIC PANEL
ALT: 14 U/L (ref 0–44)
AST: 18 U/L (ref 15–41)
Albumin: 3 g/dL — ABNORMAL LOW (ref 3.5–5.0)
Alkaline Phosphatase: 65 U/L (ref 38–126)
Anion gap: 10 (ref 5–15)
BUN: 31 mg/dL — ABNORMAL HIGH (ref 8–23)
CO2: 18 mmol/L — ABNORMAL LOW (ref 22–32)
Calcium: 8.5 mg/dL — ABNORMAL LOW (ref 8.9–10.3)
Chloride: 113 mmol/L — ABNORMAL HIGH (ref 98–111)
Creatinine, Ser: 1.34 mg/dL — ABNORMAL HIGH (ref 0.44–1.00)
GFR calc Af Amer: 41 mL/min — ABNORMAL LOW (ref >60)
GFR calc non Af Amer: 35 mL/min — ABNORMAL LOW (ref >60)
Glucose, Bld: 104 mg/dL — ABNORMAL HIGH (ref 70–99)
Potassium: 3.5 mmol/L (ref 3.5–5.1)
Sodium: 141 mmol/L (ref 135–145)
Total Bilirubin: 0.6 mg/dL (ref 0.3–1.2)
Total Protein: 6.1 g/dL — ABNORMAL LOW (ref 6.5–8.1)

## 2019-10-08 LAB — GLUCOSE, CAPILLARY
Glucose-Capillary: 94 mg/dL (ref 70–99)
Glucose-Capillary: 112 mg/dL — ABNORMAL HIGH (ref 70–99)

## 2019-10-08 LAB — C-REACTIVE PROTEIN: CRP: 1.5 mg/dL — ABNORMAL HIGH (ref <1.0)

## 2019-10-08 LAB — D-DIMER, QUANTITATIVE: D-Dimer, Quant: 2.92 ug/mL-FEU — ABNORMAL HIGH (ref 0.00–0.50)

## 2019-10-08 LAB — MAGNESIUM: Magnesium: 1.9 mg/dL (ref 1.7–2.4)

## 2019-10-08 LAB — FERRITIN: Ferritin: 343 ng/mL — ABNORMAL HIGH (ref 11–307)

## 2019-10-08 MED ORDER — LEVOTHYROXINE SODIUM 100 MCG/5ML IV SOLN
50.0000 ug | Freq: Every day | INTRAVENOUS | Status: DC
  Administered 2019-10-09: 50 ug via INTRAVENOUS
  Filled 2019-10-08 (×2): qty 5

## 2019-10-08 MED ORDER — LEVOTHYROXINE SODIUM 112 MCG PO TABS: 112.0000 ug | ORAL_TABLET | Freq: Every day | ORAL | Status: DC

## 2019-10-08 NOTE — Progress Notes (Signed)
[place]  Ms. Stuffle was recently referred for home palliative care services. Will follow and notify Palliative Services upon pt's discharge pending active services.  Thank you for the referral.  Vicente Serene, BSN [facility] Liaison [phone]

## 2019-10-08 NOTE — Progress Notes (Signed)
Hospitalist daily note   Cathy Johnson [id number] DOB: [birth year] DOA: 10/04/2019  PCP: Orvis Brill, [facility]   Narrative:  85 y/o [place] dementia hypothyroid depression prior CVA's dating back to in starting 02/01/2011 09/17/2018 + 08/05/2018, + TIA 07/22/2018, 03/06/2017--(has a history of PFO) prior orthostatic hypotension-sent from [facility], diagnosed Covid 09/23/2019 On arrival hypotensive 70/50 heart rate 110  Data Reviewed:  Sodium 163-->160-->156-->147  chloride 127-->130-->128-->120-->113 CO2 15 -->118 BUN/creatinine 110/5.4-->103/4.3-->71/2.24-->47/1.58-->31/1.34 CK 538 -- LDH 286 Ferritin 508-405--312--342 CRP 7.7-->6.5->7.9-->5.3--1.5 D-dimer 6.2-->2.3->2.02-->1.75--2.92 Lactic acid 1.7-->1.9 White count 14.4-->11.0-->9.3-->6.3-->8.4 Portable chest x-ray improved aeration lungs minimal residual right basilar atelectasis no new findings Assessment & Plan: Metabolic encephalopathy Has not woken up for the past several days will discontinue all oral meds as inability to give-see below discussion Sepsis secondary to coronavirus 19 Hypotension Resolving-cont IVF, check labs am--no role Abx inflamm markers down trending and rpt in am Starting Decadron 6 IV 12/17-->12/26 Holding Remdisivir/Actrema at this time AKI severe hypernatremia metabolic acidosis Likely from ARB/ATN from dual ARB ace Change fluids to only D5 75/h--labs improving At least 5 CVAs with multi-infarct dementia and prior PFO hold Plavix 75, aspirin 81 given inability to take p.o. HTN From admission holding lisinopril 40 Micardis 80 Some hypotension stop clonidine 0.3 twice Amlodipine 2.5 as well today Hypothyroid Replace with half of his usual home dose give 60 mcg daily IV as not taking PO Severe dementia Patient not eating not drinking May need to discuss this with family if does not improve  Lovenox, DNR MOST form filled,  Inpatient Called son Thetis Sparling has declined and has  not awakened so it has been suggested that patient would probably benefit from returning to her faciltiy with Pallaitve care following-stop all labs at this time, maintain comfort, will contact social worker to see if can return to [facility] in a.m. with palliative care following likely weekend d/c this am--Son aware   Subjective:  Not taking meds not alert enough to give Remains very sleepy and arouses minimally  Objective: Vitals:   10/08/19 0450 10/08/19 0548 10/08/19 1100 10/08/19 1130  BP: (!) 158/87 (!) 147/87 (!) 128/93   Pulse: 75  (!) 107 (!) 113  Resp: 20  (!) 24 20  Temp: 98.8 F (37.1 C) 97.9 F (36.6 C) 97.9 F (36.6 C)   TempSrc: Oral  Oral   SpO2: 96%   100%  Weight:  58.5 kg      Intake/Output Summary (Last 24 hours) at 10/08/2019 1246 Last data filed at 10/08/2019 [id number] Gross per 24 hour  Intake 220 ml  Output 700 ml  Net -480 ml   Filed Weights   10/08/19 0548  Weight: 58.5 kg    Examination: frail neck soft EOMI NCAT S1-S2 no murmur Chest is clinically clear  Abdomen soft- no rebound no guarding  neurologically cannot assess appropriately  Scheduled Meds: . amLODipine  2.5 mg Oral Daily  . aspirin  81 mg Oral Daily  . clopidogrel  75 mg Oral Daily  . dexamethasone (DECADRON) injection  6 mg Intravenous Q24H  . feeding supplement (ENSURE ENLIVE)  237 mL Oral TID BM  . heparin injection (subcutaneous)  5,000 Units Subcutaneous Q8H  . [START ON 10/09/2019] levothyroxine  112 mcg Oral Q0600  . metoprolol tartrate  5 mg Intravenous Q6H  . risperiDONE  0.5 mg Oral BID  . sodium chloride flush  3 mL Intravenous Q12H  . thiamine injection  100 mg Intravenous Daily   Continuous Infusions: .  dextrose 75 mL/hr at 10/08/19 0628     LOS: 4 days   Time spent: [place], MD [facility]

## 2019-10-09 LAB — CULTURE, BLOOD (ROUTINE X 2)
Culture: NO GROWTH
Culture: NO GROWTH
Special Requests: ADEQUATE

## 2019-10-09 LAB — GLUCOSE, CAPILLARY: Glucose-Capillary: 95 mg/dL (ref 70–99)

## 2019-10-09 MED ORDER — RISPERIDONE 0.5 MG PO TABS: 0.5000 mg | ORAL_TABLET | Freq: Two times a day (BID) | ORAL | 0 refills | Status: AC

## 2019-10-09 MED ORDER — ACETAMINOPHEN 650 MG RE SUPP: 650.0000 mg | Freq: Four times a day (QID) | RECTAL | 0 refills | Status: AC | PRN

## 2019-10-09 MED ORDER — LORAZEPAM 0.5 MG PO TABS: 0.5000 mg | ORAL_TABLET | Freq: Every day | ORAL | 0 refills | Status: AC

## 2019-10-09 NOTE — Progress Notes (Signed)
Cathy Johnson to be D/C'd Skilled nursing facility per MD order.  Discussed with the patient and all questions fully answered.  VSS, Skin clean, dry and intact without evidence of skin break down, no evidence of skin tears noted. IV catheter discontinued intact. Site without signs and symptoms of complications. Dressing and pressure applied.  An After Visit Summary was printed and given to [facility] for [facility].  Pt transported to [facility] [facility] [employer] [facility]. Luci Bank, RN  10/09/2019 11:34 AM

## 2019-10-09 NOTE — TOC Transition Note (Signed)
Transition of Care [facility]) - CM/SW Discharge Note   Patient Details  Name: Cathy Johnson MRN: [id number] Date of Birth: 1927-12-17  Transition of Care [facility]) CM/SW Contact:  Alberteen Sam, LCSW Phone Number: 10/09/2019, 9:37 AM   Clinical Narrative:     [facility] Anticipated DC date: 10/09/2019 Family notified: [name] (son) Transport [id number]  Per MD patient ready for DC to [facility] . RN, patient, patient's family, and facility notified of DC. Discharge Summary sent to facility. RN given number for report [phone]   . DC packet on chart. Ambulance transport requested for patient.  CSW signing off.  [place]   Final next level of care: Skilled Nursing Facility Barriers to Discharge: No Barriers Identified   Patient Goals and CMS Choice   CMS Medicare.gov Compare Post Acute Care list provided to:: Patient Represenative (must comment)([place] (son)) Choice offered to / list presented to : Adult Children  Discharge Placement PASRR number recieved: 10/07/19            Patient chooses bed at: [facility] Patient to be transferred to facility by: [place] Name of family member notified: [name] (son) Patient and family notified of of transfer: 10/09/19  Discharge Plan and Services In-house Referral: Clinical Social Work   Post Acute Care Choice: [place]                               Social Determinants of Health (SDOH) Interventions     Readmission Risk Interventions Readmission Risk Prevention Plan 10/07/2019  Transportation Screening Complete  PCP or Specialist Appt within 3-5 Days Complete  HRI or [place] Complete  Social Work Consult for [place] Planning/Counseling Complete  Palliative Care Screening Not Applicable  Medication Review Press photographer) Referral to Pharmacy  Some recent data might be hidden

## 2019-10-09 NOTE — Discharge Summary (Signed)
Physician Discharge Summary  Cathy Johnson [id number] DOB: 1928/02/20 DOA: 10/04/2019  PCP: Orvis Brill, [facility]  Admit date: 10/04/2019 Discharge date: 10/09/2019  Time spent: 30 minutes  Recommendations for Outpatient Follow-up:  1. Palliative care to follow the patient on discharge for goals of care at facility 2. Minimize multiple meds  Discharge Diagnoses:  Active Problems:   AKI (acute kidney injury) ([place])   Dementia ([place])   Hypertension   Hypothyroidism   Acute encephalopathy   COVID-19 virus infection   Hypernatremia   Dehydration   COVID-19   Discharge Condition: Guarded  Diet recommendation: Dysphagia 1 diet and honey thick  Filed Weights   10/08/19 0548  Weight: 58.5 kg    History of present illness:  85 y/o Cathy Johnson dementia hypothyroid depression prior CVA's dating back to in starting 02/01/2011 09/17/2018 + 08/05/2018, + TIA 07/22/2018, 03/06/2017--(has a history of PFO) prior orthostatic hypotension-sent from [facility], diagnosed Covid 09/23/2019 On arrival hypotensive 70/50 heart rate 110  Hospital Course:  Sepsis secondary to coronavirus 19 Hypotension Resolving-cont IVF, check labs am--no role Abx inflamm markers overall trended downwards She received in the hospital Decadron 3-4 doses and did not receive remdesivir or Actemra see below AKI severe hypernatremia metabolic acidosis Initial creatinine was 5 Likely from ARB/ATN from dual ARB ace Discontinued fluids on discharge At least 5 CVAs with multi-infarct dementia and prior PFO hold Plavix 75, aspirin 81 on discharge HTN Home medications discontinued during hospital stay Hypothyroid Replace with half of his usual home dose give 60 mcg daily IV as not taking PO-discontinue levothyroxine Severe dementia Patient not eating not drinking Long discussions with son in terms of planning and goals of care she is aspirating she is not awakening he understands fully that she may not  recover from this I spoke to him about this and he is okay with palliative care following the patient in the outpatient setting   Discharge Exam: Vitals:   10/09/19 0500 10/09/19 0600  BP: (!) 159/92 128/66  Pulse:  97  Resp:  (!) 25  Temp: 99.7 F (37.6 C)   SpO2:  90%    General: Minimal responsiveness awakens to menace Cardiovascular: S1-S2 no murmur Respiratory: Practically bilaterally Abdomen soft no rebound no guarding Neurologically intact no focal deficits  Discharge Instructions   Discharge Instructions    Diet - low sodium heart healthy   Complete by: As directed    Increase activity slowly   Complete by: As directed      Allergies as of 10/09/2019      Reactions   Boniva [ibandronic Acid] Other (See Comments)   "Allergic," per [facility]   Crestor [rosuvastatin Calcium] Other (See Comments)   "Allergic," per [facility]   Exelon [rivastigmine Tartrate] Other (See Comments)   "Allergic," per [facility]   Fosamax [alendronate Sodium] Other (See Comments)   "Allergic," per [facility]   Galantamine Other (See Comments)   "Allergic," per [facility]   Lipitor [atorvastatin] Other (See Comments)   "Allergic," per [facility]   Morphine And Related Other (See Comments)   "Allergic," per [facility]   Other Other (See Comments)   "Sedatives" and "Multiple, unknown B/P meds" = "Allergic," per [facility]   Vagifem [estradiol] Other (See Comments)   "Allergic," per [facility]      Medication List    STOP taking these medications   amLODipine 2.5 MG tablet Commonly known as: NORVASC   aspirin 81 MG chewable tablet   atorvastatin 20 MG tablet Commonly known as: LIPITOR  b complex vitamins capsule   Calci-Chew 1250 (500 Ca) MG chewable tablet Generic drug: calcium carbonate   cloNIDine 0.2 MG tablet Commonly known as: CATAPRES   clopidogrel 75 MG tablet Commonly known as: PLAVIX   ferrous sulfate 325 (65 FE) MG tablet   lisinopril 40 MG tablet Commonly known as: ZESTRIL   loperamide 2 MG tablet Commonly  known as: IMODIUM A-D   sertraline 100 MG tablet Commonly known as: ZOLOFT   telmisartan 80 MG tablet Commonly known as: MICARDIS   vitamin B-12 500 MCG tablet Commonly known as: CYANOCOBALAMIN   Vitamin D3 50 MCG (2000 UT) Tabs     TAKE these medications   acetaminophen 325 MG tablet Commonly known as: TYLENOL Take 650 mg by mouth 3 (three) times daily. What changed: Another medication with the same name was added. Make sure you understand how and when to take each.   acetaminophen 650 MG suppository Commonly known as: TYLENOL Place 1 suppository (650 mg total) rectally every 6 (six) hours as needed for fever, mild pain or moderate pain (temp over 101). What changed: You were already taking a medication with the same name, and this prescription was added. Make sure you understand how and when to take each.   diclofenac sodium 1 % Gel Commonly known as: VOLTAREN Apply 4 g topically See admin instructions. Apply 4 grams to both knees two times a day and an additional 4 grams to both knees two times a day as needed for arthritis pain   diclofenac Sodium 1 % Gel Commonly known as: VOLTAREN Apply 4 g topically 2 (two) times daily as needed (knee pain).   guaifenesin 100 MG/5ML syrup Commonly known as: ROBITUSSIN Take 200 mg by mouth every 6 (six) hours as needed (for 3 days, for coughing and notify MD if coughs persists for more than 3 days).   levothyroxine 112 MCG tablet Commonly known as: SYNTHROID Take 112 mcg by mouth daily before breakfast.   LORazepam 0.5 MG tablet Commonly known as: ATIVAN Take 0.5 tablets (0.25 mg total) by mouth 2 (two) times daily as needed for anxiety. What changed:   how much to take  when to take this  additional instructions   memantine 5 MG tablet Commonly known as: NAMENDA Take 5 mg by mouth 2 (two) times daily.   polyethylene glycol 17 g packet Commonly known as: MIRALAX / GLYCOLAX Take 17 g by mouth daily.   risperiDONE 0.5  MG tablet Commonly known as: RISPERDAL Take 0.5 mg by mouth 2 (two) times daily.      Allergies  Allergen Reactions  . Boniva [Ibandronic Acid] Other (See Comments)    "Allergic," per [facility]  . Crestor [Rosuvastatin Calcium] Other (See Comments)    "Allergic," per [facility]  . Exelon [Rivastigmine Tartrate] Other (See Comments)    "Allergic," per [facility]  . Fosamax [Alendronate Sodium] Other (See Comments)    "Allergic," per [facility]  . Galantamine Other (See Comments)    "Allergic," per [facility]  . Lipitor [Atorvastatin] Other (See Comments)    "Allergic," per [facility]  . Morphine And Related Other (See Comments)    "Allergic," per [facility]  . Other Other (See Comments)    "Sedatives" and "Multiple, unknown B/P meds" = "Allergic," per [facility]  . Vagifem [Estradiol] Other (See Comments)    "Allergic," per [facility]      The results of significant diagnostics from this hospitalization (including imaging, microbiology, ancillary and laboratory) are listed below for reference.  Significant Diagnostic Studies: DG Chest Port 1 View  Result Date: 10/04/2019 CLINICAL DATA:  Hypotension. Possible right arm DVT. Shortness of breath with hypoxia. COVID-19 infection. EXAM: PORTABLE CHEST 1 VIEW COMPARISON:  Radiographs 11/01/2018 and 08/05/2018. FINDINGS: 1533 hours. There is improved aeration of the lungs compared with the previous study with minimal residual right basilar atelectasis. No significant confluent or ground-glass pulmonary opacities. The heart size and mediastinal contours are stable. There is mild aortic atherosclerosis. No pleural effusion or pneumothorax. Telemetry leads overlie the chest. IMPRESSION: Improved aeration of the lungs with minimal residual right basilar atelectasis. No new findings. Electronically Signed   By: Richardean Sale M.D.   On: 10/04/2019 16:05   UE VENOUS DUPLEX ([facility] & [facility] 7 am - 7 pm)  Result Date: 10/04/2019 UPPER VENOUS STUDY  Indications: Swelling Other Indications: Covid positive.  Comparison Study: No priors. Performing Technologist: Oda Cogan RDMS, RVT  Examination Guidelines: A complete evaluation includes B-mode imaging, spectral Doppler, color Doppler, and power Doppler as needed of all accessible portions of each vessel. Bilateral testing is considered an integral part of a complete examination. Limited examinations for reoccurring indications may be performed as noted.  Right Findings: +----------+------------+---------+-----------+----------+-------+ RIGHT     CompressiblePhasicitySpontaneousPropertiesSummary +----------+------------+---------+-----------+----------+-------+ IJV           Full       Yes       Yes                      +----------+------------+---------+-----------+----------+-------+ Subclavian    Full       Yes       Yes                      +----------+------------+---------+-----------+----------+-------+ Axillary      Full       Yes       Yes                      +----------+------------+---------+-----------+----------+-------+ Brachial      Full       Yes       Yes                      +----------+------------+---------+-----------+----------+-------+ Radial        Full                                          +----------+------------+---------+-----------+----------+-------+ Ulnar         Full                                          +----------+------------+---------+-----------+----------+-------+ Cephalic      Full                                          +----------+------------+---------+-----------+----------+-------+ Basilic       Full                                          +----------+------------+---------+-----------+----------+-------+  Left Findings: +----------+------------+---------+-----------+----------+-------+ LEFT  CompressiblePhasicitySpontaneousPropertiesSummary +----------+------------+---------+-----------+----------+-------+ Subclavian               Yes        Yes              Patent  +----------+------------+---------+-----------+----------+-------+  Summary:  Right: No evidence of deep vein thrombosis in the upper extremity. No evidence of superficial vein thrombosis in the upper extremity.  Left: No evidence of thrombosis in the subclavian.  *See table(s) above for measurements and observations.  Diagnosing physician: Deitra Mayo MD Electronically signed by Deitra Mayo MD on 10/04/2019 at 7:58:52 PM.    Final     Microbiology: Recent Results (from the past 240 hour(s))  Blood Culture (routine x 2)     Status: None (Preliminary result)   Collection Time: 10/04/19  6:20 PM   Specimen: BLOOD RIGHT ARM  Result Value Ref Range Status   Specimen Description BLOOD RIGHT ARM  Final   Special Requests   Final    BOTTLES DRAWN AEROBIC AND ANAEROBIC Blood Culture adequate volume   Culture   Final    NO GROWTH 4 DAYS Performed at [facility], [address], [place], [place] [address]    Report Status PENDING  Incomplete  Blood Culture (routine x 2)     Status: None (Preliminary result)   Collection Time: 10/04/19  6:20 PM   Specimen: BLOOD  Result Value Ref Range Status   Specimen Description BLOOD SITE NOT SPECIFIED  Final   Special Requests   Final    BOTTLES DRAWN AEROBIC AND ANAEROBIC Blood Culture results may not be optimal due to an inadequate volume of blood received in culture bottles   Culture   Final    NO GROWTH 4 DAYS Performed at [facility], [place] [address] [place], [place] [address]    Report Status PENDING  Incomplete  MRSA PCR Screening     Status: None   Collection Time: 10/06/19  5:14 AM   Specimen: Nasopharyngeal  Result Value Ref Range Status   MRSA by PCR NEGATIVE NEGATIVE Final    Comment:        The GeneXpert MRSA Assay (FDA approved for NASAL specimens only), is one component of a comprehensive MRSA colonization surveillance program. It is not intended to diagnose  MRSA infection nor to guide or monitor treatment for MRSA infections. Performed at [facility], [place] [address], [place], [place] [address]      Labs: Basic Metabolic Panel: Recent Labs  Lab 10/05/19 0818 10/06/19 0452 10/06/19 1630 10/07/19 0525 10/07/19 1629 10/08/19 0529  NA 162* 156* 152* 147* 144 141  K 4.6 3.8 4.0 3.9 3.3* 3.5  CL >130* 128* 128* 120* 116* 113*  CO2 14* 18* 15* 19* 18* 18*  GLUCOSE 103* 128* 116* 147* 112* 104*  BUN 96* 71* 60* 47* 39* 31*  CREATININE 3.64* 2.24* 1.83* 1.58* 1.42* 1.34*  CALCIUM 8.4* 8.7* 8.3* 8.3* 8.6* 8.5*  MG 2.6* 2.5*  --  2.0  --  1.9   Liver Function Tests: Recent Labs  Lab 10/05/19 0818 10/05/19 1851 10/06/19 0452 10/07/19 0525 10/08/19 0529  AST 22 23 24 17 18   ALT 14 14 15 13 14   ALKPHOS 68 68 66 60 65  BILITOT 0.6 0.5 0.6 0.6 0.6  PROT 6.3* 7.0 6.2* 5.4* 6.1*  ALBUMIN 3.5 3.5 3.3* 2.7* 3.0*   No results for input(s): LIPASE, AMYLASE in the last 168 hours. No results for input(s): AMMONIA in  the last 168 hours. CBC: Recent Labs  Lab 10/04/19 1822 10/05/19 0628 10/06/19 0452 10/07/19 0525 10/08/19 0529  WBC 14.4* 11.0* 9.3 6.3 8.4  NEUTROABS 11.0* 8.1* 6.5 4.8 6.5  HGB 15.0 13.0 12.5 10.6* 11.5*  HCT 48.6* 44.0 41.2 34.8* 35.8*  MCV 99.8 104.5* 100.7* 100.0 96.2  PLT 256 189 193 149* 164   Cardiac Enzymes: Recent Labs  Lab 10/05/19 0116  CKTOTAL 538*   BNP: BNP (last 3 results) Recent Labs    11/01/18 1310  BNP 161.0*    ProBNP (last 3 results) No results for input(s): PROBNP in the last 8760 hours.  CBG: Recent Labs  Lab 10/05/19 1930 10/08/19 1718 10/08/19 2047 10/09/19 0744  GLUCAP 83 94 112* 95       Signed:  Nita Sells MD   [facility] 10/09/2019, 9:03 AM

## 2019-10-10 ENCOUNTER — Other Ambulatory Visit: Payer: Self-pay

## 2019-10-10 ENCOUNTER — Non-Acute Institutional Stay: Payer: Medicare Other | Admitting: Adult Health Nurse Practitioner

## 2019-10-10 DIAGNOSIS — F0390 Unspecified dementia without behavioral disturbance: Secondary | ICD-10-CM

## 2019-10-10 DIAGNOSIS — Z515 Encounter for palliative care: Secondary | ICD-10-CM

## 2019-10-10 DIAGNOSIS — U071 COVID-19: Secondary | ICD-10-CM

## 2019-10-10 NOTE — Progress Notes (Signed)
[facility] Palliative Care Consult Note Telephone: [phone]  Fax: [phone]  PATIENT NAME: Cathy Johnson DOB: Dec 13, 1927 MRN: [id number]  PRIMARY CARE PROVIDER:   Dr. Toni Arthurs PROVIDER: Dr. Aubery Lapping  RESPONSIBLE PARTY:   Carrin Bartz, son  H: [phone]  C: [phone]  Due to the COVID-19 crisis, this visit was done via telemedicine and it was initiated and consent by this patient and or family. Video-audio (telehealth) contact was unable to be done due to technical barriers from the patient's side.       RECOMMENDATIONS and PLAN:  1.  Advanced care planning.  Patient is a DNR  2.  Dementia.  Patient was hospitalized12/15-12/20/2020 with sepsis related to COVID infection.  Patient is not eating and is barely responsive.  Spoke with son who states that it is the same report he received from the hospital doctors.  Though she was able to be weaned off the oxygen she still was not eating.  Requires total care for all ADLs.  She was 142.8 in September and now weighs 128.7.  Per son she was always a good eater but now she is not eating anything.  Spoke with Dr. Aubery Lapping and she states that she is barely responsive and that she would benefit with hospice services.  Spoke with hospice physician who also agrees she is hospice appropriate.  Discussed patient's current status with the son and he would like hospice services for his mother.  Dr. Aubery Lapping will send in referral for hospice.    I spent 50 minutes providing this consultation,  from 3:30 to 4:20. More than 50% of the time in this consultation was spent coordinating communication.   HISTORY OF PRESENT ILLNESS:  Cathy Johnson is a 85 y.o. year old female with multiple medical problems including dementia, OA, HTN, h/o CVA. Palliative Care was asked to help address goals of care.   CODE STATUS: DNR  PPS: 20% HOSPICE ELIGIBILITY/DIAGNOSIS: yes/ dementia/ had COVID and is now not  eating and barely responsive  PHYSICAL EXAM:   Deferred   PAST MEDICAL HISTORY:  Past Medical History:  Diagnosis Date  . Arthritis   . Dementia ([place])   . Hypertension   . Osteoarthritis of right knee 05/02/2014  . PFO (patent foramen ovale)    PFO by bubble study 02/05/11 TEE  . Stroke [facility])     SOCIAL HX:  Social History   Tobacco Use  . Smoking status: Never Smoker  . Smokeless tobacco: Never Used  Substance Use Topics  . Alcohol use: No    ALLERGIES:  Allergies  Allergen Reactions  . Boniva [Ibandronic Acid] Other (See Comments)    "Allergic," per [facility]  . Crestor [Rosuvastatin Calcium] Other (See Comments)    "Allergic," per [facility]  . Exelon [Rivastigmine Tartrate] Other (See Comments)    "Allergic," per [facility]  . Fosamax [Alendronate Sodium] Other (See Comments)    "Allergic," per [facility]  . Galantamine Other (See Comments)    "Allergic," per [facility]  . Lipitor [Atorvastatin] Other (See Comments)    "Allergic," per [facility]  . Morphine And Related Other (See Comments)    "Allergic," per [facility]  . Other Other (See Comments)    "Sedatives" and "Multiple, unknown B/P meds" = "Allergic," per [facility]  . Vagifem [Estradiol] Other (See Comments)    "Allergic," per [facility]     PERTINENT MEDICATIONS:  Outpatient Encounter Medications as of 10/10/2019  Medication Sig  . acetaminophen (TYLENOL) 325 MG tablet  Take 650 mg by mouth 3 (three) times daily.   Marland Kitchen acetaminophen (TYLENOL) 650 MG suppository Place 1 suppository (650 mg total) rectally every 6 (six) hours as needed for fever, mild pain or moderate pain (temp over 101).  Marland Kitchen diclofenac sodium (VOLTAREN) 1 % GEL Apply 4 g topically See admin instructions. Apply 4 grams to both knees two times a day and an additional 4 grams to both knees two times a day as needed for arthritis pain  . diclofenac Sodium (VOLTAREN) 1 % GEL Apply 4 g topically 2 (two) times daily as needed (knee pain).  Marland Kitchen guaifenesin (ROBITUSSIN) 100 MG/5ML syrup Take 200 mg by mouth  every 6 (six) hours as needed (for 3 days, for coughing and notify MD if coughs persists for more than 3 days).   Marland Kitchen levothyroxine (SYNTHROID, LEVOTHROID) 112 MCG tablet Take 112 mcg by mouth daily before breakfast.  . LORazepam (ATIVAN) 0.5 MG tablet Take 1 tablet (0.5 mg total) by mouth daily. agitation  . memantine (NAMENDA) 5 MG tablet Take 5 mg by mouth 2 (two) times daily.   . polyethylene glycol (MIRALAX / GLYCOLAX) packet Take 17 g by mouth daily.  . risperiDONE (RISPERDAL) 0.5 MG tablet Take 1 tablet (0.5 mg total) by mouth 2 (two) times daily.   No facility-administered encounter medications on file as of 10/10/2019.      Kalim Kissel Jenetta Downer, NP

## 2019-10-21 DEATH — deceased

## 2020-09-04 IMAGING — CT CT HEAD CODE STROKE
3 series · 15 of 47 positions shown, 18 images · non-contrast
Comparison: 07/20/2018.  Multiple previous.

CLINICAL DATA: Code stroke.  Slurred speech.  Right facial droop.

EXAM:
CT HEAD WITHOUT CONTRAST
TECHNIQUE: Contiguous axial images were obtained from the base of the skull
through the vertex without intravenous contrast.

[Series 3: head 5.0 st · axial · 0.46mm/px · z∈[-179,-44]mm · 9 of 33 slices shown, 12 images]
[im 3/33  brain]
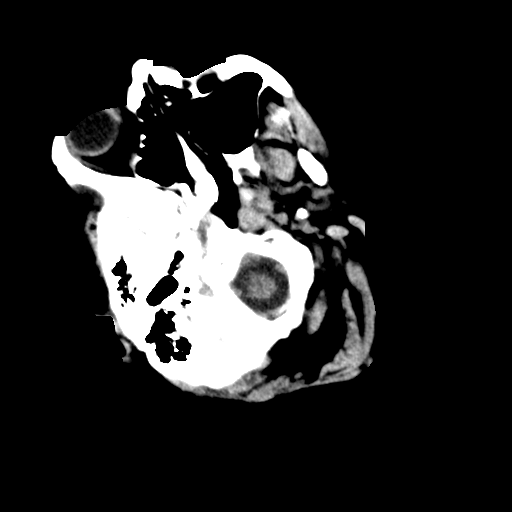
[im 3/33  bone]
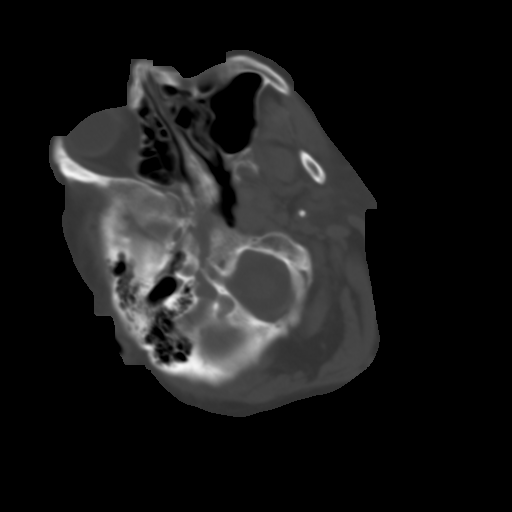
[im 6/33  brain]
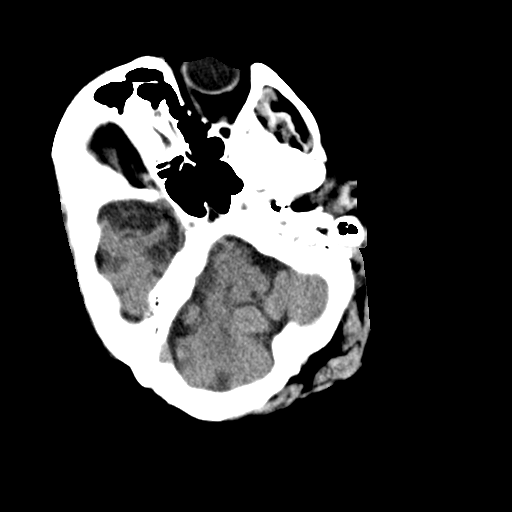
[im 9/33  brain]
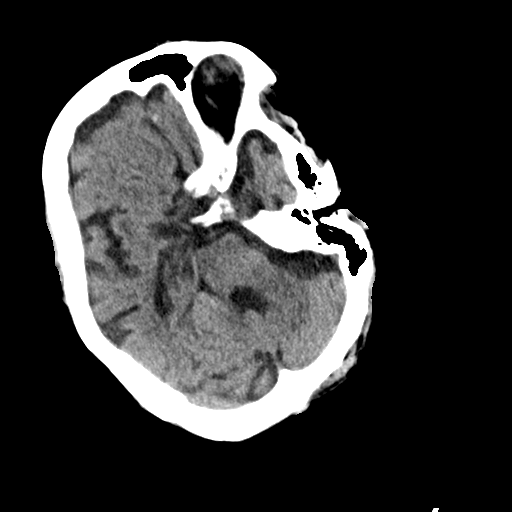
[im 13/33  brain]
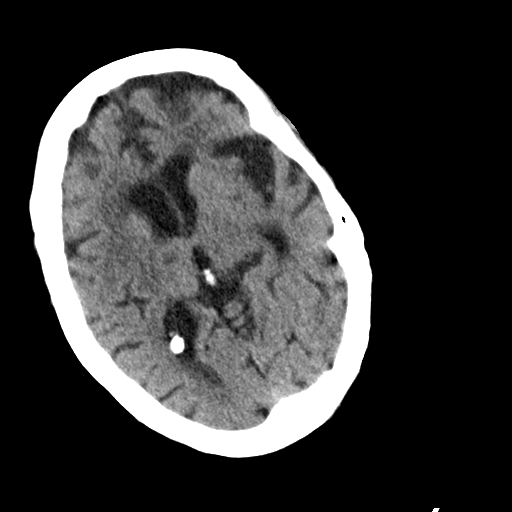
[im 17/33  brain]
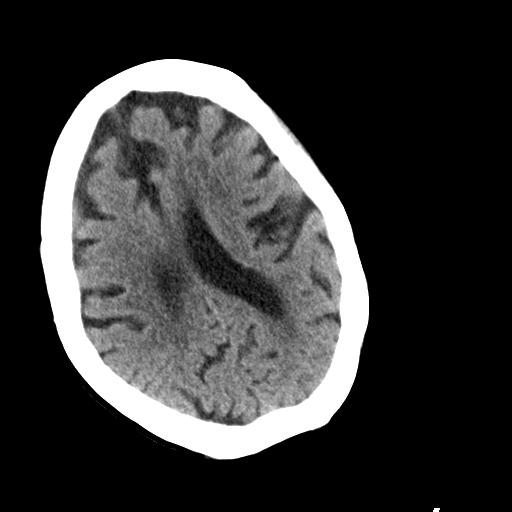
[im 17/33  bone]
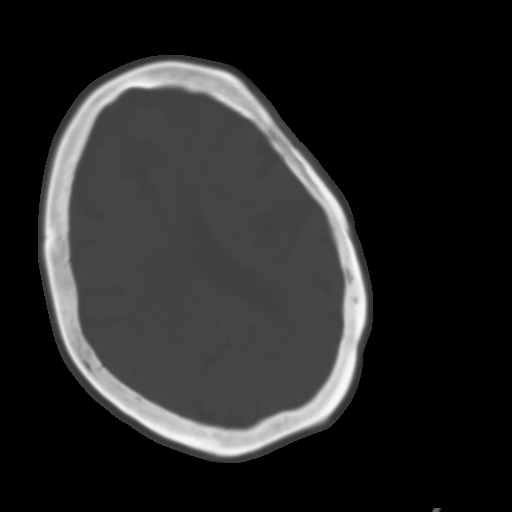
[im 20/33  brain]
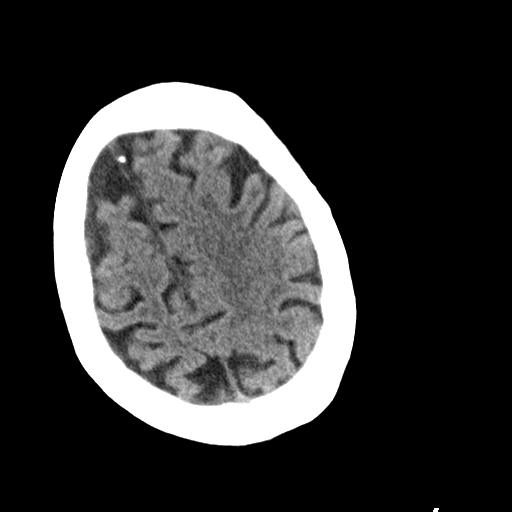
[im 24/33  brain]
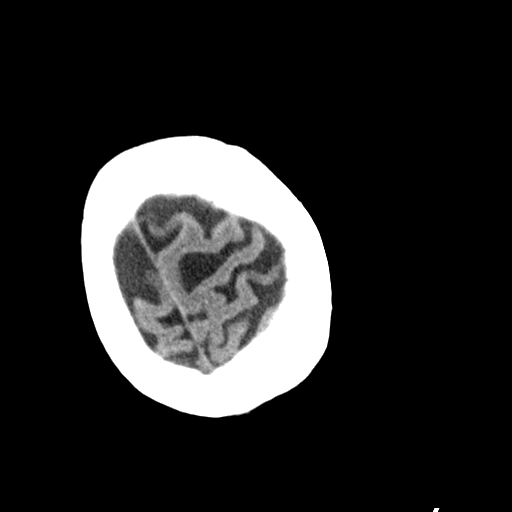
[im 27/33  brain]
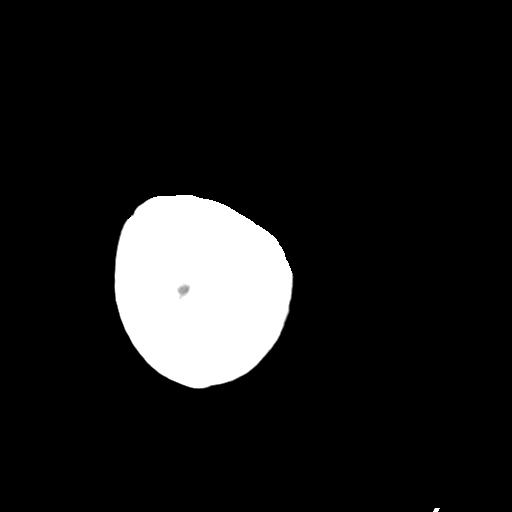
[im 30/33  brain]
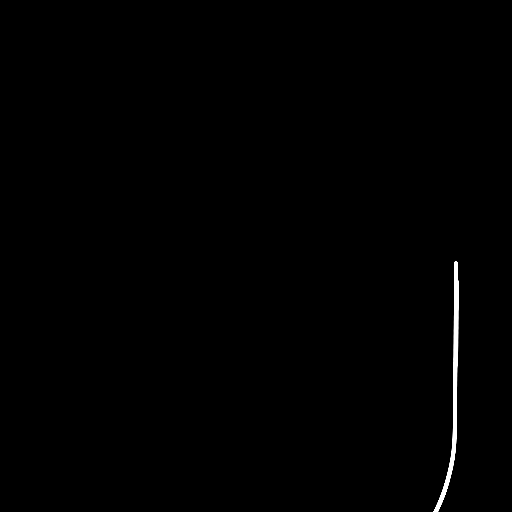
[im 30/33  bone]
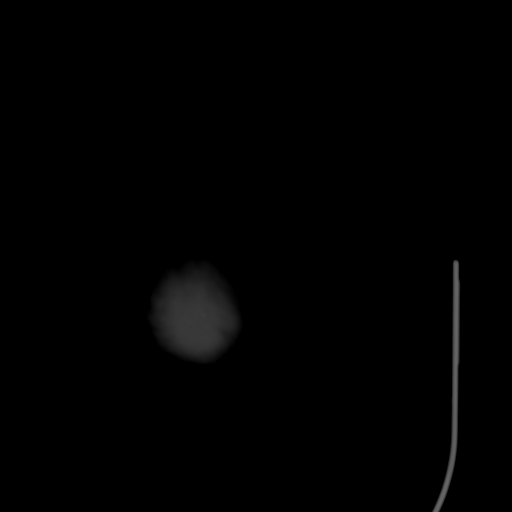

[Series 5: head 3.0 cor st · coronal · 0.33mm/px · 3 of 73 slices shown]
[im 25/73  brain]
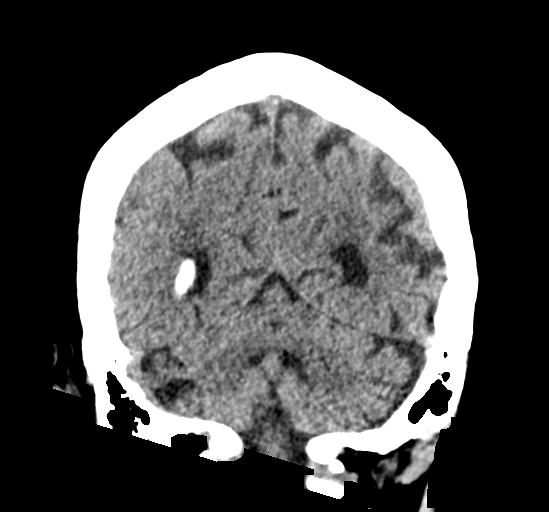
[im 33/73  brain]
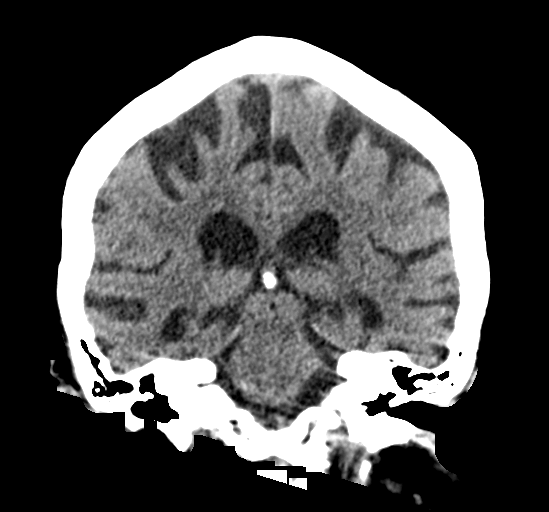
[im 41/73  brain]
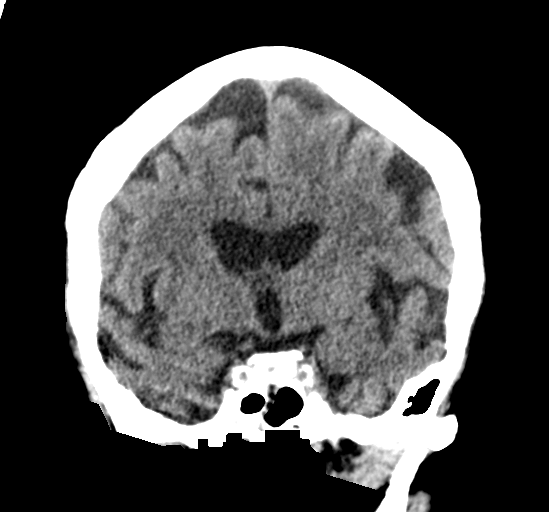

[Series 6: head 3.0 sag st · sagittal · 0.33mm/px · 3 of 57 slices shown]
[im 26/57  brain]
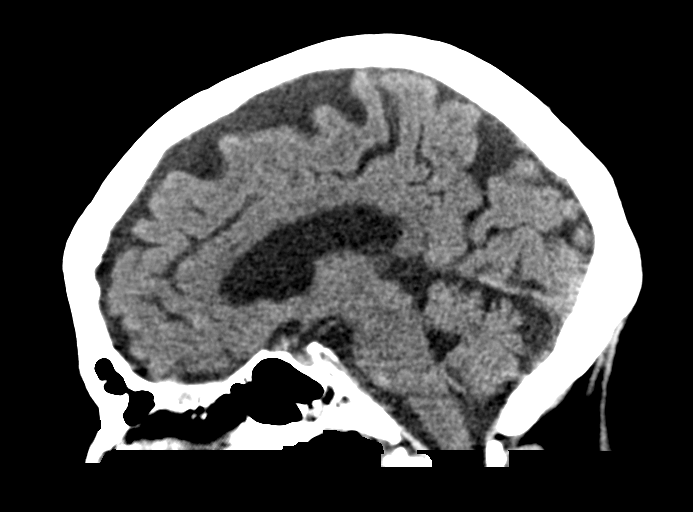
[im 29/57  brain]
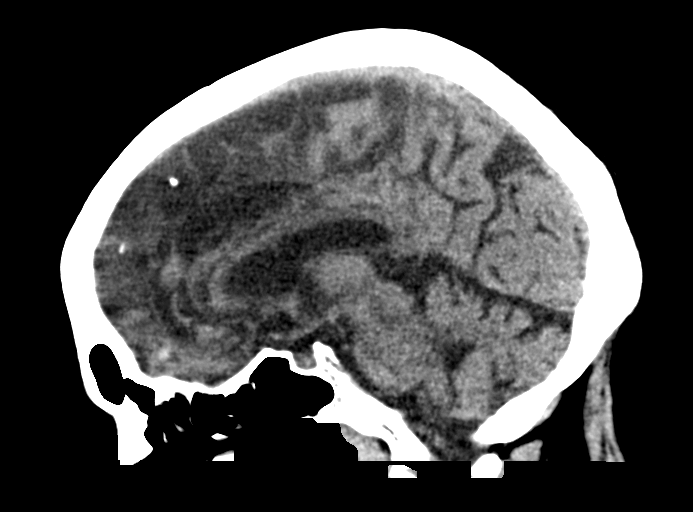
[im 31/57  brain]
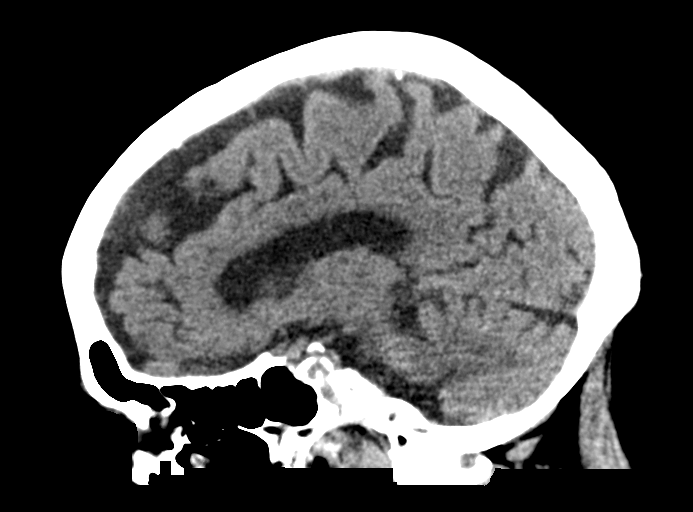

[15 of 47 positions shown; findings below may reference images not displayed]

FINDINGS: Brain: Generalized atrophy with frontal and temporal predominance.
No sign of acute infarction, mass lesion, hemorrhage, hydrocephalus
or extra-axial collection. Old lacunar infarction right thalamus.

Vascular: There is atherosclerotic calcification of the major
vessels at the base of the brain.

Skull: Negative

Sinuses/Orbits: Clear/normal

Other: None

ASPECTS (Alberta Stroke Program Early CT Score)

- Ganglionic level infarction (caudate, lentiform nuclei, internal
capsule, insula, M1-M3 cortex): 7

- Supraganglionic infarction (M4-M6 cortex): 3

Total score (0-10 with 10 being normal): 10
IMPRESSION: 1. No acute finding by CT. Generalized atrophy, frontal and temporal
predominant. Old lacunar infarction right thalamus.
2. ASPECTS is 10.
3. These results were called by telephone at the time of
interpretation on 08/05/2018 at [DATE] to Dr. KALYAN CHAKRAVHARTHI NANALES , who
verbally acknowledged these results.

## 2020-09-04 IMAGING — MR MR HEAD WO/W CM
11 of 13 series · 38 of 48 positions shown · IV contrast (gadavist)
Comparison: CTA head and neck 08/05/2018

CLINICAL DATA: Right facial droop and altered mental status.

EXAM:
MRI HEAD WITHOUT AND WITH CONTRAST
TECHNIQUE: Multiplanar, multiecho pulse sequences of the brain and surrounding
structures were obtained without and with intravenous contrast.
CONTRAST:  7 mL Gadavist

[Series 5: DWI · axial · 4.0mm · 0.92mm/px · z∈[-117,+23]mm · 6 of 74 slices shown (1 of 4)]
[im 1/74]
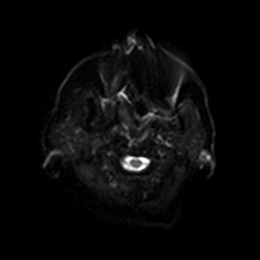
[im 15/74]
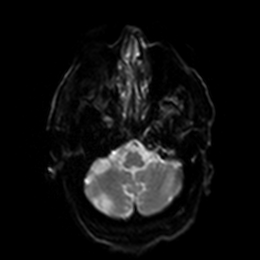
[im 30/74]
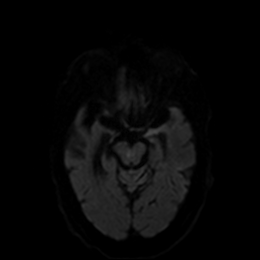
[im 44/74]
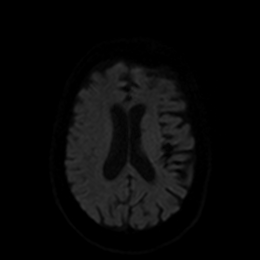
[im 59/74]
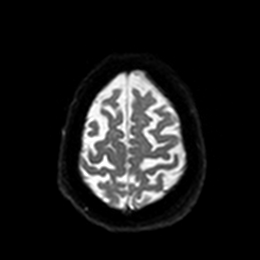
[im 74/74]
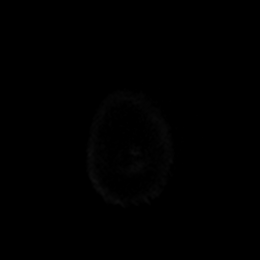

[Series 6: DWI · axial · 4.0mm · 0.92mm/px · z∈[-117,+23]mm · 3 of 37 slices shown (2 of 4)]
[im 1/37]
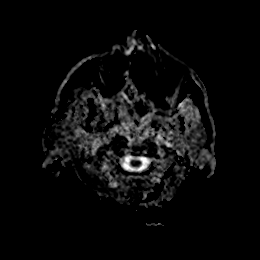
[im 19/37]
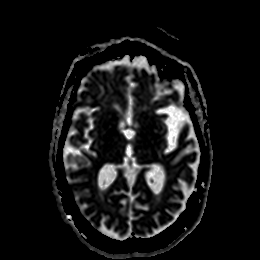
[im 37/37]
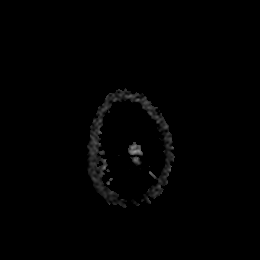

[Series 7: FLAIR · axial · 5.0mm · 0.47mm/px · z∈[-115,+24]mm · 2 of 25 slices shown]
[im 1/25]
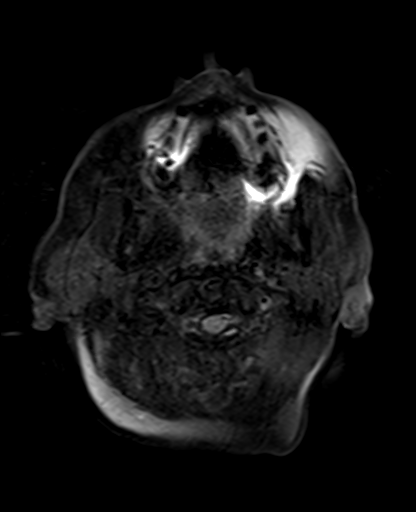
[im 25/25]
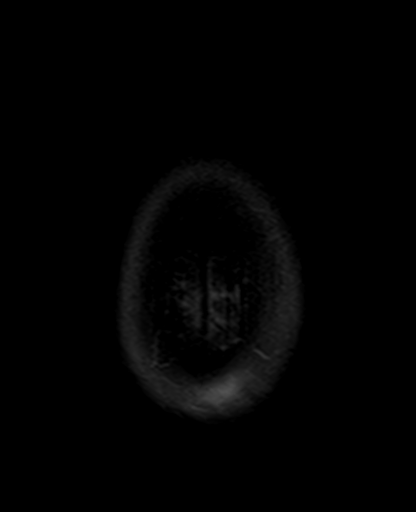

[Series 8: DWI · coronal · 4.0mm · 0.88mm/px · 6 of 64 slices shown (3 of 4)]
[im 1/64]
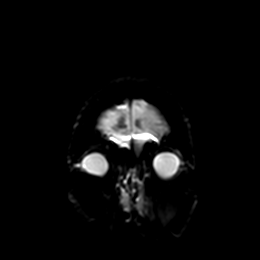
[im 13/64]
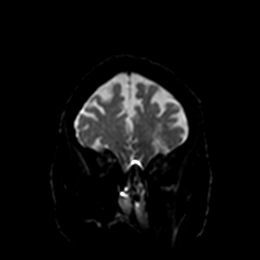
[im 26/64]
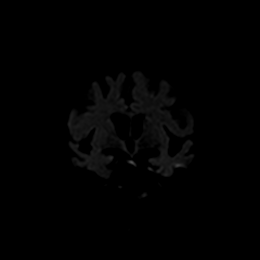
[im 38/64]
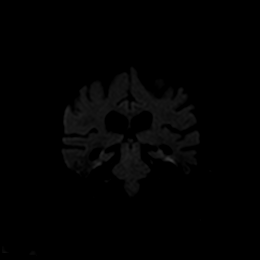
[im 51/64]
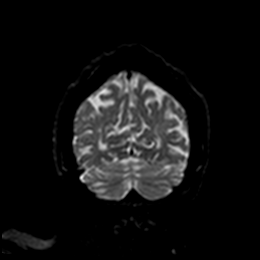
[im 64/64]
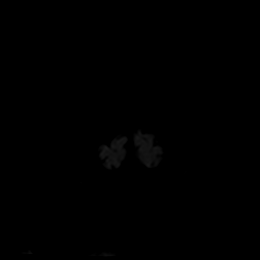

[Series 9: DWI · coronal · 4.0mm · 0.88mm/px · 3 of 32 slices shown (4 of 4)]
[im 1/32]
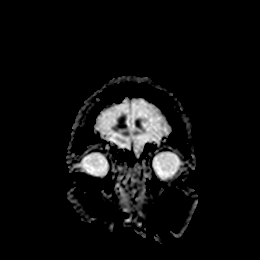
[im 16/32]
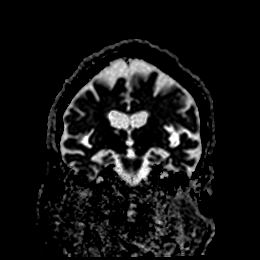
[im 32/32]
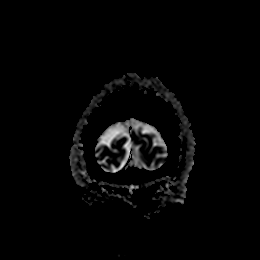

[Series 10: T2 · axial · 5.0mm · 0.75mm/px · z∈[-121,+18]mm · 2 of 25 slices shown (1 of 2)]
[im 1/25]
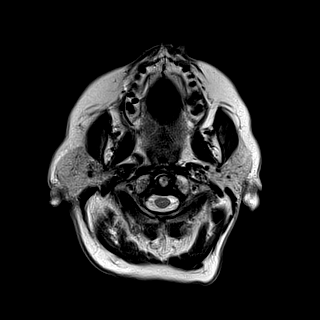
[im 25/25]
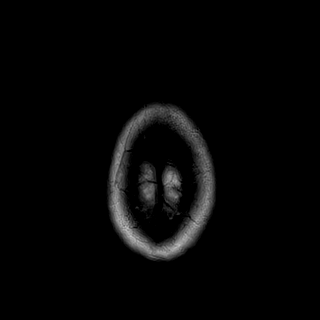

[Series 11: swi_images · axial · 3.0mm · 0.94mm/px · z∈[-122,+25]mm · 5 of 52 slices shown]
[im 1/52]
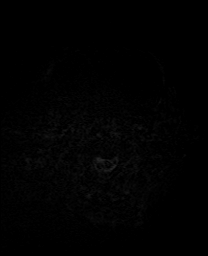
[im 13/52]
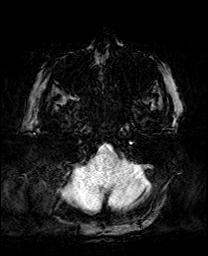
[im 26/52]
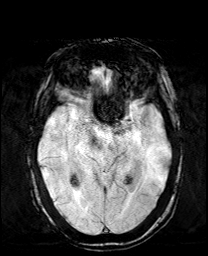
[im 39/52]
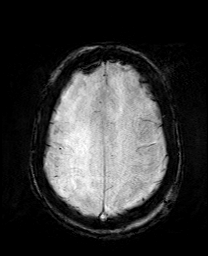
[im 52/52]
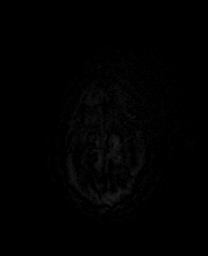

[Series 12: mip_images(sw) · axial · 24.0mm · 0.94mm/px · z∈[-112,+15]mm · 4 of 45 slices shown]
[im 1/45]
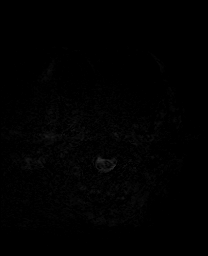
[im 15/45]
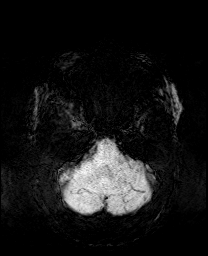
[im 30/45]
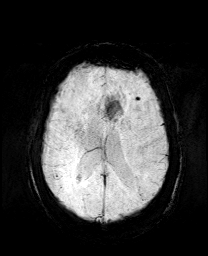
[im 45/45]
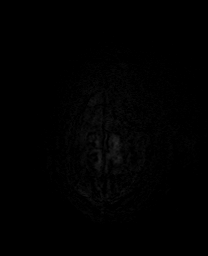

[Series 15: T1 · sagittal · 5.0mm · 0.75mm/px · 2 of 23 slices shown]
[im 1/23]
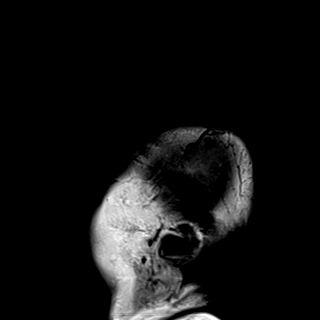
[im 23/23]
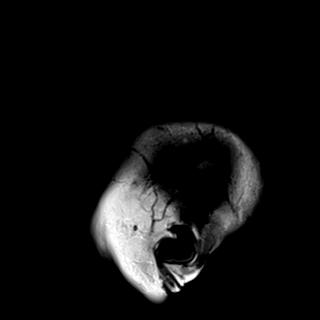

[Series 17: T2 · coronal · 5.0mm · 0.34mm/px · 2 of 27 slices shown (2 of 2)]
[im 1/27]
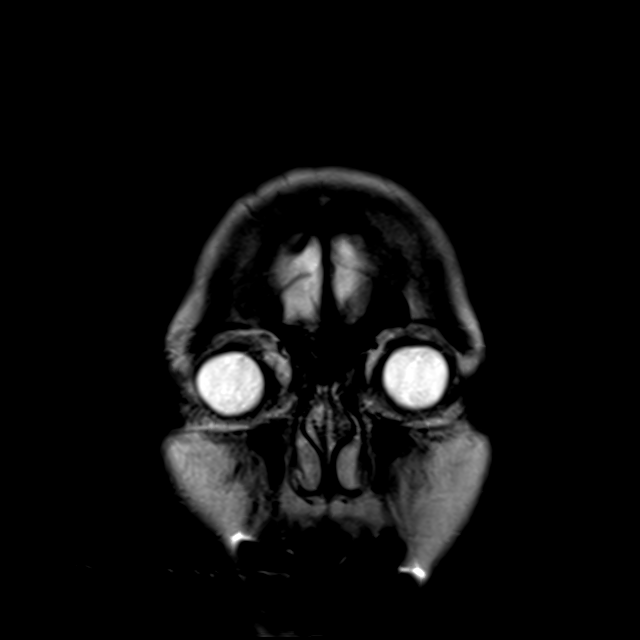
[im 27/27]
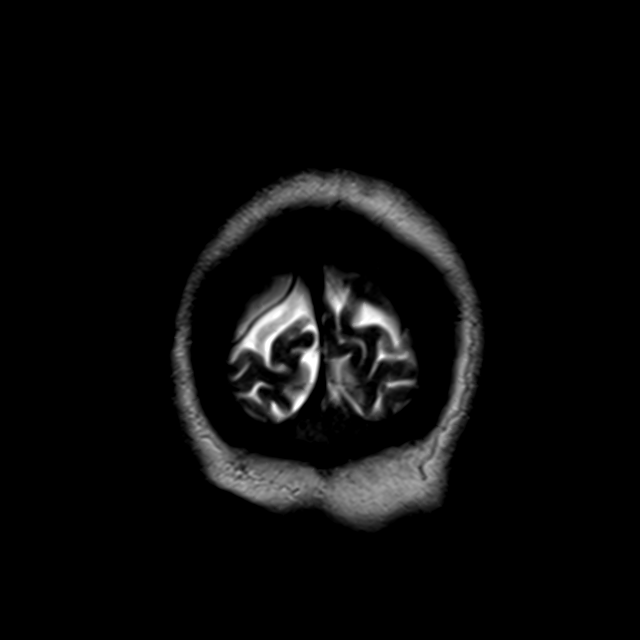

[Series 19: T1 post-contrast · coronal · 5.0mm · 0.34mm/px · 3 of 28 slices shown]
[im 1/28]
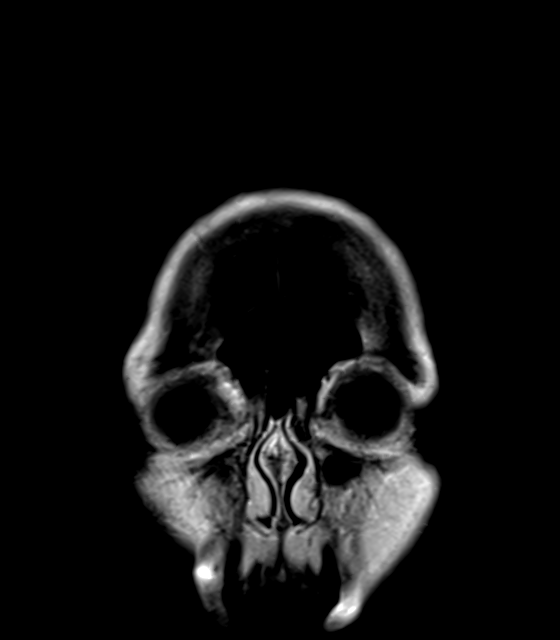
[im 14/28]
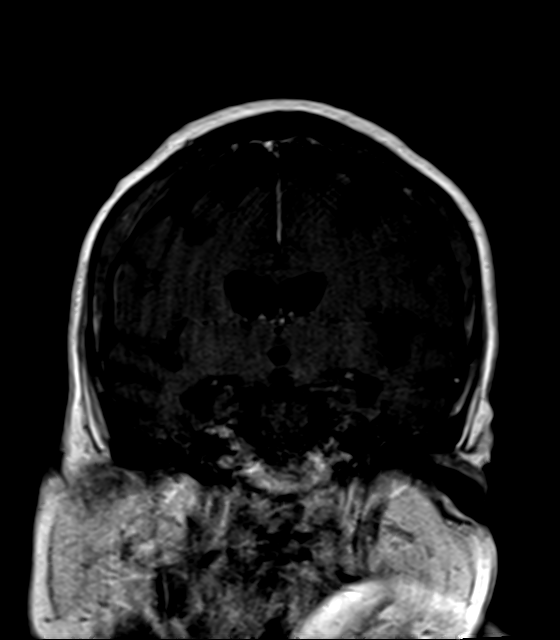
[im 28/28]
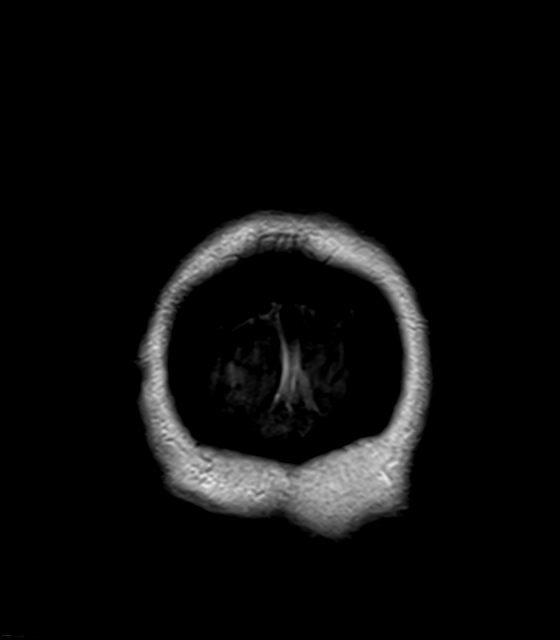

[38 of 48 positions shown; findings below may reference images not displayed]

FINDINGS: BRAIN: Small focus of abnormal diffusion restriction within the
medial left thalamus. No other diffusion abnormality. The midline
structures are normal. No midline shift or other mass effect.
Multiple old cerebellar infarcts. Old infarcts of the left basal
ganglia and right thalamus. Multifocal white matter hyperintensity,
most commonly due to chronic ischemic microangiopathy. Generalized
atrophy without lobar predilection. Scattered chronic
microhemmorhages in a peripheral predominant distribution. No

VASCULAR: Major intracranial arterial and venous sinus flow voids
are normal.

SKULL AND UPPER CERVICAL SPINE: Calvarial bone marrow signal is
normal. There is no skull base mass. Visualized upper cervical spine
and soft tissues are normal.

SINUSES/ORBITS: No fluid levels or advanced mucosal thickening. No
mastoid or middle ear effusion. The orbits are normal.
IMPRESSION: 1. Small acute or early subacute infarct of the medial right
thalamus. No hemorrhage or mass effect.
2. Chronic ischemic microangiopathy and generalized atrophy.
3. 3-5 scattered chronic microhemorrhages in a peripheral
distribution, nonspecific.
